# Patient Record
Sex: Female | Born: 1945 | Race: White | Hispanic: No | Marital: Married | Smoking: Never smoker
Health system: Southern US, Community
[De-identification: ages and names within clinical notes are randomized; demographics above are authoritative.]

## PROBLEM LIST (undated history)

## (undated) DIAGNOSIS — C4492 Squamous cell carcinoma of skin, unspecified: Secondary | ICD-10-CM

## (undated) DIAGNOSIS — M7750 Other enthesopathy of unspecified foot: Secondary | ICD-10-CM

## (undated) DIAGNOSIS — J45909 Unspecified asthma, uncomplicated: Secondary | ICD-10-CM

## (undated) DIAGNOSIS — M722 Plantar fascial fibromatosis: Secondary | ICD-10-CM

## (undated) DIAGNOSIS — T7840XA Allergy, unspecified, initial encounter: Secondary | ICD-10-CM

## (undated) DIAGNOSIS — I10 Essential (primary) hypertension: Secondary | ICD-10-CM

## (undated) DIAGNOSIS — E785 Hyperlipidemia, unspecified: Secondary | ICD-10-CM

## (undated) DIAGNOSIS — N3946 Mixed incontinence: Secondary | ICD-10-CM

## (undated) DIAGNOSIS — M1990 Unspecified osteoarthritis, unspecified site: Secondary | ICD-10-CM

## (undated) DIAGNOSIS — R739 Hyperglycemia, unspecified: Secondary | ICD-10-CM

## (undated) DIAGNOSIS — E669 Obesity, unspecified: Secondary | ICD-10-CM

## (undated) DIAGNOSIS — C439 Malignant melanoma of skin, unspecified: Secondary | ICD-10-CM

## (undated) HISTORY — DX: Hyperglycemia, unspecified: R73.9

## (undated) HISTORY — DX: Hyperlipidemia, unspecified: E78.5

## (undated) HISTORY — DX: Squamous cell carcinoma of skin, unspecified: C44.92

## (undated) HISTORY — PX: BREAST EXCISIONAL BIOPSY: SUR124

## (undated) HISTORY — DX: Allergy, unspecified, initial encounter: T78.40XA

## (undated) HISTORY — DX: Unspecified asthma, uncomplicated: J45.909

## (undated) HISTORY — DX: Other enthesopathy of unspecified foot and ankle: M77.50

## (undated) HISTORY — DX: Plantar fascial fibromatosis: M72.2

## (undated) HISTORY — DX: Unspecified osteoarthritis, unspecified site: M19.90

## (undated) HISTORY — DX: Obesity, unspecified: E66.9

## (undated) HISTORY — DX: Essential (primary) hypertension: I10

## (undated) HISTORY — DX: Malignant melanoma of skin, unspecified: C43.9

## (undated) HISTORY — DX: Mixed incontinence: N39.46

## (undated) HISTORY — PX: ABDOMINAL HYSTERECTOMY: SHX81

---

## 2000-01-17 ENCOUNTER — Other Ambulatory Visit: Admission: RE | Admit: 2000-01-17 | Discharge: 2000-01-17 | Payer: Self-pay | Admitting: Family Medicine

## 2003-01-16 ENCOUNTER — Encounter: Payer: Self-pay | Admitting: Family Medicine

## 2003-01-16 ENCOUNTER — Other Ambulatory Visit: Admission: RE | Admit: 2003-01-16 | Discharge: 2003-01-16 | Payer: Self-pay | Admitting: Family Medicine

## 2004-11-25 ENCOUNTER — Ambulatory Visit: Payer: Self-pay | Admitting: Family Medicine

## 2005-01-20 ENCOUNTER — Ambulatory Visit: Payer: Self-pay | Admitting: Family Medicine

## 2005-02-02 ENCOUNTER — Ambulatory Visit: Payer: Self-pay | Admitting: Family Medicine

## 2005-07-21 ENCOUNTER — Ambulatory Visit: Payer: Self-pay | Admitting: Family Medicine

## 2005-09-21 ENCOUNTER — Ambulatory Visit: Payer: Self-pay | Admitting: Family Medicine

## 2005-10-12 ENCOUNTER — Ambulatory Visit: Payer: Self-pay | Admitting: Family Medicine

## 2006-01-03 HISTORY — PX: TENDON REPAIR: SHX5111

## 2006-01-23 ENCOUNTER — Ambulatory Visit: Payer: Self-pay | Admitting: Family Medicine

## 2006-08-07 ENCOUNTER — Ambulatory Visit: Payer: Self-pay | Admitting: Family Medicine

## 2006-12-20 ENCOUNTER — Encounter: Payer: Self-pay | Admitting: Family Medicine

## 2006-12-20 DIAGNOSIS — I10 Essential (primary) hypertension: Secondary | ICD-10-CM

## 2006-12-20 DIAGNOSIS — N39498 Other specified urinary incontinence: Secondary | ICD-10-CM | POA: Insufficient documentation

## 2006-12-20 DIAGNOSIS — M722 Plantar fascial fibromatosis: Secondary | ICD-10-CM | POA: Insufficient documentation

## 2006-12-20 DIAGNOSIS — R42 Dizziness and giddiness: Secondary | ICD-10-CM

## 2007-01-23 ENCOUNTER — Other Ambulatory Visit: Admission: RE | Admit: 2007-01-23 | Discharge: 2007-01-23 | Payer: Self-pay | Admitting: Family Medicine

## 2007-01-23 ENCOUNTER — Encounter (INDEPENDENT_AMBULATORY_CARE_PROVIDER_SITE_OTHER): Payer: Self-pay | Admitting: *Deleted

## 2007-01-23 ENCOUNTER — Encounter: Payer: Self-pay | Admitting: Family Medicine

## 2007-01-23 ENCOUNTER — Ambulatory Visit: Payer: Self-pay | Admitting: Family Medicine

## 2007-01-23 DIAGNOSIS — J309 Allergic rhinitis, unspecified: Secondary | ICD-10-CM | POA: Insufficient documentation

## 2007-01-23 LAB — CONVERTED CEMR LAB: Pap Smear: NORMAL

## 2007-01-24 LAB — CONVERTED CEMR LAB
AST: 21 units/L (ref 0–37)
Albumin: 4.1 g/dL (ref 3.5–5.2)
Alkaline Phosphatase: 52 units/L (ref 39–117)
BUN: 20 mg/dL (ref 6–23)
CO2: 33 meq/L — ABNORMAL HIGH (ref 19–32)
Calcium: 10.1 mg/dL (ref 8.4–10.5)
Chloride: 103 meq/L (ref 96–112)
Cholesterol: 205 mg/dL (ref 0–200)
Creatinine, Ser: 1 mg/dL (ref 0.4–1.2)
Direct LDL: 141.9 mg/dL
HCT: 40.2 % (ref 36.0–46.0)
Hemoglobin: 13.9 g/dL (ref 12.0–15.0)
MCHC: 34.5 g/dL (ref 30.0–36.0)
Monocytes Relative: 7.6 % (ref 3.0–11.0)
Neutrophils Relative %: 61.4 % (ref 43.0–77.0)
RBC: 4.38 M/uL (ref 3.87–5.11)
RDW: 11.9 % (ref 11.5–14.6)
Sodium: 141 meq/L (ref 135–145)
Total Bilirubin: 1 mg/dL (ref 0.3–1.2)
Total CHOL/HDL Ratio: 5
Total Protein: 7.1 g/dL (ref 6.0–8.3)

## 2007-01-30 ENCOUNTER — Encounter (INDEPENDENT_AMBULATORY_CARE_PROVIDER_SITE_OTHER): Payer: Self-pay | Admitting: *Deleted

## 2007-02-06 ENCOUNTER — Ambulatory Visit: Payer: Self-pay | Admitting: Family Medicine

## 2007-02-06 ENCOUNTER — Encounter: Payer: Self-pay | Admitting: Family Medicine

## 2007-02-08 ENCOUNTER — Encounter (INDEPENDENT_AMBULATORY_CARE_PROVIDER_SITE_OTHER): Payer: Self-pay | Admitting: *Deleted

## 2007-05-17 ENCOUNTER — Ambulatory Visit: Payer: Self-pay | Admitting: Family Medicine

## 2007-05-17 DIAGNOSIS — R739 Hyperglycemia, unspecified: Secondary | ICD-10-CM | POA: Insufficient documentation

## 2007-05-17 DIAGNOSIS — E7800 Pure hypercholesterolemia, unspecified: Secondary | ICD-10-CM

## 2007-05-21 LAB — CONVERTED CEMR LAB
ALT: 24 units/L (ref 0–35)
AST: 22 units/L (ref 0–37)
Cholesterol: 182 mg/dL (ref 0–200)
HDL: 39 mg/dL (ref >39.0)
Hgb A1c MFr Bld: 6 % (ref 4.6–6.0)

## 2007-08-29 ENCOUNTER — Telehealth: Payer: Self-pay | Admitting: Family Medicine

## 2007-10-26 ENCOUNTER — Telehealth: Payer: Self-pay | Admitting: Family Medicine

## 2008-01-30 ENCOUNTER — Ambulatory Visit: Payer: Self-pay | Admitting: Family Medicine

## 2008-02-05 ENCOUNTER — Ambulatory Visit: Payer: Self-pay | Admitting: Family Medicine

## 2008-02-08 LAB — CONVERTED CEMR LAB
Albumin: 4.2 g/dL (ref 3.5–5.2)
Alkaline Phosphatase: 49 units/L (ref 39–117)
Calcium: 10.3 mg/dL (ref 8.4–10.5)
Cholesterol: 181 mg/dL (ref 0–200)
Eosinophils Absolute: 0.2 10*3/uL (ref 0.0–0.7)
Eosinophils Relative: 4.7 % (ref 0.0–5.0)
Glucose, Bld: 109 mg/dL — ABNORMAL HIGH (ref 70–99)
HDL: 38.2 mg/dL — ABNORMAL LOW (ref >39.0)
Hgb A1c MFr Bld: 6.1 % — ABNORMAL HIGH (ref 4.6–6.0)
Neutro Abs: 3.2 10*3/uL (ref 1.4–7.7)
Neutrophils Relative %: 59.2 % (ref 43.0–77.0)
Platelets: 271 10*3/uL (ref 150–400)
Potassium: 3.8 meq/L (ref 3.5–5.1)
RBC: 4.29 M/uL (ref 3.87–5.11)
Sodium: 141 meq/L (ref 135–145)
TSH: 2.57 microintl units/mL (ref 0.35–5.50)
Total Bilirubin: 1.3 mg/dL — ABNORMAL HIGH (ref 0.3–1.2)
Triglycerides: 94 mg/dL (ref 0–149)

## 2008-02-22 ENCOUNTER — Ambulatory Visit: Payer: Self-pay | Admitting: Family Medicine

## 2008-02-22 LAB — CONVERTED CEMR LAB
OCCULT 1: NEGATIVE
OCCULT 2: NEGATIVE

## 2008-02-26 ENCOUNTER — Encounter: Payer: Self-pay | Admitting: Family Medicine

## 2008-02-26 ENCOUNTER — Ambulatory Visit: Payer: Self-pay | Admitting: Family Medicine

## 2008-03-11 ENCOUNTER — Encounter (INDEPENDENT_AMBULATORY_CARE_PROVIDER_SITE_OTHER): Payer: Self-pay | Admitting: *Deleted

## 2008-05-13 ENCOUNTER — Telehealth: Payer: Self-pay | Admitting: Family Medicine

## 2008-07-08 ENCOUNTER — Ambulatory Visit: Payer: Self-pay | Admitting: Family Medicine

## 2008-07-09 LAB — CONVERTED CEMR LAB
Cholesterol: 188 mg/dL (ref 0–200)
HDL: 42.2 mg/dL (ref >39.0)
LDL Cholesterol: 129 mg/dL — ABNORMAL HIGH (ref 0–99)
Total CHOL/HDL Ratio: 4.5
Triglycerides: 82 mg/dL (ref 0–149)
VLDL: 16 mg/dL (ref 0–40)

## 2008-11-25 ENCOUNTER — Telehealth: Payer: Self-pay | Admitting: Family Medicine

## 2009-01-06 ENCOUNTER — Ambulatory Visit: Payer: Self-pay | Admitting: Family Medicine

## 2009-02-05 ENCOUNTER — Telehealth: Payer: Self-pay | Admitting: Family Medicine

## 2009-03-20 ENCOUNTER — Ambulatory Visit: Payer: Self-pay | Admitting: Family Medicine

## 2009-03-20 LAB — CONVERTED CEMR LAB
ALT: 20 units/L (ref 0–35)
AST: 24 units/L (ref 0–37)
Alkaline Phosphatase: 54 units/L (ref 39–117)
Basophils Relative: 0.5 % (ref 0.0–3.0)
Bilirubin, Direct: 0.1 mg/dL (ref 0.0–0.3)
Chloride: 101 meq/L (ref 96–112)
Creatinine, Ser: 1.1 mg/dL (ref 0.4–1.2)
Eosinophils Relative: 2.9 % (ref 0.0–5.0)
LDL Cholesterol: 125 mg/dL — ABNORMAL HIGH (ref 0–99)
Lymphocytes Relative: 22 % (ref 12.0–46.0)
MCV: 90.8 fL (ref 78.0–100.0)
Monocytes Absolute: 0.5 10*3/uL (ref 0.1–1.0)
Neutrophils Relative %: 67.1 % (ref 43.0–77.0)
RBC: 4.5 M/uL (ref 3.87–5.11)
Total CHOL/HDL Ratio: 4
Total Protein: 7.4 g/dL (ref 6.0–8.3)
Triglycerides: 72 mg/dL (ref 0.0–149.0)
WBC: 6.5 10*3/uL (ref 4.5–10.5)

## 2009-03-24 ENCOUNTER — Ambulatory Visit: Payer: Self-pay | Admitting: Family Medicine

## 2009-03-24 ENCOUNTER — Encounter: Payer: Self-pay | Admitting: Family Medicine

## 2009-03-26 ENCOUNTER — Encounter (INDEPENDENT_AMBULATORY_CARE_PROVIDER_SITE_OTHER): Payer: Self-pay | Admitting: *Deleted

## 2009-06-24 ENCOUNTER — Ambulatory Visit: Payer: Self-pay | Admitting: Family Medicine

## 2009-06-25 LAB — CONVERTED CEMR LAB
Cholesterol: 174 mg/dL (ref 0–200)
HDL: 37.9 mg/dL — ABNORMAL LOW (ref >39.00)
LDL Cholesterol: 120 mg/dL — ABNORMAL HIGH (ref 0–99)
Triglycerides: 83 mg/dL (ref 0.0–149.0)
VLDL: 16.6 mg/dL (ref 0.0–40.0)

## 2009-07-10 ENCOUNTER — Ambulatory Visit: Payer: Self-pay | Admitting: Family Medicine

## 2009-08-21 ENCOUNTER — Telehealth: Payer: Self-pay | Admitting: Family Medicine

## 2009-08-21 ENCOUNTER — Ambulatory Visit: Payer: Self-pay | Admitting: Family Medicine

## 2009-08-24 LAB — CONVERTED CEMR LAB
ALT: 21 units/L (ref 0–35)
AST: 23 units/L (ref 0–37)
LDL Cholesterol: 68 mg/dL (ref 0–99)
VLDL: 10.6 mg/dL (ref 0.0–40.0)

## 2009-12-02 ENCOUNTER — Telehealth: Payer: Self-pay | Admitting: Family Medicine

## 2010-03-15 ENCOUNTER — Ambulatory Visit: Payer: Self-pay | Admitting: Family Medicine

## 2010-03-15 ENCOUNTER — Telehealth (INDEPENDENT_AMBULATORY_CARE_PROVIDER_SITE_OTHER): Payer: Self-pay | Admitting: *Deleted

## 2010-03-15 LAB — CONVERTED CEMR LAB
AST: 18 units/L (ref 0–37)
Albumin: 4.1 g/dL (ref 3.5–5.2)
BUN: 20 mg/dL (ref 6–23)
Basophils Relative: 0.6 % (ref 0.0–3.0)
CO2: 35 meq/L — ABNORMAL HIGH (ref 19–32)
Cholesterol: 125 mg/dL (ref 0–200)
Creatinine, Ser: 1.1 mg/dL (ref 0.4–1.2)
Eosinophils Absolute: 0.3 10*3/uL (ref 0.0–0.7)
GFR calc non Af Amer: 53.45 mL/min (ref >60)
HCT: 37.7 % (ref 36.0–46.0)
LDL Cholesterol: 70 mg/dL (ref 0–99)
Lymphs Abs: 1.6 10*3/uL (ref 0.7–4.0)
MCHC: 34.4 g/dL (ref 30.0–36.0)
MCV: 93.4 fL (ref 78.0–100.0)
Monocytes Absolute: 0.4 10*3/uL (ref 0.1–1.0)
Neutro Abs: 3.2 10*3/uL (ref 1.4–7.7)
Neutrophils Relative %: 57.4 % (ref 43.0–77.0)
Phosphorus: 3.5 mg/dL (ref 2.3–4.6)
RBC: 4.04 M/uL (ref 3.87–5.11)
Total Protein: 6.7 g/dL (ref 6.0–8.3)
Triglycerides: 77 mg/dL (ref 0.0–149.0)

## 2010-03-22 ENCOUNTER — Ambulatory Visit: Payer: Self-pay | Admitting: Family Medicine

## 2010-03-25 ENCOUNTER — Encounter: Payer: Self-pay | Admitting: Family Medicine

## 2010-03-25 ENCOUNTER — Ambulatory Visit: Payer: Self-pay | Admitting: Internal Medicine

## 2010-03-29 ENCOUNTER — Ambulatory Visit: Payer: Self-pay | Admitting: Family Medicine

## 2010-03-29 LAB — FECAL OCCULT BLOOD, GUAIAC: Fecal Occult Blood: NEGATIVE

## 2010-03-30 ENCOUNTER — Ambulatory Visit: Payer: Self-pay | Admitting: Family Medicine

## 2010-03-30 ENCOUNTER — Encounter: Payer: Self-pay | Admitting: Family Medicine

## 2010-03-30 LAB — CONVERTED CEMR LAB: Fecal Occult Bld: NEGATIVE

## 2010-04-05 ENCOUNTER — Telehealth: Payer: Self-pay | Admitting: Family Medicine

## 2010-04-06 ENCOUNTER — Ambulatory Visit: Payer: Self-pay | Admitting: Family Medicine

## 2010-04-06 ENCOUNTER — Encounter (INDEPENDENT_AMBULATORY_CARE_PROVIDER_SITE_OTHER): Payer: Self-pay | Admitting: *Deleted

## 2010-04-08 LAB — CONVERTED CEMR LAB
Basophils Absolute: 0 10*3/uL (ref 0.0–0.1)
Eosinophils Absolute: 0.2 10*3/uL (ref 0.0–0.7)
HCT: 39 % (ref 36.0–46.0)
Hemoglobin: 13.4 g/dL (ref 12.0–15.0)
Lymphs Abs: 1.9 10*3/uL (ref 0.7–4.0)
MCHC: 34.3 g/dL (ref 30.0–36.0)
MCV: 92.7 fL (ref 78.0–100.0)
Monocytes Absolute: 0.7 10*3/uL (ref 0.1–1.0)
Neutro Abs: 7.4 10*3/uL (ref 1.4–7.7)
Platelets: 230 10*3/uL (ref 150.0–400.0)
RDW: 12.8 % (ref 11.5–14.6)
Uric Acid, Serum: 7.7 mg/dL — ABNORMAL HIGH (ref 2.4–7.0)

## 2010-05-03 ENCOUNTER — Ambulatory Visit: Payer: Self-pay | Admitting: Family Medicine

## 2010-05-03 ENCOUNTER — Encounter: Payer: Self-pay | Admitting: Family Medicine

## 2010-05-05 ENCOUNTER — Telehealth: Payer: Self-pay | Admitting: Family Medicine

## 2010-05-12 ENCOUNTER — Telehealth: Payer: Self-pay | Admitting: Family Medicine

## 2010-05-18 ENCOUNTER — Encounter: Payer: Self-pay | Admitting: Family Medicine

## 2010-05-24 ENCOUNTER — Telehealth: Payer: Self-pay | Admitting: Family Medicine

## 2010-09-14 ENCOUNTER — Telehealth: Payer: Self-pay | Admitting: Family Medicine

## 2010-09-17 ENCOUNTER — Encounter: Payer: Self-pay | Admitting: Family Medicine

## 2010-09-22 ENCOUNTER — Encounter: Payer: Self-pay | Admitting: Family Medicine

## 2010-09-29 ENCOUNTER — Encounter: Payer: Self-pay | Admitting: Family Medicine

## 2010-09-30 ENCOUNTER — Telehealth: Payer: Self-pay | Admitting: Family Medicine

## 2010-10-05 NOTE — Progress Notes (Signed)
----   Converted from flag ---- ---- 03/14/2010 8:51 PM, Colon Flattery Tower MD wrote: please check renal / hepatic/ lipid/ tsh/ cbc with diff / AIC for 401.1, 272 and hyperglycemia -thanks   ---- 03/12/2010 9:16 AM, Liane Comber CMA (AAMA) wrote: Pt is scheduled for cpx labs Monday, what labs to draw and dx codes? Thanks Tasha ------------------------------

## 2010-10-05 NOTE — Progress Notes (Signed)
Summary: Toe pain  Phone Note Call from Patient Call back at [phone]   Caller: Patient Call For: Judith Part MD Summary of Call: Patient called c/o problems with big toe on left foot. The toe is red, swollen and painful to walk on. Patient wants to know if you can work her in today. There are no appointments available today. Patient has been to [facility] in the past, but it has been over 5 years. Initial call taken by: Sydell Axon LPN,  April 05, 2010 8:54 AM  Follow-up for Phone Call        that sounds as though it may be gout  let me know if any fever or other symptoms  can she take nsaids? -- I see aleve on her allergy list - please find out rxn  Follow-up by: Judith Part MD,  April 05, 2010 11:28 AM  Additional Follow-up for Phone Call Additional follow up Details #1::        Patient notified as instructed by telephone. Pt sais she has not run a fever but her lt big toe feels hot to the touch.. No known injury. Pt said she cannot take Aleve or Naproxen (they make her feel like she is having a heart attack the indigestion pain is so terible.). Pt has been taking Ibuprofen 200mg  take 3-4 every 8 hrs as needed. Pt said Ibuprofen helps her rest at night but does not take away the pain in the toe when she tries to walk. Pt 's grandfather and pt's son has had hx of gout. [facility] if pharmacy needed. Pt will wait to hear what Dr Milinda Antis thinks.Lewanda Rife LPN  April 05, 2010 12:29 PM     Additional Follow-up for Phone Call Additional follow up Details #2::    I am going to tx her with colchicine( watch out for stomach upset)- then get her in for f/u with first avail physician tomorrow (please schedule)  if fever or any other symptoms in the meantime- may need to go to UC  px written on EMR for call in  Follow-up by: Judith Part MD,  April 05, 2010 12:43 PM  Additional Follow-up for Phone Call Additional follow up Details #3:: Details for  Additional Follow-up Action Taken: Patient notified as instructed by telephone. Pt has appt to see DR Milinda Antis 04/06/10 at 9:15am. [facility]ication [facility]oned to [facility] as instructed. Lewanda Rife LPN  April 05, 2010 12:56 PM   New/Updated Medications: COLCRYS 0.6 MG TABS (COLCHICINE) take 2 now with food and then 1 by mouth with food two times a day as needed gout pain Prescriptions: COLCRYS 0.6 MG TABS (COLCHICINE) take 2 now with food and then 1 by mouth with food two times a day as needed gout pain  #10 x 0   Entered and Authorized by:   Judith Part MD   Signed by:   Lewanda Rife LPN on 13/24/4010   Method used:   Telephoned to ...       [facility] [address] [phone]* (retail)       [address] [place], [place]  [id number]       Ph: [phone]       Fax: [phone]   RxID:   [id number]

## 2010-10-05 NOTE — Progress Notes (Signed)
Summary: nexium is working Hospital doctor from Patient Call back at [employer] [phone]   Caller: Patient Call For: Judith Part MD Summary of Call: Patient says that the nexium is working great. She says that she feels so much better. She is now asking if you want her to contine to take it. If so she will [facility] [address] Please call patient if rx called in.  Initial call taken by: Melody Comas,  May 05, 2010 4:18 PM  Follow-up for Phone Call        I'm glad continue to take it  px written on EMR for call in  Follow-up by: Judith Part MD,  May 05, 2010 4:24 PM  Additional Follow-up for Phone Call Additional follow up Details #1::        Patient notified as instructed by telephone. [facility] pharmacy as instructed. Lewanda Rife LPN  May 05, 2010 4:52 PM     New/Updated Medications: NEXIUM 40 MG CPDR (ESOMEPRAZOLE MAGNESIUM) 1 by mouth once daily Prescriptions: NEXIUM 40 MG CPDR (ESOMEPRAZOLE MAGNESIUM) 1 by mouth once daily  #30 x 11   Entered by:   Lewanda Rife LPN   Authorized by:   Judith Part MD   Signed by:   Lewanda Rife LPN on 09/81/1914   Method used:   Electronically to        [id number]       [address]       [place], [place]  [address]       Ph: [phone]       Fax: [phone]   RxID:   [id number]

## 2010-10-05 NOTE — Medication Information (Signed)
Summary: Prior Authorization & Approval for Nexium/[employer]  Prior Authorization & Approval for Nexium/[employer]BS   Imported By: Lanelle Bal 05/24/2010 13:30:44  _____________________________________________________________________  External Attachment:    Type:   Image     Comment:   External Document

## 2010-10-05 NOTE — Letter (Signed)
Summary: Results Follow up Letter  [place] at [facility]  [address] [place], [place] [address]   Phone: [phone]  Fax: [phone]    04/06/2010 MRN: [id number]    PIERRE CUMPTON [address] [place], [place]  [address]     Dear Ms. Kohls,  The following are the results of your recent test(s):  Test         Result    Pap Smear:        Normal _____  Not Normal _____ Comments: ______________________________________________________ Cholesterol: LDL(Bad cholesterol):         Your goal is less than:         HDL (Good cholesterol):       Your goal is more than: Comments:  ______________________________________________________ Mammogram:        Normal __x___  Not Normal _____ Comments:  Next mammogram due in one year __________________________________________________________________ Hemoccult:        Normal _____  Not normal _______ Comments:    _____________________________________________________________________ Other Tests:    We routinely do not discuss normal results over the telephone.  If you desire a copy of the results, or you have any questions about this information we can discuss them at your next office visit.   Sincerely,  Roxy Manns, MD

## 2010-10-05 NOTE — Miscellaneous (Signed)
Summary: Controlled Substance Agreement  Controlled Substance Agreement   Imported By: Lanelle Bal 05/12/2010 11:17:39  _____________________________________________________________________  External Attachment:    Type:   Image     Comment:   External Document

## 2010-10-05 NOTE — Progress Notes (Signed)
Summary: prior auth for nexium  Phone Note Call from Patient   Caller: [employer] Call For: Judith Part MD Summary of Call: Prior Auth is required for Nexium. Form is on your desk.  Initial call taken by: Melody Comas,  May 12, 2010 4:52 PM  Follow-up for Phone Call        form done and in nurse in box  Follow-up by: Judith Part MD,  May 12, 2010 5:25 PM  Additional Follow-up for Phone Call Additional follow up Details #1::        Completed form faxed to [phone] as instructed. Form given to [place].Lewanda Rife LPN  May 13, 2010 11:01 AM      Appended Document: prior auth for nexium Prior auth given for nexium, approval letter placed on doctor's desk for signature and scanning.

## 2010-10-05 NOTE — Assessment & Plan Note (Signed)
Summary: LT BIG TOE,RED,SWOLLEN WARM TO TOUCH AND PAINFUL/RI   Vital Signs:  Patient profile:   65 year old female Height:      62 inches Temp:     98.1 degrees F oral Pulse rate:   64 / minute Pulse rhythm:   regular BP sitting:   128 / 76  (left arm) Cuff size:   large  Vitals Entered By: Lewanda Rife LPN (April 06, 2010 9:22 AM) CC: Lt big toe red, swollen, painful and warm to the touch. this AM foot also hurts   History of Present Illness: pain started tuesday afternoon - noticed some L foot pain switched back to old shoes - got worse and worse  swelling and redness started 2-3 days ago  no ingrown nail or pus or fever  toe feels warm   now lateral foot is sore as well   took colchicine -- 2 at onset and 1 last night  ? if it helped - could not tell   no shellfish or aged foods  did eat hot dog at [employer] is on hct   does drink a fair amt of water   no injury to this   Allergies: 1)  ! * Aleve  Past History:  Past Medical History: Last updated: 01/30/2008 Hypertension obesity  hyperlipidemia hyperglycemia  all rhinitis  mixed incontinence plantar fasciiitis- with significant disability tendonitis L foot-- chronic foot pain  Past Surgical History: Last updated: 12/20/2006 Hysterectomy- fibroids, ovaries intact Dexa- normal (01/2001) Tendon rupture left foot (01/2006)  Family History: Last updated: 01/23/2007 mother MI, ?thyroid probs father with lung ca  Social History: Last updated: 01/30/2008 non smoker cannot exercise to chronic foot pain  no alcohol   Risk Factors: Smoking Status: never (12/20/2006)  Review of Systems General:  Denies chills, fatigue, fever, loss of appetite, and malaise. Eyes:  Denies blurring and eye irritation. CV:  Denies chest pain or discomfort, lightheadness, palpitations, and shortness of breath with exertion. Resp:  Denies cough, shortness of breath, and wheezing. GI:  Denies abdominal pain, change in bowel  habits, gas, and indigestion. MS:  Complains of joint pain, joint redness, and joint swelling; denies muscle aches and cramps. Derm:  Denies itching, lesion(s), poor wound healing, and rash. Neuro:  Denies numbness and tingling. Psych:  Denies anxiety and depression. Heme:  Denies abnormal bruising and bleeding.  Physical Exam  General:  overwt but well appearing Head:  normocephalic, atraumatic, and no abnormalities observed.   Eyes:  vision grossly intact, pupils equal, pupils round, and pupils reactive to light.  no conjunctival pallor, injection or icterus  Nose:  no nasal discharge.   Mouth:  pharynx pink and moist.   Neck:  supple with full rom and no masses or thyromegally, no JVD or carotid bruit  Chest Wall:  No deformities, masses, or tenderness noted. Lungs:  Normal respiratory effort, chest expands symmetrically. Lungs are clear to auscultation, no crackles or wheezes. Heart:  Normal rate and regular rhythm. S1 and S2 normal without gallop, murmur, click, rub or other extra sounds. Msk:  L foot - swelling over first MTP and also 5th MTP diffuse tenderness over those areas warm and red no drainage or ingrown nails   Pulses:  R and L carotid,radial,femoral,dorsalis pedis and posterior tibial pulses are full and equal bilaterally Extremities:  see above Neurologic:  sensation intact to light touch, gait normal, and DTRs symmetrical and normal.   Skin:  no rashes  Cervical Nodes:  No  lymphadenopathy noted Psych:  normal affect, talkative and pleasant    Impression & Recommendations:  Problem # 1:  FOOT PAIN (ICD-729.5) Assessment New suspect gout with toe and foot pain/ redness and swelling x ray today- also want to r/o stress fx  lab cbc and uric acid  cocrys as needed vicodin as needed severe pain elevate/ cool compress if tolerated  Orders: Venipuncture (32440) TLB-CBC Platelet - w/Differential (85025-CBCD) TLB-Uric Acid, Blood (84550-URIC) T-Foot Left Min 3  Views (73630TC)  Complete Medication List: 1)  Flonase 50 Mcg/act Susp (Fluticasone propionate) .... 2 sprays iin each nostril daily 2)  Diovan Hct 160-12.5 Mg Tabs (Valsartan-hydrochlorothiazide) .Marland Kitchen.. 1 by mouth once daily 3)  Vesicare 10 Mg Tabs (Solifenacin succinate) .Marland Kitchen.. 1 by mouth once daily 4)  Hydrochlorothiazide 25 Mg Tabs (Hydrochlorothiazide) .Marland Kitchen.. 1 by mouth once daily 5)  Lipitor 10 Mg Tabs (Atorvastatin calcium) .Marland Kitchen.. 1 by mouth once daily 6)  Mobic 7.5 Mg Tabs (Meloxicam) .Marland Kitchen.. 1 by mouth once daily with food 7)  Multivitamins Tabs (Multiple vitamin) .... Take 1 tablet by mouth once a day 8)  Calcium With Vitamin D ?mg  .... Take 1 tablet by mouth once a day 9)  Colcrys 0.6 Mg Tabs (Colchicine) .... Take 2 now with food and then 1 by mouth with food two times a day as needed gout pain 10)  Aspirin 81 Mg Tabs (Aspirin) .... Take 1 tablet by mouth once a day 11)  Vicodin 5-500 Mg Tabs (Hydrocodone-acetaminophen) .Marland Kitchen.. 1 by mouth up to every 4 hours as needed severe pain  Patient Instructions: 1)  x ray today  2)  labs today  3)  could be gout or stress fracture 4)  continue colchicine as needed - stop for GI upset  5)  vicodin for severe pain 6)  drink lots of water  7)  update me if worse  8)  I will update you with results  Prescriptions: VICODIN 5-500 MG TABS (HYDROCODONE-ACETAMINOPHEN) 1 by mouth up to every 4 hours as needed severe pain  #15 x 0   Entered and Authorized by:   Judith Part MD   Signed by:   Judith Part MD on 04/06/2010   Method used:   Print then Give to Patient   RxID:   [id number]   Current Allergies (reviewed today): ! * ALEVE

## 2010-10-05 NOTE — Progress Notes (Signed)
Summary: needs written script for mobic  Phone Note Refill Request Call back at [phone] Message from:  Patient  Refills Requested: Medication #1:  MOBIC 7.5 MG TABS 1 by mouth once daily with food Pt is asking for a written script for mail order, please call when ready and she will pick up.  Initial call taken by: Lowella Petties CMA,  December 02, 2009 1:59 PM  Follow-up for Phone Call        printed in put in nurse in box for pickup  Follow-up by: Judith Part MD,  December 02, 2009 5:06 PM  Additional Follow-up for Phone Call Additional follow up Details #1::        Patient notified as instructed by telephone. Prescription left at front desk. Lewanda Rife LPN  December 02, 2009 5:11 PM     New/Updated Medications: MOBIC 7.5 MG TABS (MELOXICAM) 1 by mouth once daily with food Prescriptions: MOBIC 7.5 MG TABS (MELOXICAM) 1 by mouth once daily with food  #90 x 3   Entered and Authorized by:   Judith Part MD   Signed by:   Judith Part MD on 12/02/2009   Method used:   Print then Give to Patient   RxID:   [id number]

## 2010-10-05 NOTE — Letter (Signed)
Summary: [facility]: Immunoassay Fecal Occult Blood (iFOB) Order Form  [place] [facility]  [address] [place], [place] [address]   Phone: [phone]  Fax: [phone]      [place] [facility]: Immunoassay Fecal Occult Blood (iFOB) Order Form   March 22, 2010 MRN: [id number]   Andrea Peters 01/28/1946   Physicican Name:__Tower_______________________  Diagnosis Code:_______V76.51___________________      Judith Part MD

## 2010-10-05 NOTE — Assessment & Plan Note (Signed)
Summary: PAIN AROUND RIBS   Vital Signs:  Patient profile:   65 year old female Height:      62 inches Weight:      193.25 pounds BMI:     35.47 Temp:     97.9 degrees F oral Pulse rate:   68 / minute Pulse rhythm:   regular BP sitting:   138 / 80  (left arm) Cuff size:   large  Vitals Entered By: Lewanda Rife LPN (May 03, 2010 12:01 PM) CC: pain at ribs on both sides for approx 1 week. No known injury even hurts to wear a bra, Headache   History of Present Illness: symptoms started less than a week ago  feels like extreme indigestion -- "all around rib cage"-- all under the breasts  radiates in back to the back of her shoulders  has not been taking meloxican 3 advil will ease it off (no help with pepcid and mylanta)  even hurts in breasts  worse in afternoon and eve had to stop wearing bra   throat feels fine  is very tender all around ribs -- to the touch -- in certain areas  is having a lot of burping and belching -- ? if it gives her some relief   no rash  no new activity   husband had MI  stressful is doing great  no heavy lifting    Allergies: 1)  ! * Aleve  Past History:  Past Medical History: Last updated: 01/30/2008 Hypertension obesity  hyperlipidemia hyperglycemia  all rhinitis  mixed incontinence plantar fasciiitis- with significant disability tendonitis L foot-- chronic foot pain  Past Surgical History: Last updated: 12/20/2006 Hysterectomy- fibroids, ovaries intact Dexa- normal (01/2001) Tendon rupture left foot (01/2006)  Family History: Last updated: 01/23/2007 mother MI, ?thyroid probs father with lung ca  Social History: Last updated: 01/30/2008 non smoker cannot exercise to chronic foot pain  no alcohol   Risk Factors: Smoking Status: never (12/20/2006)  Review of Systems General:  Complains of fatigue; denies chills, fever, loss of appetite, and malaise. Eyes:  Denies blurring. ENT:  Denies sore throat. CV:  Denies  chest pain or discomfort, lightheadness, and palpitations. Resp:  Denies cough, pleuritic, shortness of breath, and wheezing. GI:  Complains of gas and indigestion; denies bloody stools, change in bowel habits, nausea, and vomiting. GU:  Denies dysuria and urinary frequency. MS:  Complains of stiffness; denies joint redness, joint swelling, and muscle weakness. Derm:  Denies itching, lesion(s), poor wound healing, and rash. Neuro:  Denies numbness, tingling, and weakness. Psych:  stressed but doing ok . Endo:  Denies cold intolerance, excessive thirst, excessive urination, and heat intolerance. Heme:  Denies abnormal bruising and bleeding.  Physical Exam  General:  overwt but well appearing Head:  normocephalic, atraumatic, and no abnormalities observed.   Eyes:  vision grossly intact, pupils equal, pupils round, and pupils reactive to light.  no conjunctival pallor, injection or icterus  Ears:  R ear normal and L ear normal.   Nose:  no nasal discharge.   Mouth:  pharynx pink and moist.   Neck:  supple with full rom and no masses or thyromegally, no JVD or carotid bruit  Chest Wall:  tender bilat ant/lat ribs without crepitice or skin change Lungs:  Normal respiratory effort, chest expands symmetrically. Lungs are clear to auscultation, no crackles or wheezes. (no pain on deep inspiration) Heart:  Normal rate and regular rhythm. S1 and S2 normal without gallop, murmur, click, rub or  other extra sounds. Abdomen:  tender epigastrium without rebound or gaurding soft, normal bowel sounds, no distention, no masses, no hepatomegaly, and no splenomegaly.   Msk:  no CVA tenderness  no acute joint changes  Pulses:  R and L carotid,radial,femoral,dorsalis pedis and posterior tibial pulses are full and equal bilaterally Extremities:  No clubbing, cyanosis, edema, or deformity noted with normal full range of motion of all joints.   Neurologic:  strength normal in all extremities, sensation intact  to light touch, gait normal, and DTRs symmetrical and normal.   Skin:  Intact without suspicious lesions or rashes Cervical Nodes:  No lymphadenopathy noted Inguinal Nodes:  No significant adenopathy Psych:  normal affect, talkative and pleasant    Impression & Recommendations:  Problem # 1:  GASTRITIS (ICD-535.50) Assessment New epigastric pain with some burning -- after severe stress suspect gastritis  trial of nexium- samples given- and update later in week  adv to avoid nsaid  disc what to avoid in diet   Problem # 2:  CHEST WALL PAIN, ACUTE (BMW-413.24) Assessment: New bilat lower rib pain and tenderness ? costochondritis or other ? if rel to above cxr and update adv to use warm compress prn  Her updated medication list for this problem includes:    Mobic 7.5 Mg Tabs (Meloxicam) .Marland Kitchen... 1 by mouth once daily with food as needed.    Aspirin 81 Mg Tabs (Aspirin) .Marland Kitchen... Take 1 tablet by mouth once a day  Orders: T-2 View CXR (71020TC)  Complete Medication List: 1)  Flonase 50 Mcg/act Susp (Fluticasone propionate) .... 2 sprays iin each nostril daily 2)  Diovan Hct 160-12.5 Mg Tabs (Valsartan-hydrochlorothiazide) .Marland Kitchen.. 1 by mouth once daily 3)  Vesicare 10 Mg Tabs (Solifenacin succinate) .Marland Kitchen.. 1 by mouth once daily 4)  Hydrochlorothiazide 25 Mg Tabs (Hydrochlorothiazide) .Marland Kitchen.. 1 by mouth once daily 5)  Lipitor 10 Mg Tabs (Atorvastatin calcium) .Marland Kitchen.. 1 by mouth once daily 6)  Mobic 7.5 Mg Tabs (Meloxicam) .Marland Kitchen.. 1 by mouth once daily with food as needed. 7)  Multivitamins Tabs (Multiple vitamin) .... Take 1 tablet by mouth once a day 8)  Calcium With Vitamin D ?mg  .... Take 1 tablet by mouth once a day 9)  Colcrys 0.6 Mg Tabs (Colchicine) .... Take 2 now with food and then 1 by mouth with food two times a day as needed gout pain 10)  Aspirin 81 Mg Tabs (Aspirin) .... Take 1 tablet by mouth once a day 11)  Vicodin 5-500 Mg Tabs (Hydrocodone-acetaminophen) .Marland Kitchen.. 1 by mouth up to every  4 hours as needed severe pain 12)  Vitamin E ?mg  .... Take 1 tablet by mouth once a day 13)  Vitamin C ?mg  .... Take 1 tablet by mouth once a day  Patient Instructions: 1)  take nexium 40 mg once daily -- first dose when you get home and then once daily in am  2)  chest x ray on way out  3)  stay away from nsaid -- like advil and meloxicam  4)  avoid spicy food and caffiene 5)  for rib pain-- checking chest x ray today  6)  update me (call) towards end of week with how symptoms are (earlier if worse)   Current Allergies (reviewed today): ! * ALEVE

## 2010-10-05 NOTE — Progress Notes (Signed)
Summary: refill request for colcrys  Phone Note Refill Request Message from:  Patient  Refills Requested: Medication #1:  COLCRYS 0.6 MG TABS take 2 now with food and then 1 by mouth with food two times a day as needed gout pain Please send 3 month supply to [facility].  Initial call taken by: Lowella Petties CMA,  May 24, 2010 11:42 AM  Follow-up for Phone Call        px written on EMR for call in  Follow-up by: Judith Part MD,  May 24, 2010 1:29 PM  Additional Follow-up for Phone Call Additional follow up Details #1::        Patient notified as instructed by telephone. Medication faxed to [facility] pharmacy as instructed.Lewanda Rife LPN  May 24, 2010 2:22 PM     Prescriptions: COLCRYS 0.6 MG TABS (COLCHICINE) take 2 now with food and then 1 by mouth with food two times a day as needed gout pain  #20 x 0   Entered by:   Lewanda Rife LPN   Authorized by:   Judith Part MD   Signed by:   Lewanda Rife LPN on 56/38/7564   Method used:   Faxed to ...       [facility] (mail-order)             , [place]         Ph: [phone]       Fax: [phone]   RxID:   [id number]

## 2010-10-05 NOTE — Letter (Signed)
Summary: Results Follow up Letter  [place] at [facility]  [address] [place], [place] [address]   Phone: [phone]  Fax: [phone]    03/30/2010 MRN: [id number]    Andrea Peters [address] [place], [place]  [address]    Dear [name],  The following are the results of your recent test(s):  Test         Result    Pap Smear:        Normal _____  Not Normal _____ Comments: ______________________________________________________ Cholesterol: LDL(Bad cholesterol):         Your goal is less than:         HDL (Good cholesterol):       Your goal is more than: Comments:  ______________________________________________________ Mammogram:        Normal _____  Not Normal _____ Comments:  ___________________________________________________________________ Hemoccult:        Normal __X___  Not normal _______ Comments:    _____________________________________________________________________ Other Tests:    We routinely do not discuss normal results over the telephone.  If you desire a copy of the results, or you have any questions about this information we can discuss them at your next office visit.   Sincerely,    Idamae Schuller Harryette Shuart,MD  MT/ri

## 2010-10-05 NOTE — Miscellaneous (Signed)
Summary: Hemoccult screening  Clinical Lists Changes  Observations: Added new observation of HEMOCCULT: negative (03/29/2010 8:53)      Preventive Care Screening  Hemoccult:    Date:  03/29/2010    Results:  negative

## 2010-10-05 NOTE — Progress Notes (Signed)
Summary: needs refills on meds  Phone Note Refill Request Message from:  Patient  Refills Requested: Medication #1:  FLONASE 50 MCG/ACT SUSP 1 sp in each nostril daily  Medication #2:  DIOVAN HCT 160-12.5 MG TABS 1 by mouth qd  Medication #3:  VESICARE 10 MG TABS 1 by mouth qd  Medication #4:  HYDROCHLOROTHIAZIDE 25 MG TABS 1 by mouth once daily. Pt needs written scripts for mail order, please call when ready.  Pt states she needs additional quanity on the flonase- she said she runs out too soon.  She has an appt in july.  Initial call taken by: Lowella Petties,  February 05, 2009 9:09 AM  Follow-up for Phone Call        needs to schedule PE whenever she can  printed and in my out box for pick up  will change instructions on flonase - see if they increase quanitiy   Follow-up by: Judith Part MD,  February 05, 2009 12:15 PM  Additional Follow-up for Phone Call Additional follow up Details #1::        I called pt, did not get an answer. .............................................Marland KitchenLiane Comber February 05, 2009 2:23 PM Rx left at front desk for patient to pick up. .............................................................Marland KitchenLiane Comber February 05, 2009 2:23 PM     Additional Follow-up for Phone Call Additional follow up Details #2::    Advised patient.  ......................................................Marland KitchenNatasha Chavers February 06, 2009 3:52 PM   New/Updated Medications: FLONASE 50 MCG/ACT SUSP (FLUTICASONE PROPIONATE) 2 sprays iin each nostril daily DIOVAN HCT 160-12.5 MG TABS (VALSARTAN-HYDROCHLOROTHIAZIDE) 1 by mouth once daily VESICARE 10 MG TABS (SOLIFENACIN SUCCINATE) 1 by mouth once daily HYDROCHLOROTHIAZIDE 25 MG TABS (HYDROCHLOROTHIAZIDE) 1 by mouth once daily   Prescriptions: HYDROCHLOROTHIAZIDE 25 MG TABS (HYDROCHLOROTHIAZIDE) 1 by mouth once daily  #90 x 3   Entered and Authorized by:   Judith Part MD   Signed by:   Judith Part MD on 02/05/2009   Method  used:   Print then Give to Patient   RxID:   [id number] VESICARE 10 MG TABS (SOLIFENACIN SUCCINATE) 1 by mouth once daily  #90 x 3   Entered and Authorized by:   Judith Part MD   Signed by:   Judith Part MD on 02/05/2009   Method used:   Print then Give to Patient   RxID:   [id number] DIOVAN HCT 160-12.5 MG TABS (VALSARTAN-HYDROCHLOROTHIAZIDE) 1 by mouth once daily  #90 x 3   Entered and Authorized by:   Judith Part MD   Signed by:   Judith Part MD on 02/05/2009   Method used:   Print then Give to Patient   RxID:   [id number] FLONASE 50 MCG/ACT SUSP (FLUTICASONE PROPIONATE) 2 sprays iin each nostril daily  #3 months x 3   Entered and Authorized by:   Judith Part MD   Signed by:   Judith Part MD on 02/05/2009   Method used:   Print then Give to Patient   RxID:   [id number]

## 2010-10-05 NOTE — Miscellaneous (Signed)
Summary: BONE DENSITY  Clinical Lists Changes  Orders: Added new Test order of T-Bone Densitometry (77080) - Signed Added new Test order of T-Lumbar Vertebral Assessment (77082) - Signed 

## 2010-10-05 NOTE — Assessment & Plan Note (Signed)
Summary: CPX / LFW   Vital Signs:  Patient profile:   65 year old female Height:      62 inches Weight:      198.75 pounds BMI:     36.48 Temp:     98 degrees F oral Pulse rate:   64 / minute Pulse rhythm:   regular BP sitting:   120 / 70  (left arm) Cuff size:   large  Vitals Entered By: Lewanda Rife LPN (March 22, 2010 9:32 AM) CC: CPX LMP part hyst 1989   History of Present Illness: here for check up of her chronic medical problems and to rev health mt list   nothing new -- feeling great   wt is down 3 lb has not worked on it hard enough  she tried nutrasystem - dislikes the food   not a lot of exercise  chronic foot pain  chases 5 grandchildren  could do water exercise- is not motivated   bp is 120/70- good control of HTN   lipids are stable and well controlled with trig 77, HDL 39 and LDL 70   hyperglycemia stable with AIC of 6.2 is staying away from sweet drinks (some diet coke)  fat free and sugar free items  does not eat much    dexa nl in 02- is open to another one  ca and vit d - is taking that   hyst partial past - nl pap 08 no symptoms or problems   mam 7/10 self exam - no lumps or changes   colon screen-- declines colonosc  will do stool card- promises it   Td 09 zostavax 09  never had  pneumovax - is interested in it   Allergies: 1)  ! * Aleve  Past History:  Past Medical History: Last updated: 01/30/2008 Hypertension obesity  hyperlipidemia hyperglycemia  all rhinitis  mixed incontinence plantar fasciiitis- with significant disability tendonitis L foot-- chronic foot pain  Past Surgical History: Last updated: 12/20/2006 Hysterectomy- fibroids, ovaries intact Dexa- normal (01/2001) Tendon rupture left foot (01/2006)  Family History: Last updated: 01/23/2007 mother MI, ?thyroid probs father with lung ca  Social History: Last updated: 01/30/2008 non smoker cannot exercise to chronic foot pain  no alcohol   Risk  Factors: Smoking Status: never (12/20/2006)  Review of Systems General:  Denies fatigue, loss of appetite, and malaise. Eyes:  Denies blurring and eye irritation. CV:  Denies chest pain or discomfort, lightheadness, and palpitations. Resp:  Denies cough, shortness of breath, and wheezing. GI:  Denies abdominal pain, bloody stools, change in bowel habits, indigestion, and nausea. GU:  Denies dysuria and urinary frequency. MS:  Denies joint redness, joint swelling, and cramps. Derm:  Denies lesion(s) and rash. Neuro:  Denies numbness and tingling. Psych:  mood is ok . Endo:  Denies cold intolerance, excessive thirst, excessive urination, and heat intolerance. Heme:  Denies abnormal bruising and bleeding.  Physical Exam  General:  overwt but well appearing Head:  normocephalic, atraumatic, and no abnormalities observed.   Eyes:  vision grossly intact, pupils equal, pupils round, and pupils reactive to light.   Ears:  R ear normal and L ear normal.   Nose:  nares are boggy but clear  Mouth:  pharynx pink and moist, no erythema, and no exudates.   Neck:  supple with full rom and no masses or thyromegally, no JVD or carotid bruit  Chest Wall:  No deformities, masses, or tenderness noted. Breasts:  No mass, nodules, thickening, tenderness,  bulging, retraction, inflamation, nipple discharge or skin changes noted.   Lungs:  Normal respiratory effort, chest expands symmetrically. Lungs are clear to auscultation, no crackles or wheezes. Heart:  Normal rate and regular rhythm. S1 and S2 normal without gallop, murmur, click, rub or other extra sounds. Abdomen:  Bowel sounds positive,abdomen soft and non-tender without masses, organomegaly or hernias noted. no renal bruits  Msk:  No deformity or scoliosis noted of thoracic or lumbar spine.  no acute joint changes  L shoulder still has limited rom Pulses:  R and L carotid,radial,femoral,dorsalis pedis and posterior tibial pulses are full and equal  bilaterally Extremities:  No clubbing, cyanosis, edema, or deformity noted with normal full range of motion of all joints.   Neurologic:  sensation intact to light touch, gait normal, and DTRs symmetrical and normal.   Skin:  Intact without suspicious lesions or rashes very tanned  Cervical Nodes:  No lymphadenopathy noted Axillary Nodes:  No palpable lymphadenopathy Inguinal Nodes:  No significant adenopathy Psych:  normal affect, talkative and pleasant    Impression & Recommendations:  Problem # 1:  SPECIAL SCREENING FOR OSTEOPOROSIS (ICD-V82.81) Assessment Comment Only sched screening dexa rev rec for ca and vit  d Orders: Radiology Referral (Radiology)  Problem # 2:  HYPERCHOLESTEROLEMIA, PURE (ICD-272.0) Assessment: Unchanged  rev labs and very good control with lipitor and diet  Her updated medication list for this problem includes:    Lipitor 10 Mg Tabs (Atorvastatin calcium) .Marland Kitchen... 1 by mouth once daily  Labs Reviewed: SGOT: 18 (03/15/2010)   SGPT: 18 (03/15/2010)   HDL:39.70 (03/15/2010), 40.60 (08/21/2009)  LDL:70 (03/15/2010), 68 (08/21/2009)  Chol:125 (03/15/2010), 119 (08/21/2009)  Trig:77.0 (03/15/2010), 53.0 (08/21/2009)  Problem # 3:  HYPERGLYCEMIA (ICD-790.29) Assessment: Unchanged  rev AIc of 6.2 explained that she is borderline Dm and that exercise/ diet are imperative for wt loss  will be difficult for her to make this a priority  lab and f/u 6 mo   Labs Reviewed: Creat: 1.1 (03/15/2010)     Problem # 4:  Preventive Health Care (ICD-V70.0) Assessment: Comment Only pneumovax today stool card today  Problem # 5:  HYPERTENSION (ICD-401.9) good control wtih current meds  Her updated medication list for this problem includes:    Diovan Hct 160-12.5 Mg Tabs (Valsartan-hydrochlorothiazide) .Marland Kitchen... 1 by mouth once daily    Hydrochlorothiazide 25 Mg Tabs (Hydrochlorothiazide) .Marland Kitchen... 1 by mouth once daily  Complete Medication List: 1)  Flonase 50 Mcg/act  Susp (Fluticasone propionate) .... 2 sprays iin each nostril daily 2)  Diovan Hct 160-12.5 Mg Tabs (Valsartan-hydrochlorothiazide) .Marland Kitchen.. 1 by mouth once daily 3)  Vesicare 10 Mg Tabs (Solifenacin succinate) .Marland Kitchen.. 1 by mouth once daily 4)  Hydrochlorothiazide 25 Mg Tabs (Hydrochlorothiazide) .Marland Kitchen.. 1 by mouth once daily 5)  Lipitor 10 Mg Tabs (Atorvastatin calcium) .Marland Kitchen.. 1 by mouth once daily 6)  Mobic 7.5 Mg Tabs (Meloxicam) .Marland Kitchen.. 1 by mouth once daily with food 7)  Multivitamins Tabs (Multiple vitamin) .... Take 1 tablet by mouth once a day 8)  Calcium With Vitamin D ?mg  .... Take 1 tablet by mouth once a day  Other Orders: Pneumococcal Vaccine (16109) Admin 1st Vaccine (60454) Admin 1st Vaccine [facility]) [phone])  Patient Instructions: 1)  the current recommendation for calcium intake is 1200-1500 mg daily with 1000 IU of vitamin D  2)  please do stool card for colon cancer screening 3)  we will schedule bone density and mammogram at check out  4)  please work hard  on low sugar diet and weight loss 5)  perhaps water exercise - or something low impact would be tolerable  6)  pneumonia shot  7)  schedule fasting lab in 6 months lipid/ast/alt/AIC /renal for 272 and hyperglycemia and then follow up   Current Allergies (reviewed today): ! * ALEVE     Pneumovax Vaccine    Vaccine Type: Pneumovax    Site: left deltoid    Mfr: Merck    Dose: 0.5 ml    Route: IM    Given by: Lewanda Rife LPN    Exp. Date: 09/04/2011    Lot #: [id number]    VIS given: 04/02/96 version given March 22, 2010.

## 2010-10-07 NOTE — Progress Notes (Signed)
Summary: pt having abd pain- going to ER  Phone Note Call from Patient Call back at [phone]   Caller: Spouse Call For: Andrea Part MD Summary of Call: Pt is out of town in [place].  She was having pain around ribs, went to urgent care.  They have advised her to go to ER- she is on her way there now.  Husband is asking if she has to have any type of surgery should she have it done there, or wait till she comes back to town.  I advised him to see what the problem is and go by what the doctors there advise. Initial call taken by: Lowella Petties CMA, AAMA,  September 14, 2010 10:29 AM  Follow-up for Phone Call        I agree- depends how urgent it is and what doctors there advise  please keep me updated  Follow-up by: Andrea Part MD,  September 14, 2010 10:42 AM

## 2010-10-07 NOTE — Progress Notes (Signed)
  Phone Note From Other Clinic   Summary of Call: call from Dr Carlisle Beers [phone] cell)  he is surgeon that did her ccy calling to give her report she had ccy for gallstone pancreatitis - it required open surgery due to severity  there for post op visit and doing well -- with some typical post op fatigue she had some gout in hosp - he started her on medrol dosepak and it got better quickly he is now starting her on allopurinol 300 - and she will need f/u   Follow-up for Phone Call        I inst him to have her call when she gets back in town to make f/u with me for gout and thanked him for his care  Follow-up by: Judith Part MD,  September 30, 2010 12:44 PM

## 2010-10-07 NOTE — Progress Notes (Signed)
Summary: pt needs surgery  Phone Note Call from Patient Call back at [phone]   Caller: Spouse Call For: Judith Part MD Summary of Call: Pt has inflammation in gallbladder, pancreas and liver.  The doctor at the hospital in [facility] wants pt to have surgery to remove her gall bladder.  Her husband is asking if pt should have surgery there, advised him that he needs to go by what the surgeon there said.  Surgeon wants her to stay there for surgery.  Advised husband that is what she needs to do. Initial call taken by: Lowella Petties CMA, AAMA,  September 14, 2010 4:27 PM  Follow-up for Phone Call        thanks - I agree with that  Follow-up by: Judith Part MD,  September 14, 2010 4:51 PM

## 2010-10-11 ENCOUNTER — Other Ambulatory Visit: Payer: Self-pay

## 2010-10-18 ENCOUNTER — Ambulatory Visit: Payer: Self-pay | Admitting: Family Medicine

## 2010-10-18 DIAGNOSIS — Z0289 Encounter for other administrative examinations: Secondary | ICD-10-CM

## 2010-10-21 NOTE — Letter (Signed)
Summary: [facility]  [facility]   Imported By: Beau Fanny 10/15/2010 13:25:19  _____________________________________________________________________  External Attachment:    Type:   Image     Comment:   External Document

## 2010-10-21 NOTE — Letter (Signed)
Summary: Dr.[facility] Telford [facility],Note  Dr.Steven Matzinger,[facility],Note   Imported By: Beau Fanny 10/15/2010 13:26:54  _____________________________________________________________________  External Attachment:    Type:   Image     Comment:   External Document

## 2010-10-21 NOTE — Op Note (Signed)
Summary: Attempted laparoscopic cholecystectomy w/ conversion to open cho  Attempted laparoscopic cholecystectomy w/ conversion to open cholecystectomy,[facility]   Imported By: Beau Fanny 10/15/2010 13:28:06  _____________________________________________________________________  External Attachment:    Type:   Image     Comment:   External Document

## 2010-10-21 NOTE — Letter (Signed)
Summary: [facility]  [facility]   Imported By: Kassie Mends 10/12/2010 09:01:09  _____________________________________________________________________  External Attachment:    Type:   Image     Comment:   External Document

## 2010-11-17 ENCOUNTER — Ambulatory Visit (INDEPENDENT_AMBULATORY_CARE_PROVIDER_SITE_OTHER): Payer: Medicare Other | Admitting: Family Medicine

## 2010-11-17 ENCOUNTER — Other Ambulatory Visit: Payer: Self-pay | Admitting: Family Medicine

## 2010-11-17 ENCOUNTER — Encounter: Payer: Self-pay | Admitting: Family Medicine

## 2010-11-17 DIAGNOSIS — M109 Gout, unspecified: Secondary | ICD-10-CM

## 2010-11-17 DIAGNOSIS — Z8719 Personal history of other diseases of the digestive system: Secondary | ICD-10-CM

## 2010-11-17 DIAGNOSIS — I10 Essential (primary) hypertension: Secondary | ICD-10-CM

## 2010-11-17 HISTORY — DX: Personal history of other diseases of the digestive system: Z87.19

## 2010-11-17 LAB — RENAL FUNCTION PANEL
CO2: 34 mEq/L — ABNORMAL HIGH (ref 19–32)
Chloride: 97 mEq/L (ref 96–112)
Creatinine, Ser: 0.8 mg/dL (ref 0.4–1.2)
GFR: 77.33 mL/min (ref >60.00)
Phosphorus: 3.9 mg/dL (ref 2.3–4.6)
Sodium: 139 mEq/L (ref 135–145)

## 2010-11-17 LAB — HEPATIC FUNCTION PANEL
AST: 22 U/L (ref 0–37)
Alkaline Phosphatase: 71 U/L (ref 39–117)
Total Bilirubin: 0.8 mg/dL (ref 0.3–1.2)

## 2010-11-17 LAB — URIC ACID: Uric Acid, Serum: 5.4 mg/dL (ref 2.4–7.0)

## 2010-11-23 ENCOUNTER — Telehealth: Payer: Self-pay | Admitting: *Deleted

## 2010-11-23 NOTE — Telephone Encounter (Signed)
Form from [facility] is on your shelf.  They are asking to increase the amount of colcrys that was sent in a couple of days ago.

## 2010-11-23 NOTE — Assessment & Plan Note (Signed)
Summary: HOSPITAL FOLLOW UP- had cholecystectomy in [place]   Vital Signs:  Patient profile:   65 year old female Weight:      186.75 pounds BMI:     34.28 Temp:     98.2 degrees F oral Pulse rate:   74 / minute Pulse rhythm:   regular BP sitting:   110 / 82  (left arm) Cuff size:   large  Vitals Entered By: Selena Batten Dance CMA Duncan Dull) (November 17, 2010 12:39 PM) CC: Hospital follow up Comments Cholecystectomy in [place]   History of Present Illness: here for f/u of ccy in [place] this was emergent and required open proceedure got really sick - gallstone pancreatitis  had calls with updates from her Drs there she had great support from family -- stayed with her in the hospital  is well now  no diarrhea after proceedure    also had gout and started on allopurinol 300 mg  gave steroids and that did help  had severe toe pain and swelling in the hospital    one flare since then -  mild in her L foot - toe --much milder  now just a little red  feeling better now did not know if she could still use colcrys      Allergies: 1)  ! * Aleve  Past History:  Past Medical History: Last updated: 01/30/2008 Hypertension obesity  hyperlipidemia hyperglycemia  all rhinitis  mixed incontinence plantar fasciiitis- with significant disability tendonitis L foot-- chronic foot pain  Past Surgical History: Last updated: 12/20/2006 Hysterectomy- fibroids, ovaries intact Dexa- normal (01/2001) Tendon rupture left foot (01/2006)  Family History: Last updated: 01/23/2007 mother MI, ?thyroid probs father with lung ca  Social History: Last updated: 01/30/2008 non smoker cannot exercise to chronic foot pain  no alcohol   Risk Factors: Smoking Status: never (12/20/2006)  Review of Systems General:  Denies fatigue, loss of appetite, and malaise. Eyes:  Denies blurring and eye pain. CV:  Denies chest pain or discomfort, palpitations, and shortness of breath with exertion. Resp:   Denies cough, shortness of breath, sputum productive, and wheezing. GI:  Denies abdominal pain, change in bowel habits, indigestion, and nausea. GU:  Denies dysuria and urinary frequency. MS:  Complains of joint pain, joint redness, and joint swelling; denies muscle weakness and stiffness. Derm:  Denies itching, lesion(s), poor wound healing, and rash. Neuro:  Denies numbness and tingling. Psych:  Denies anxiety and depression. Endo:  Denies cold intolerance, excessive thirst, excessive urination, and heat intolerance. Heme:  Denies abnormal bruising and bleeding.  Physical Exam  General:  overwt but well appearing Head:  normocephalic, atraumatic, and no abnormalities observed.   Eyes:  vision grossly intact, pupils equal, pupils round, and pupils reactive to light.  no conjunctival pallor, injection or icterus  Mouth:  pharynx pink and moist.   Neck:  supple with full rom and no masses or thyromegally, no JVD or carotid bruit  Chest Wall:  No deformities, masses, or tenderness noted. Lungs:  Normal respiratory effort, chest expands symmetrically. Lungs are clear to auscultation, no crackles or wheezes. (no pain on deep inspiration) Heart:  Normal rate and regular rhythm. S1 and S2 normal without gallop, murmur, click, rub or other extra sounds. Abdomen:  Bowel sounds positive,abdomen soft and non-tender without masses, organomegaly or hernias noted. well healed ccy scar  Msk:  L 1st MCP joint slt red and tender no other joint changes  Pulses:  R and L carotid,radial,femoral,dorsalis pedis and posterior  tibial pulses are full and equal bilaterally Extremities:  No clubbing, cyanosis, edema, or deformity noted with normal full range of motion of all joints.   Skin:  Intact without suspicious lesions or rashes Cervical Nodes:  No lymphadenopathy noted Inguinal Nodes:  No significant adenopathy Psych:  normal affect, talkative and pleasant    Impression & Recommendations:  Problem #  1:  GOUT, UNSPECIFIED (ICD-274.9) Assessment New this is improved with allopurinol check lft and uric acid today  adv colcrys is ok for acute flares sparingly   Her updated medication list for this problem includes:    Mobic 7.5 Mg Tabs (Meloxicam) .Marland Kitchen... 1 by mouth once daily with food as needed.    Colcrys 0.6 Mg Tabs (Colchicine) .Marland Kitchen... Take 2 now with food and then 1 by mouth with food two times a day as needed gout pain    Allopurinol 300 Mg Tabs (Allopurinol) .Marland Kitchen... 1 by mouth once daily  Orders: Venipuncture (01027) TLB-Renal Function Panel (80069-RENAL) TLB-Hepatic/Liver Function Pnl (80076-HEPATIC) TLB-Uric Acid, Blood (84550-URIC)  Problem # 2:  CHOLELITHIASIS, HX OF (ICD-V12.79) Assessment: New with gallstone pancreatitis and hepatitis resolved after surgery re check labs today symptom free rev hosp info with pt   Complete Medication List: 1)  Flonase 50 Mcg/act Susp (Fluticasone propionate) .... 2 sprays iin each nostril daily 2)  Diovan Hct 160-12.5 Mg Tabs (Valsartan-hydrochlorothiazide) .Marland Kitchen.. 1 by mouth once daily 3)  Vesicare 10 Mg Tabs (Solifenacin succinate) .Marland Kitchen.. 1 by mouth once daily 4)  Hydrochlorothiazide 25 Mg Tabs (Hydrochlorothiazide) .Marland Kitchen.. 1 by mouth once daily 5)  Lipitor 10 Mg Tabs (Atorvastatin calcium) .Marland Kitchen.. 1 by mouth once daily 6)  Mobic 7.5 Mg Tabs (Meloxicam) .Marland Kitchen.. 1 by mouth once daily with food as needed. 7)  Multivitamins Tabs (Multiple vitamin) .... Take 1 tablet by mouth once a day 8)  Calcium With Vitamin D ?mg  .... Take 1 tablet by mouth once a day 9)  Colcrys 0.6 Mg Tabs (Colchicine) .... Take 2 now with food and then 1 by mouth with food two times a day as needed gout pain 10)  Aspirin 81 Mg Tabs (Aspirin) .... Take 1 tablet by mouth once a day 11)  Vitamin E ?mg  .... Take 1 tablet by mouth once a day 12)  Vitamin C ?mg  .... Take 1 tablet by mouth once a day 13)  Nexium 40 Mg Cpdr (Esomeprazole magnesium) .Marland Kitchen.. 1 by mouth once daily 14)   Allopurinol 300 Mg Tabs (Allopurinol) .Marland Kitchen.. 1 by mouth once daily  Patient Instructions: 1)  labs today for uric acid (gout ) and also liver and kidney function  2)  I will advise if you need to change medicines 3)  use colcrys for acute gout when you need it  4)  I'm glad you are doing better  5)  schedule 30 minute check up with labs prior in late july   Orders Added: 1)  Venipuncture [36415] 2)  TLB-Renal Function Panel [80069-RENAL] 3)  TLB-Hepatic/Liver Function Pnl [80076-HEPATIC] 4)  TLB-Uric Acid, Blood [84550-URIC] 5)  Est. Patient Level IV [25366]    Current Allergies (reviewed today): ! * ALEVE

## 2010-11-24 ENCOUNTER — Other Ambulatory Visit: Payer: Self-pay

## 2010-11-24 MED ORDER — COLCHICINE 0.6 MG PO TABS: ORAL_TABLET | ORAL | Status: DC

## 2010-11-24 NOTE — Telephone Encounter (Signed)
I'm ok with giving her 90 tabs This is a prn med if I'm not wrong - so since she does not use it daily is probably ok  Will put form in Rena's in box Please note change in px on med list if you can (I'm still learing how to do this)

## 2010-11-24 NOTE — Telephone Encounter (Signed)
Completed form faxed to [phone] as instructed. Will route back to me to get assistance with changing quantity on med list.

## 2011-03-16 ENCOUNTER — Other Ambulatory Visit: Payer: Self-pay | Admitting: *Deleted

## 2011-03-16 MED ORDER — ATORVASTATIN CALCIUM 10 MG PO TABS: 10.0000 mg | ORAL_TABLET | Freq: Every day | ORAL | Status: DC

## 2011-03-16 MED ORDER — FLUTICASONE PROPIONATE 50 MCG/ACT NA SUSP: 2.0000 | Freq: Every day | NASAL | Status: DC

## 2011-03-16 MED ORDER — VALSARTAN-HYDROCHLOROTHIAZIDE 160-12.5 MG PO TABS: 1.0000 | ORAL_TABLET | Freq: Every day | ORAL | Status: DC

## 2011-03-16 MED ORDER — MELOXICAM 7.5 MG PO TABS: 7.5000 mg | ORAL_TABLET | Freq: Every day | ORAL | Status: DC

## 2011-03-16 MED ORDER — SOLIFENACIN SUCCINATE 10 MG PO TABS: 5.0000 mg | ORAL_TABLET | Freq: Every day | ORAL | Status: DC

## 2011-03-16 MED ORDER — HYDROCHLOROTHIAZIDE 25 MG PO TABS: 25.0000 mg | ORAL_TABLET | Freq: Every day | ORAL | Status: DC

## 2011-03-16 MED ORDER — ALLOPURINOL 300 MG PO TABS: 300.0000 mg | ORAL_TABLET | Freq: Every day | ORAL | Status: DC

## 2011-03-16 NOTE — Telephone Encounter (Signed)
I sent electronicallhy- if you think this won't work - let me know

## 2011-03-16 NOTE — Telephone Encounter (Signed)
Patient notified as instructed by telephone. 

## 2011-03-16 NOTE — Telephone Encounter (Signed)
Patient has a cpx scheduled for 03-30-11, but will run out of meds before then. Patient is requesting year supply to be sent to [facility]. I advised her that we could do that, but she would still need to keep her cpx appt.

## 2011-03-17 ENCOUNTER — Other Ambulatory Visit: Payer: Self-pay | Admitting: *Deleted

## 2011-03-17 MED ORDER — SOLIFENACIN SUCCINATE 10 MG PO TABS: 10.0000 mg | ORAL_TABLET | Freq: Every day | ORAL | Status: DC

## 2011-03-17 NOTE — Telephone Encounter (Signed)
When medications were abstracted the vesicare directions were entered wrong. Changes in chart and also with pharmacy.

## 2011-03-19 ENCOUNTER — Encounter: Payer: Self-pay | Admitting: Family Medicine

## 2011-03-22 ENCOUNTER — Telehealth: Payer: Self-pay | Admitting: Family Medicine

## 2011-03-22 DIAGNOSIS — I10 Essential (primary) hypertension: Secondary | ICD-10-CM

## 2011-03-22 DIAGNOSIS — M109 Gout, unspecified: Secondary | ICD-10-CM

## 2011-03-22 DIAGNOSIS — R7309 Other abnormal glucose: Secondary | ICD-10-CM

## 2011-03-22 DIAGNOSIS — E7800 Pure hypercholesterolemia, unspecified: Secondary | ICD-10-CM

## 2011-03-22 NOTE — Telephone Encounter (Signed)
Message copied by Judy Pimple on Tue Mar 22, 2011 10:34 PM ------      Message from: Andrea Peters      Created: Mon Mar 21, 2011 12:06 PM      Regarding: F/U labs Wed       Please order  future f/u labs for pt's upcomming lab appt.      Thanks      Rodney Booze

## 2011-03-23 ENCOUNTER — Other Ambulatory Visit (INDEPENDENT_AMBULATORY_CARE_PROVIDER_SITE_OTHER): Payer: Medicare Other | Admitting: Family Medicine

## 2011-03-23 DIAGNOSIS — I10 Essential (primary) hypertension: Secondary | ICD-10-CM

## 2011-03-23 DIAGNOSIS — M109 Gout, unspecified: Secondary | ICD-10-CM

## 2011-03-23 DIAGNOSIS — R7309 Other abnormal glucose: Secondary | ICD-10-CM

## 2011-03-23 DIAGNOSIS — E7800 Pure hypercholesterolemia, unspecified: Secondary | ICD-10-CM

## 2011-03-23 LAB — COMPREHENSIVE METABOLIC PANEL
ALT: 16 U/L (ref 0–35)
AST: 19 U/L (ref 0–37)
Albumin: 4.3 g/dL (ref 3.5–5.2)
BUN: 20 mg/dL (ref 6–23)
CO2: 33 mEq/L — ABNORMAL HIGH (ref 19–32)
Calcium: 9.6 mg/dL (ref 8.4–10.5)
Chloride: 105 mEq/L (ref 96–112)
Creatinine, Ser: 0.9 mg/dL (ref 0.4–1.2)
GFR: 63.21 mL/min (ref >60.00)
Potassium: 4.2 mEq/L (ref 3.5–5.1)

## 2011-03-23 LAB — LIPID PANEL
Cholesterol: 116 mg/dL (ref 0–200)
HDL: 48.5 mg/dL (ref >39.00)
Total CHOL/HDL Ratio: 2
Triglycerides: 62 mg/dL (ref 0.0–149.0)

## 2011-03-23 LAB — CBC WITH DIFFERENTIAL/PLATELET
Basophils Absolute: 0 10*3/uL (ref 0.0–0.1)
Eosinophils Absolute: 0.3 10*3/uL (ref 0.0–0.7)
Lymphocytes Relative: 23.1 % (ref 12.0–46.0)
MCHC: 33.2 g/dL (ref 30.0–36.0)
Monocytes Relative: 7.5 % (ref 3.0–12.0)
Neutrophils Relative %: 64 % (ref 43.0–77.0)
Platelets: 246 10*3/uL (ref 150.0–400.0)
RDW: 14.4 % (ref 11.5–14.6)

## 2011-03-23 LAB — TSH: TSH: 3.1 u[IU]/mL (ref 0.35–5.50)

## 2011-03-30 ENCOUNTER — Encounter: Payer: Self-pay | Admitting: Family Medicine

## 2011-03-30 ENCOUNTER — Ambulatory Visit (INDEPENDENT_AMBULATORY_CARE_PROVIDER_SITE_OTHER): Payer: Medicare Other | Admitting: Family Medicine

## 2011-03-30 DIAGNOSIS — Z1231 Encounter for screening mammogram for malignant neoplasm of breast: Secondary | ICD-10-CM

## 2011-03-30 DIAGNOSIS — I10 Essential (primary) hypertension: Secondary | ICD-10-CM

## 2011-03-30 DIAGNOSIS — R7309 Other abnormal glucose: Secondary | ICD-10-CM

## 2011-03-30 DIAGNOSIS — M109 Gout, unspecified: Secondary | ICD-10-CM

## 2011-03-30 DIAGNOSIS — E7800 Pure hypercholesterolemia, unspecified: Secondary | ICD-10-CM

## 2011-03-30 MED ORDER — FLUTICASONE PROPIONATE 50 MCG/ACT NA SUSP: 2.0000 | Freq: Every day | NASAL | Status: DC

## 2011-03-30 NOTE — Patient Instructions (Signed)
We will do mammogram referral at check out  Work on weight loss  Schedule lab and follow up in 3  Months  No sweets/ sugar drinks or juices Cut your starches/ carbohydrates to 2-3 oz servings -- always choose whole wheat or grain if possible  Work hard on weight loss Aim to work up to 30 minutes of exercise 5 days per week  Here is new px for flonase

## 2011-03-30 NOTE — Assessment & Plan Note (Signed)
This is worse- borderline DM Disc risks of DM  Disc need for wt loss and low glycemic diet(counseled in detail) Will check a1c and f/u in 3 months

## 2011-03-30 NOTE — Assessment & Plan Note (Signed)
Breast exam done  Mam ordered Reminded to do own breast exam

## 2011-03-30 NOTE — Assessment & Plan Note (Signed)
Much better on current allopurinol dose Rev labs

## 2011-03-30 NOTE — Progress Notes (Signed)
Subjective:    Patient ID: Andrea Peters, female    DOB: 1946-04-19, 65 y.o.   MRN: [id number]  HPI Here for check up of chronic health problems and also to rev health mt list  Is feeling pretty good   Getting her stamina back from foot surgery and ordeal  Gets tired after 2 hours  Foot is feeling much better -- allopurinol is working well   Wt is up 10 lb She lost 15 lb during jan -- and then gained it back  Cannot do much exercise  Not motivated to loose   Mam7/11 neg  Self exam  Had hyst partial for fibroids No gyn symptoms or problems  Td 5/09 ptx 7/11 zost vax 09  HTN in good control 128/72    Lipids are in good control with diet and lipitor Follows low sat fat diet Lab Results  Component Value Date   CHOL 116 03/23/2011   CHOL 125 03/15/2010   CHOL 119 08/21/2009   Lab Results  Component Value Date   HDL 48.50 03/23/2011   HDL 29.52 03/15/2010   HDL 84.13 08/21/2009   Lab Results  Component Value Date   LDLCALC 55 03/23/2011   LDLCALC 70 03/15/2010   LDLCALC 68 08/21/2009   Lab Results  Component Value Date   TRIG 62.0 03/23/2011   TRIG 77.0 03/15/2010   TRIG 53.0 08/21/2009   Lab Results  Component Value Date   CHOLHDL 2 03/23/2011   CHOLHDL 3 03/15/2010   CHOLHDL 3 08/21/2009   Lab Results  Component Value Date   LDLDIRECT 141.9 01/23/2007     Hyperglycemia a1c is 6.4 Gluc 110 Is borderline diabetic   Diet is not a big sweet eater Really likes bread - esp eating out   Patient Active Problem List  Diagnoses  . HYPERCHOLESTEROLEMIA, PURE  . HYPERTENSION  . ALLERGIC  RHINITIS  . FASCIITIS, PLANTAR  . VERTIGO  . URINARY INCONTINENCE, STRESS  . HYPERGLYCEMIA  . GOUT, UNSPECIFIED  . CHOLELITHIASIS, HX OF  . Other screening mammogram   Past Medical History  Diagnosis Date  . Hypertension   . Hyperlipidemia   . Obesity   . Hyperglycemia   . Allergic rhinitis   . Mixed incontinence   . Plantar fasciitis     with significant  disability  . Tendonitis of foot     L, chronic foot pain   Past Surgical History  Procedure Date  . Abdominal hysterectomy     fibroids, ovaries intact  . Tendon repair 5/07    Tendon rupture L foot   History  Substance Use Topics  . Smoking status: Never Smoker   . Smokeless tobacco: Not on file  . Alcohol Use: No   Family History  Problem Relation Age of Onset  . Heart attack Mother   . Other Mother     ?thyroid problems  . Lung cancer Father    Allergies  Allergen Reactions  . Naproxen Sodium    Current Outpatient Prescriptions on File Prior to Visit  Medication Sig Dispense Refill  . allopurinol (ZYLOPRIM) 300 MG tablet Take 1 tablet (300 mg total) by mouth daily.  90 tablet  3  . aspirin 81 MG tablet Take 81 mg by mouth daily.        Marland Kitchen atorvastatin (LIPITOR) 10 MG tablet Take 1 tablet (10 mg total) by mouth daily.  90 tablet  3  . CALCIUM-VITAMIN D PO Take 1 tablet by mouth daily.        Marland Kitchen  hydrochlorothiazide 25 MG tablet Take 1 tablet (25 mg total) by mouth daily.  90 tablet  3  . meloxicam (MOBIC) 7.5 MG tablet Take 1 tablet (7.5 mg total) by mouth daily.  90 tablet  3  . Multiple Vitamin (MULTIVITAMIN) tablet Take 1 tablet by mouth daily.        . solifenacin (VESICARE) 10 MG tablet Take 1 tablet (10 mg total) by mouth daily.  90 tablet  3  . valsartan-hydrochlorothiazide (DIOVAN HCT) 160-12.5 MG per tablet Take 1 tablet by mouth daily.  90 tablet  3  . VITAMIN C, CALCIUM ASCORBATE, PO Take 1 tablet by mouth daily.        Marland Kitchen VITAMIN E PO Take 1 tablet by mouth daily.        . colchicine 0.6 MG tablet Take 2  tablets now with food and then 1 tablet twice a day with food  90 tablet  2  . esomeprazole (NEXIUM) 40 MG capsule Take 40 mg by mouth daily before breakfast.          Review of Systems Review of Systems  Constitutional: Negative for fever, appetite change,  and unexpected weight change. pos for fatigue and lack of motivation Eyes: Negative for pain and  visual disturbance.  Respiratory: Negative for cough and shortness of breath.   Cardiovascular: Negative.  for cp and sob  Gastrointestinal: Negative for nausea, diarrhea and constipation.  Genitourinary: Negative for urgency and frequency.  Skin: Negative for pallor. or rash , does still use tanner despite understanding risks and being counseled against it  Neurological: Negative for weakness, light-headedness, numbness and headaches.  Hematological: Negative for adenopathy. Does not bruise/bleed easily.  Psychiatric/Behavioral: Negative for dysphoric mood. The patient is not nervous/anxious.         Objective:   Physical Exam  Constitutional: She appears well-developed and well-nourished. No distress.       overwt and well appearing   HENT:  Head: Normocephalic and atraumatic.  Right Ear: External ear normal.  Left Ear: External ear normal.  Nose: Nose normal.  Mouth/Throat: Oropharynx is clear and moist.  Eyes: Conjunctivae and EOM are normal. Pupils are equal, round, and reactive to light.  Neck: Normal range of motion. Neck supple. No JVD present. No thyromegaly present.  Cardiovascular: Normal rate, regular rhythm, normal heart sounds and intact distal pulses.   Pulmonary/Chest: Effort normal and breath sounds normal. No respiratory distress. She has no wheezes.  Abdominal: Soft. Bowel sounds are normal. She exhibits no distension and no mass. There is no tenderness.  Genitourinary: No breast swelling, tenderness, discharge or bleeding.  Musculoskeletal: Normal range of motion. She exhibits no edema and no tenderness.  Lymphadenopathy:    She has no cervical adenopathy.  Neurological: She is alert. She has normal reflexes. No cranial nerve deficit. Coordination normal.  Skin: No rash noted. No erythema. No pallor.       Extremely tanned - with much solar aging   Psychiatric: She has a normal mood and affect.          Assessment & Plan:

## 2011-03-30 NOTE — Assessment & Plan Note (Signed)
bp in good control  No changes in med - reviewed  Disc working up to 5 d of exercise per week slowly

## 2011-03-30 NOTE — Assessment & Plan Note (Signed)
This is very well controlled with statin and diet  Urged to continue this

## 2011-05-11 ENCOUNTER — Ambulatory Visit: Payer: Self-pay | Admitting: Family Medicine

## 2011-06-13 ENCOUNTER — Encounter: Payer: Self-pay | Admitting: *Deleted

## 2011-07-07 ENCOUNTER — Other Ambulatory Visit (INDEPENDENT_AMBULATORY_CARE_PROVIDER_SITE_OTHER): Payer: Medicare Other

## 2011-07-07 DIAGNOSIS — R7309 Other abnormal glucose: Secondary | ICD-10-CM

## 2011-07-11 ENCOUNTER — Ambulatory Visit (INDEPENDENT_AMBULATORY_CARE_PROVIDER_SITE_OTHER): Payer: Medicare Other | Admitting: Family Medicine

## 2011-07-11 ENCOUNTER — Encounter: Payer: Self-pay | Admitting: Family Medicine

## 2011-07-11 VITALS — BP 138/74 | HR 80 | Temp 99.0°F | Ht 62.0 in | Wt 196.0 lb

## 2011-07-11 DIAGNOSIS — I10 Essential (primary) hypertension: Secondary | ICD-10-CM

## 2011-07-11 DIAGNOSIS — J029 Acute pharyngitis, unspecified: Secondary | ICD-10-CM

## 2011-07-11 DIAGNOSIS — J020 Streptococcal pharyngitis: Secondary | ICD-10-CM | POA: Insufficient documentation

## 2011-07-11 DIAGNOSIS — R7309 Other abnormal glucose: Secondary | ICD-10-CM

## 2011-07-11 LAB — POCT RAPID STREP A (OFFICE): Rapid Strep A Screen: POSITIVE — AB

## 2011-07-11 MED ORDER — AMOXICILLIN 500 MG PO CAPS: 500.0000 mg | ORAL_CAPSULE | Freq: Three times a day (TID) | ORAL | Status: AC

## 2011-07-11 NOTE — Progress Notes (Signed)
Subjective:    Patient ID: Andrea Peters, female    DOB: Jul 10, 1946, 65 y.o.   MRN: [id number]  HPI Here for f/u of HTN and hyperglycemia and also for new ST  Was around kids with strep-- 3 of her grandaughter had it last week Throat is sore -- started last night -- is sore to swallow- not too swollen Just starting  99 temp- feels warm No chills or sweats- but did not sleep well last night  Pos rapid strep today Not all to pcn  Wt is stable with bmi of 35 HTN is stable with 138/74 today(and she does not feel well with ST) No cp or edema or ha-- feels ok   Hyperglycemia is improved a1c down from 6.4 to 6.1 Diet-she cut out juice and other sugar drinks -that seemed to make the biggest difference  Exercise- no exercise for the most part- even though she has disc it - but doing a bit around the house Will be getting some equipt at home  No thirst or change in urination  Patient Active Problem List  Diagnoses  . HYPERCHOLESTEROLEMIA, PURE  . HYPERTENSION  . ALLERGIC  RHINITIS  . FASCIITIS, PLANTAR  . VERTIGO  . URINARY INCONTINENCE, STRESS  . HYPERGLYCEMIA  . GOUT, UNSPECIFIED  . CHOLELITHIASIS, HX OF  . Other screening mammogram  . Strep pharyngitis   Past Medical History  Diagnosis Date  . Hypertension   . Hyperlipidemia   . Obesity   . Hyperglycemia   . Allergic rhinitis   . Mixed incontinence   . Plantar fasciitis     with significant disability  . Tendonitis of foot     L, chronic foot pain   Past Surgical History  Procedure Date  . Abdominal hysterectomy     fibroids, ovaries intact  . Tendon repair 5/07    Tendon rupture L foot   History  Substance Use Topics  . Smoking status: Never Smoker   . Smokeless tobacco: Not on file  . Alcohol Use: No   Family History  Problem Relation Age of Onset  . Heart attack Mother   . Other Mother     ?thyroid problems  . Lung cancer Father    Allergies  Allergen Reactions  . Naproxen Sodium     Current Outpatient Prescriptions on File Prior to Visit  Medication Sig Dispense Refill  . allopurinol (ZYLOPRIM) 300 MG tablet Take 1 tablet (300 mg total) by mouth daily.  90 tablet  3  . aspirin 81 MG tablet Take 81 mg by mouth daily.        Marland Kitchen atorvastatin (LIPITOR) 10 MG tablet Take 1 tablet (10 mg total) by mouth daily.  90 tablet  3  . fluticasone (FLONASE) 50 MCG/ACT nasal spray Place 2 sprays into the nose daily.  48 g  3  . hydrochlorothiazide 25 MG tablet Take 1 tablet (25 mg total) by mouth daily.  90 tablet  3  . meloxicam (MOBIC) 7.5 MG tablet Take 1 tablet (7.5 mg total) by mouth daily.  90 tablet  3  . Multiple Vitamin (MULTIVITAMIN) tablet Take 2 tablets by mouth daily.       . solifenacin (VESICARE) 10 MG tablet Take 1 tablet (10 mg total) by mouth daily.  90 tablet  3  . valsartan-hydrochlorothiazide (DIOVAN HCT) 160-12.5 MG per tablet Take 1 tablet by mouth daily.  90 tablet  3  . CALCIUM-VITAMIN D PO Take 1 tablet by mouth daily.        Marland Kitchen  colchicine 0.6 MG tablet Take 2  tablets now with food and then 1 tablet twice a day with food  90 tablet  2  . esomeprazole (NEXIUM) 40 MG capsule Take 40 mg by mouth daily before breakfast.        . VITAMIN C, CALCIUM ASCORBATE, PO Take 1 tablet by mouth daily.        Marland Kitchen VITAMIN E PO Take 1 tablet by mouth daily.            Review of Systems Review of Systems  Constitutional: Negative for fever, appetite change, fatigue and unexpected weight change.  Eyes: Negative for pain and visual disturbance.  ENT pos for sore throat, neg for nasal congestion or sinus pain  Respiratory: Negative for cough and shortness of breath.   Cardiovascular: Negative for cp or palpitations    Gastrointestinal: Negative for nausea, diarrhea and constipation.  Genitourinary: Negative for urgency and frequency.  Skin: Negative for pallor or rash   Neurological: Negative for weakness, light-headedness, numbness and headaches.  Hematological: Negative for  adenopathy. Does not bruise/bleed easily.  Psychiatric/Behavioral: Negative for dysphoric mood. The patient is not nervous/anxious.          Objective:   Physical Exam  Constitutional: She appears well-developed and well-nourished. No distress.       overwt and well appearing   HENT:  Head: Normocephalic and atraumatic.  Right Ear: External ear normal.  Left Ear: External ear normal.  Nose: Nose normal.       Mild diffuse throat erythema without swelling or exudate   Eyes: Conjunctivae and EOM are normal. Pupils are equal, round, and reactive to light.  Neck: Normal range of motion. Neck supple. No JVD present. Carotid bruit is not present. No thyromegaly present.  Cardiovascular: Normal rate, regular rhythm, normal heart sounds and intact distal pulses.   Pulmonary/Chest: Effort normal and breath sounds normal. No respiratory distress. She has no wheezes.  Abdominal: Soft. Bowel sounds are normal.  Musculoskeletal: She exhibits no edema.  Lymphadenopathy:    She has no cervical adenopathy.  Neurological: She is alert. She has normal reflexes.  Skin: Skin is warm and dry. No rash noted. No erythema. No pallor.  Psychiatric: She has a normal mood and affect.          Assessment & Plan:

## 2011-07-11 NOTE — Assessment & Plan Note (Addendum)
Improved a1c of 6.1 from 6.4 Commended on good job Has eliminated sugar beverages Next step is cutting all starches and loosing weight Stressed importance of exercise  Wt is stable  Pt is very unmotivated for further change - and is clear about that - not willing to put in the effort currently

## 2011-07-11 NOTE — Assessment & Plan Note (Signed)
bp in fair control at this time  No changes needed  Disc lifstyle change with low sodium diet and exercise   

## 2011-07-11 NOTE — Assessment & Plan Note (Signed)
Caught early  Will treat with amoxicillin  Update if worse or any other symptoms

## 2011-07-11 NOTE — Patient Instructions (Signed)
Take amoxicillin for strep throat I sent px to pharmacy If worse or not improved- let me know  Drink lots of water Watch diet for sugar= start cutting portions of bread/ pasta/ rice / -- and also high fiber/ whole grain is always the better option  Goal is 5 days per week of exercise  Follow up in 6 months for 30 min annual exam with labs prior

## 2011-08-16 ENCOUNTER — Ambulatory Visit (INDEPENDENT_AMBULATORY_CARE_PROVIDER_SITE_OTHER): Payer: Medicare Other | Admitting: Family Medicine

## 2011-08-16 ENCOUNTER — Encounter: Payer: Self-pay | Admitting: Family Medicine

## 2011-08-16 DIAGNOSIS — J040 Acute laryngitis: Secondary | ICD-10-CM

## 2011-08-16 DIAGNOSIS — IMO0002 Reserved for concepts with insufficient information to code with codable children: Secondary | ICD-10-CM | POA: Insufficient documentation

## 2011-08-16 MED ORDER — CEPHALEXIN 500 MG PO CAPS: 500.0000 mg | ORAL_CAPSULE | Freq: Four times a day (QID) | ORAL | Status: AC

## 2011-08-16 NOTE — Progress Notes (Signed)
Prev note with strep reviewed.  Voice change since Friday.  Cough since Saturday.  Voice is some better but not back to normal.  No fevers. No sputum.  Cough worse at night.  A lot of post nasal gtt.  Taking mucinex chronically.  No ear pain.  No facial pain.    L thumb red and some discharge from the proximal nail bed.  Tender.  Going on for about 1 week.  Not soaking it much.    Meds, vitals, and allergies reviewed.   ROS: See HPI.  Otherwise, noncontributory.  GEN: nad, alert and oriented, nontoxic.  HEENT: mucous membranes moist, tm w/o erythema, nasal exam w/o erythema, clear discharge noted,  OP with cobblestoning, voice is hoarse.  NECK: supple w/o LA CV: rrr.   PULM: ctab, no inc wob EXT: no edema SKIN: no acute rash but L thumb with erythema and induration proximal to the nail bed.  No findings/erythema/tenderness on the flexor side.  Distally NV intact.  Some prev drainage from the tissue near nail bed noted. IP joint not ttp, red.

## 2011-08-16 NOTE — Patient Instructions (Signed)
Drink plenty of fluids, take tylenol as needed, and gargle with warm salt water for your throat.  Rest your voice.  This should gradually improve.   Soak your finger in warm salt water and start the antibiotics today.  Take care.  Let us know if you have other concerns or if your finger isn't improving.  I would get a flu shot each fall.

## 2011-08-17 ENCOUNTER — Encounter: Payer: Self-pay | Admitting: Family Medicine

## 2011-08-17 NOTE — Assessment & Plan Note (Signed)
Already with some drainage, use warm soaks, start abx and f/u prn.  She agrees.

## 2011-08-17 NOTE — Assessment & Plan Note (Signed)
Likely viral, supportive care.  Nontoxic.  W/o the thumb findings, she wouldn't have indication for abx.  F/u prn.  She agrees.

## 2011-09-07 ENCOUNTER — Telehealth: Payer: Self-pay | Admitting: Internal Medicine

## 2011-09-07 MED ORDER — OXYBUTYNIN CHLORIDE ER 10 MG PO TB24: 10.0000 mg | ORAL_TABLET | Freq: Every day | ORAL | Status: DC

## 2011-09-07 NOTE — Telephone Encounter (Signed)
Patient states that she has to pay $70 for Vesicare and she would like to know if she can switch to either Oxybutynin or the extended release or Trospium These will only cost her $10.

## 2011-09-07 NOTE — Telephone Encounter (Signed)
Can change to oxybutinin - let me know if side eff Px written for call in  -- ? What pharmacy

## 2011-09-07 NOTE — Telephone Encounter (Signed)
Patient notified and Rx sent to [facility] per patient request.

## 2011-11-15 ENCOUNTER — Other Ambulatory Visit: Payer: Self-pay | Admitting: *Deleted

## 2011-11-15 MED ORDER — OXYBUTYNIN CHLORIDE ER 10 MG PO TB24: 10.0000 mg | ORAL_TABLET | Freq: Every day | ORAL | Status: DC

## 2011-11-15 NOTE — Telephone Encounter (Signed)
Patient's husband called stating that the script for Oxybutynin was sent to [facility] for a 30 day supply and it should have been for 90 days. Patient's husband requested that a new script be sent in. Correct [facility] as requested.

## 2012-01-23 ENCOUNTER — Telehealth: Payer: Self-pay | Admitting: Family Medicine

## 2012-01-23 MED ORDER — VALSARTAN-HYDROCHLOROTHIAZIDE 160-12.5 MG PO TABS: 1.0000 | ORAL_TABLET | Freq: Every day | ORAL | Status: DC

## 2012-01-23 MED ORDER — FLUTICASONE PROPIONATE 50 MCG/ACT NA SUSP: 2.0000 | Freq: Every day | NASAL | Status: DC

## 2012-01-23 MED ORDER — HYDROCHLOROTHIAZIDE 25 MG PO TABS: 25.0000 mg | ORAL_TABLET | Freq: Every day | ORAL | Status: DC

## 2012-01-23 MED ORDER — ALLOPURINOL 300 MG PO TABS: 300.0000 mg | ORAL_TABLET | Freq: Every day | ORAL | Status: DC

## 2012-01-23 MED ORDER — ATORVASTATIN CALCIUM 10 MG PO TABS: 10.0000 mg | ORAL_TABLET | Freq: Every day | ORAL | Status: DC

## 2012-01-23 MED ORDER — ESOMEPRAZOLE MAGNESIUM 40 MG PO CPDR: 40.0000 mg | DELAYED_RELEASE_CAPSULE | Freq: Every day | ORAL | Status: DC

## 2012-01-23 NOTE — Telephone Encounter (Signed)
Will refill electronically  

## 2012-01-23 NOTE — Telephone Encounter (Signed)
Requesting refills for [facility] for all meds except Oxybutynin.  The refills are for 90 pills for all prescriptions except for the one that is 3 packs.  Patient's message was interrupted/not clear.  Please call patient back to confirm all med information and refill requests at [phone].

## 2012-01-24 NOTE — Telephone Encounter (Signed)
Patient advised as instructed via telephone. 

## 2012-02-02 ENCOUNTER — Other Ambulatory Visit: Payer: Self-pay

## 2012-02-02 MED ORDER — MELOXICAM 15 MG PO TABS: 15.0000 mg | ORAL_TABLET | Freq: Every day | ORAL | Status: DC

## 2012-02-02 NOTE — Telephone Encounter (Signed)
Pt request refill Meloxicam to [facility]. Pt's hands are weaker and all joints hurting more. Pt wants to know if can increase Meloxicam. Pt has CPX scheduled July 2013.Please advise.

## 2012-02-02 NOTE — Telephone Encounter (Signed)
We can go up from 7.5 to 15 mg - but that may raise bp so we need to watch it  Will refill electronically F/u with me in 4-6 wk please

## 2012-02-03 NOTE — Telephone Encounter (Signed)
[facility], patient advised as instructed via telephone. 

## 2012-03-19 ENCOUNTER — Telehealth: Payer: Self-pay | Admitting: Family Medicine

## 2012-03-19 DIAGNOSIS — E7800 Pure hypercholesterolemia, unspecified: Secondary | ICD-10-CM

## 2012-03-19 DIAGNOSIS — R7309 Other abnormal glucose: Secondary | ICD-10-CM

## 2012-03-19 DIAGNOSIS — I10 Essential (primary) hypertension: Secondary | ICD-10-CM

## 2012-03-19 NOTE — Telephone Encounter (Signed)
Message copied by Judy Pimple on Mon Mar 19, 2012  8:05 AM ------      Message from: Alvina Chou      Created: Wed Mar 14, 2012  3:36 PM      Regarding: Lab orders for Tues,7-16.13       Patient is scheduled for CPX labs, please order future labs, Thanks , Camelia Eng

## 2012-03-20 ENCOUNTER — Other Ambulatory Visit (INDEPENDENT_AMBULATORY_CARE_PROVIDER_SITE_OTHER): Payer: Medicare Other

## 2012-03-20 DIAGNOSIS — E7800 Pure hypercholesterolemia, unspecified: Secondary | ICD-10-CM | POA: Diagnosis not present

## 2012-03-20 DIAGNOSIS — R7309 Other abnormal glucose: Secondary | ICD-10-CM

## 2012-03-20 DIAGNOSIS — I10 Essential (primary) hypertension: Secondary | ICD-10-CM | POA: Diagnosis not present

## 2012-03-20 LAB — COMPREHENSIVE METABOLIC PANEL
ALT: 17 U/L (ref 0–35)
AST: 21 U/L (ref 0–37)
Albumin: 4.2 g/dL (ref 3.5–5.2)
Calcium: 9.6 mg/dL (ref 8.4–10.5)
Chloride: 103 mEq/L (ref 96–112)
Potassium: 3.6 mEq/L (ref 3.5–5.1)
Sodium: 142 mEq/L (ref 135–145)
Total Protein: 7 g/dL (ref 6.0–8.3)

## 2012-03-20 LAB — LIPID PANEL
LDL Cholesterol: 56 mg/dL (ref 0–99)
Total CHOL/HDL Ratio: 2
Triglycerides: 92 mg/dL (ref 0.0–149.0)

## 2012-03-20 LAB — CBC WITH DIFFERENTIAL/PLATELET
Basophils Absolute: 0 10*3/uL (ref 0.0–0.1)
Eosinophils Absolute: 0.4 10*3/uL (ref 0.0–0.7)
Lymphocytes Relative: 22.7 % (ref 12.0–46.0)
MCHC: 33 g/dL (ref 30.0–36.0)
Monocytes Relative: 7.2 % (ref 3.0–12.0)
Neutrophils Relative %: 64.3 % (ref 43.0–77.0)
RBC: 4.07 Mil/uL (ref 3.87–5.11)
RDW: 14.2 % (ref 11.5–14.6)

## 2012-03-20 LAB — TSH: TSH: 3.46 u[IU]/mL (ref 0.35–5.50)

## 2012-03-26 ENCOUNTER — Ambulatory Visit (INDEPENDENT_AMBULATORY_CARE_PROVIDER_SITE_OTHER): Payer: Medicare Other | Admitting: Family Medicine

## 2012-03-26 ENCOUNTER — Encounter: Payer: Self-pay | Admitting: Family Medicine

## 2012-03-26 VITALS — BP 130/72 | HR 64 | Temp 97.8°F | Ht 62.0 in | Wt 203.5 lb

## 2012-03-26 DIAGNOSIS — I10 Essential (primary) hypertension: Secondary | ICD-10-CM | POA: Diagnosis not present

## 2012-03-26 DIAGNOSIS — R7309 Other abnormal glucose: Secondary | ICD-10-CM | POA: Diagnosis not present

## 2012-03-26 DIAGNOSIS — E7800 Pure hypercholesterolemia, unspecified: Secondary | ICD-10-CM | POA: Diagnosis not present

## 2012-03-26 DIAGNOSIS — Z1231 Encounter for screening mammogram for malignant neoplasm of breast: Secondary | ICD-10-CM | POA: Diagnosis not present

## 2012-03-26 MED ORDER — HYDROCHLOROTHIAZIDE 25 MG PO TABS: 25.0000 mg | ORAL_TABLET | Freq: Every day | ORAL | Status: DC

## 2012-03-26 MED ORDER — ALLOPURINOL 300 MG PO TABS: 300.0000 mg | ORAL_TABLET | Freq: Every day | ORAL | Status: DC

## 2012-03-26 MED ORDER — MELOXICAM 15 MG PO TABS: 15.0000 mg | ORAL_TABLET | Freq: Every day | ORAL | Status: DC

## 2012-03-26 NOTE — Assessment & Plan Note (Signed)
Scheduled annual screening mammogram Nl breast exam today  Encouraged monthly self exams   

## 2012-03-26 NOTE — Progress Notes (Signed)
Subjective:    Patient ID: Andrea Peters, female    DOB: 1945/12/04, 66 y.o.   MRN: [id number]  HPI Here for check up of chronic medical conditions and to review health mt list  Is doing well overall  Feels good  Nothing new going on   bp is  goodToday No cp or palpitations or headaches or edema  No side effects to medicines   BP Readings from Last 3 Encounters:  03/26/12 130/72  08/16/11 132/72  07/11/11 138/74    Hyperlipidemia  lipitor and diet  Lab Results  Component Value Date   CHOL 127 03/20/2012   CHOL 116 03/23/2011   CHOL 125 03/15/2010   Lab Results  Component Value Date   HDL 52.30 03/20/2012   HDL 14.78 03/23/2011   HDL 29.56 03/15/2010   Lab Results  Component Value Date   LDLCALC 56 03/20/2012   LDLCALC 55 03/23/2011   LDLCALC 70 03/15/2010   Lab Results  Component Value Date   TRIG 92.0 03/20/2012   TRIG 62.0 03/23/2011   TRIG 77.0 03/15/2010   Lab Results  Component Value Date   CHOLHDL 2 03/20/2012   CHOLHDL 2 03/23/2011   CHOLHDL 3 03/15/2010   Lab Results  Component Value Date   LDLDIRECT 141.9 01/23/2007   is very well controlled Never has eaten sat fats   Wt is up 10 lb with bmi of 37 Not getting enough exercise -- stressed caring for her husband , and cannot walk due to chronic plantar fasciitis  ? What she can do  She can leave him alone for short periods of time   Hyperglycemia  a1c up from 6 to 6.3 Knows she needs to loose wt  Does not eat big portions Watches sugar  Exercise- is the biggest problem   Colon cancer screen --heme card 7/11 Never had colonosc- declines them  Flu shot  mammo 9/12- wants to set that that up   Gyn-does not see a gyn  Had hyst -no cancer  No gyn problems at all   Gout is in good control   Patient Active Problem List  Diagnosis  . HYPERCHOLESTEROLEMIA, PURE  . HYPERTENSION  . ALLERGIC  RHINITIS  . FASCIITIS, PLANTAR  . VERTIGO  . URINARY INCONTINENCE, STRESS  . HYPERGLYCEMIA  . GOUT,  UNSPECIFIED  . CHOLELITHIASIS, HX OF  . Other screening mammogram  . Laryngitis  . Paronychia   Past Medical History  Diagnosis Date  . Hypertension   . Hyperlipidemia   . Obesity   . Hyperglycemia   . Allergic rhinitis   . Mixed incontinence   . Plantar fasciitis     with significant disability  . Tendonitis of foot     L, chronic foot pain   Past Surgical History  Procedure Date  . Abdominal hysterectomy     fibroids, ovaries intact  . Tendon repair 5/07    Tendon rupture L foot   History  Substance Use Topics  . Smoking status: Never Smoker   . Smokeless tobacco: Not on file  . Alcohol Use: No   Family History  Problem Relation Age of Onset  . Heart attack Mother   . Other Mother     ?thyroid problems  . Lung cancer Father    Allergies  Allergen Reactions  . Naproxen Sodium    Current Outpatient Prescriptions on File Prior to Visit  Medication Sig Dispense Refill  . allopurinol (ZYLOPRIM) 300 MG tablet Take 1 tablet (300  mg total) by mouth daily.  90 tablet  3  . aspirin 81 MG tablet Take 81 mg by mouth daily.        Marland Kitchen atorvastatin (LIPITOR) 10 MG tablet Take 1 tablet (10 mg total) by mouth daily.  90 tablet  3  . fluticasone (FLONASE) 50 MCG/ACT nasal spray Place 2 sprays into the nose daily.  48 g  3  . hydrochlorothiazide (HYDRODIURIL) 25 MG tablet Take 1 tablet (25 mg total) by mouth daily.  90 tablet  3  . meloxicam (MOBIC) 15 MG tablet Take 1 tablet (15 mg total) by mouth daily.  90 tablet  0  . Multiple Vitamin (MULTIVITAMIN) tablet Take 2 tablets by mouth daily.       Marland Kitchen oxybutynin (DITROPAN XL) 10 MG 24 hr tablet Take 1 tablet (10 mg total) by mouth daily.  90 tablet  3  . valsartan-hydrochlorothiazide (DIOVAN HCT) 160-12.5 MG per tablet Take 1 tablet by mouth daily.  90 tablet  3  . CALCIUM-VITAMIN D PO Take 1 tablet by mouth daily.        . colchicine 0.6 MG tablet Take 2  tablets now with food and then 1 tablet twice a day with food  90 tablet  2    . esomeprazole (NEXIUM) 40 MG capsule Take 1 capsule (40 mg total) by mouth daily before breakfast.  90 capsule  3  . VITAMIN C, CALCIUM ASCORBATE, PO Take 1 tablet by mouth daily.        Marland Kitchen VITAMIN E PO Take 1 tablet by mouth daily.           Review of Systems Review of Systems  Constitutional: Negative for fever, appetite change, fatigue and unexpected weight change.  Eyes: Negative for pain and visual disturbance.  Respiratory: Negative for cough and shortness of breath.   Cardiovascular: Negative for cp or palpitations    Gastrointestinal: Negative for nausea, diarrhea and constipation.  Genitourinary: Negative for urgency and frequency.  Skin: Negative for pallor or rash   Neurological: Negative for weakness, light-headedness, numbness and headaches.  Hematological: Negative for adenopathy. Does not bruise/bleed easily.  Psychiatric/Behavioral: Negative for dysphoric mood. The patient is not nervous/anxious.         Objective:   Physical Exam  Constitutional: She appears well-developed and well-nourished. No distress.       Obese and well appearing   HENT:  Head: Normocephalic and atraumatic.  Right Ear: External ear normal.  Left Ear: External ear normal.  Nose: Nose normal.  Mouth/Throat: Oropharynx is clear and moist.  Eyes: Conjunctivae and EOM are normal. Pupils are equal, round, and reactive to light. No scleral icterus.  Neck: Normal range of motion. Neck supple. No JVD present. Carotid bruit is not present. No thyromegaly present.  Cardiovascular: Normal rate, regular rhythm, normal heart sounds and intact distal pulses.   Pulmonary/Chest: Effort normal and breath sounds normal. No respiratory distress. She has no wheezes.  Abdominal: Soft. Bowel sounds are normal. She exhibits no distension, no abdominal bruit and no mass. There is no tenderness.  Genitourinary: No breast swelling, tenderness, discharge or bleeding.       Breast exam: No mass, nodules, thickening,  tenderness, bulging, retraction, inflamation, nipple discharge or skin changes noted.  No axillary or clavicular LA.  Chaperoned exam.    Musculoskeletal: She exhibits no edema and no tenderness.  Lymphadenopathy:    She has no cervical adenopathy.  Neurological: She is alert. She has normal reflexes. No  cranial nerve deficit. She exhibits normal muscle tone. Coordination normal.  Skin: Skin is warm and dry. No rash noted. No erythema. No pallor.  Psychiatric: She has a normal mood and affect.          Assessment & Plan:

## 2012-03-26 NOTE — Assessment & Plan Note (Signed)
Getting worse with obesity Disc need for low impact exercise Rev low glycemic diet Rev labs Will f/u lab and f/u in 6 mo Will get flu shot in fall

## 2012-03-26 NOTE — Assessment & Plan Note (Signed)
Very good control with lipitor and diet  Disc goals for lipids and reasons to control them Rev labs with pt Rev low sat fat diet in detail

## 2012-03-26 NOTE — Patient Instructions (Addendum)
Try to find a way to fit in some low impact exercise  We will schedule mammogram at check out  Follow up in 6 months with labs prior

## 2012-03-26 NOTE — Assessment & Plan Note (Signed)
bp in fair control at this time  No changes needed  Disc lifstyle change with low sodium diet and exercise  Labs reviewed in detail  

## 2012-05-15 DIAGNOSIS — Z471 Aftercare following joint replacement surgery: Secondary | ICD-10-CM | POA: Diagnosis not present

## 2012-05-15 DIAGNOSIS — I2510 Atherosclerotic heart disease of native coronary artery without angina pectoris: Secondary | ICD-10-CM | POA: Diagnosis not present

## 2012-05-15 DIAGNOSIS — Z96649 Presence of unspecified artificial hip joint: Secondary | ICD-10-CM | POA: Diagnosis not present

## 2012-05-15 DIAGNOSIS — I4891 Unspecified atrial fibrillation: Secondary | ICD-10-CM | POA: Diagnosis not present

## 2012-05-15 DIAGNOSIS — I10 Essential (primary) hypertension: Secondary | ICD-10-CM | POA: Diagnosis not present

## 2012-05-15 DIAGNOSIS — R269 Unspecified abnormalities of gait and mobility: Secondary | ICD-10-CM | POA: Diagnosis not present

## 2012-05-15 DIAGNOSIS — M6281 Muscle weakness (generalized): Secondary | ICD-10-CM | POA: Diagnosis not present

## 2012-05-15 DIAGNOSIS — I252 Old myocardial infarction: Secondary | ICD-10-CM | POA: Diagnosis not present

## 2012-05-15 DIAGNOSIS — Z5189 Encounter for other specified aftercare: Secondary | ICD-10-CM | POA: Diagnosis not present

## 2012-08-17 DIAGNOSIS — C4360 Malignant melanoma of unspecified upper limb, including shoulder: Secondary | ICD-10-CM | POA: Diagnosis not present

## 2012-08-17 DIAGNOSIS — L819 Disorder of pigmentation, unspecified: Secondary | ICD-10-CM | POA: Diagnosis not present

## 2012-08-17 DIAGNOSIS — D485 Neoplasm of uncertain behavior of skin: Secondary | ICD-10-CM | POA: Diagnosis not present

## 2012-08-17 DIAGNOSIS — C439 Malignant melanoma of skin, unspecified: Secondary | ICD-10-CM

## 2012-08-17 HISTORY — DX: Malignant melanoma of skin, unspecified: C43.9

## 2012-08-23 ENCOUNTER — Telehealth: Payer: Self-pay

## 2012-08-23 DIAGNOSIS — Z01810 Encounter for preprocedural cardiovascular examination: Secondary | ICD-10-CM | POA: Diagnosis not present

## 2012-08-23 DIAGNOSIS — C4360 Malignant melanoma of unspecified upper limb, including shoulder: Secondary | ICD-10-CM | POA: Diagnosis not present

## 2012-08-23 DIAGNOSIS — Z01818 Encounter for other preprocedural examination: Secondary | ICD-10-CM | POA: Diagnosis not present

## 2012-08-23 DIAGNOSIS — I499 Cardiac arrhythmia, unspecified: Secondary | ICD-10-CM | POA: Diagnosis not present

## 2012-08-23 DIAGNOSIS — C439 Malignant melanoma of skin, unspecified: Secondary | ICD-10-CM | POA: Diagnosis not present

## 2012-08-23 NOTE — Telephone Encounter (Signed)
Thanks for letting me know-I will be on the look out for notes  

## 2012-08-23 NOTE — Telephone Encounter (Signed)
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

## 2012-10-02 DIAGNOSIS — C439 Malignant melanoma of skin, unspecified: Secondary | ICD-10-CM | POA: Diagnosis not present

## 2012-10-02 DIAGNOSIS — C773 Secondary and unspecified malignant neoplasm of axilla and upper limb lymph nodes: Secondary | ICD-10-CM | POA: Diagnosis not present

## 2012-10-02 DIAGNOSIS — C4360 Malignant melanoma of unspecified upper limb, including shoulder: Secondary | ICD-10-CM | POA: Diagnosis not present

## 2012-10-03 ENCOUNTER — Other Ambulatory Visit: Payer: Self-pay | Admitting: Family Medicine

## 2012-10-03 MED ORDER — OXYBUTYNIN CHLORIDE ER 10 MG PO TB24: 10.0000 mg | ORAL_TABLET | Freq: Every day | ORAL | Status: DC

## 2012-10-04 ENCOUNTER — Other Ambulatory Visit: Payer: Self-pay | Admitting: *Deleted

## 2012-10-04 MED ORDER — OXYBUTYNIN CHLORIDE ER 10 MG PO TB24: 10.0000 mg | ORAL_TABLET | Freq: Every day | ORAL | Status: DC

## 2012-10-07 ENCOUNTER — Telehealth: Payer: Self-pay | Admitting: Family Medicine

## 2012-10-07 DIAGNOSIS — I10 Essential (primary) hypertension: Secondary | ICD-10-CM

## 2012-10-07 DIAGNOSIS — R7309 Other abnormal glucose: Secondary | ICD-10-CM

## 2012-10-07 DIAGNOSIS — E7800 Pure hypercholesterolemia, unspecified: Secondary | ICD-10-CM

## 2012-10-07 NOTE — Telephone Encounter (Signed)
Message copied by Judy Pimple on Sun Oct 07, 2012 12:27 PM ------      Message from: Andrea Peters      Created: Fri Sep 28, 2012  1:22 PM      Regarding: 6 mo f/u labs Mon 10/08/12       Please order  future f/u labs for pt's upcomming lab appt.      Thanks      Rodney Booze

## 2012-10-08 ENCOUNTER — Other Ambulatory Visit: Payer: Medicare Other

## 2012-10-08 DIAGNOSIS — Z483 Aftercare following surgery for neoplasm: Secondary | ICD-10-CM | POA: Diagnosis not present

## 2012-10-08 DIAGNOSIS — C4360 Malignant melanoma of unspecified upper limb, including shoulder: Secondary | ICD-10-CM | POA: Diagnosis not present

## 2012-10-12 ENCOUNTER — Ambulatory Visit: Payer: Medicare Other | Admitting: Family Medicine

## 2012-10-21 DIAGNOSIS — Z9089 Acquired absence of other organs: Secondary | ICD-10-CM | POA: Diagnosis not present

## 2012-10-21 DIAGNOSIS — IMO0002 Reserved for concepts with insufficient information to code with codable children: Secondary | ICD-10-CM | POA: Diagnosis not present

## 2012-10-21 DIAGNOSIS — Z888 Allergy status to other drugs, medicaments and biological substances status: Secondary | ICD-10-CM | POA: Diagnosis not present

## 2012-10-21 DIAGNOSIS — M129 Arthropathy, unspecified: Secondary | ICD-10-CM | POA: Diagnosis present

## 2012-10-21 DIAGNOSIS — C4360 Malignant melanoma of unspecified upper limb, including shoulder: Secondary | ICD-10-CM | POA: Diagnosis present

## 2012-10-21 DIAGNOSIS — T8140XA Infection following a procedure, unspecified, initial encounter: Secondary | ICD-10-CM | POA: Diagnosis present

## 2012-10-21 DIAGNOSIS — Z886 Allergy status to analgesic agent status: Secondary | ICD-10-CM | POA: Diagnosis not present

## 2012-10-21 DIAGNOSIS — Z79899 Other long term (current) drug therapy: Secondary | ICD-10-CM | POA: Diagnosis not present

## 2012-10-21 DIAGNOSIS — I10 Essential (primary) hypertension: Secondary | ICD-10-CM | POA: Diagnosis present

## 2012-10-21 DIAGNOSIS — Z90710 Acquired absence of both cervix and uterus: Secondary | ICD-10-CM | POA: Diagnosis not present

## 2012-10-21 DIAGNOSIS — N61 Mastitis without abscess: Secondary | ICD-10-CM | POA: Diagnosis not present

## 2012-10-21 DIAGNOSIS — E785 Hyperlipidemia, unspecified: Secondary | ICD-10-CM | POA: Diagnosis present

## 2012-10-22 DIAGNOSIS — T8140XA Infection following a procedure, unspecified, initial encounter: Secondary | ICD-10-CM | POA: Diagnosis not present

## 2012-10-22 DIAGNOSIS — I10 Essential (primary) hypertension: Secondary | ICD-10-CM | POA: Diagnosis not present

## 2012-10-22 DIAGNOSIS — M129 Arthropathy, unspecified: Secondary | ICD-10-CM | POA: Diagnosis not present

## 2012-10-22 DIAGNOSIS — IMO0002 Reserved for concepts with insufficient information to code with codable children: Secondary | ICD-10-CM | POA: Diagnosis not present

## 2012-10-22 DIAGNOSIS — C4360 Malignant melanoma of unspecified upper limb, including shoulder: Secondary | ICD-10-CM | POA: Diagnosis not present

## 2012-10-22 DIAGNOSIS — E785 Hyperlipidemia, unspecified: Secondary | ICD-10-CM | POA: Diagnosis not present

## 2012-10-23 DIAGNOSIS — R599 Enlarged lymph nodes, unspecified: Secondary | ICD-10-CM | POA: Diagnosis not present

## 2012-10-23 DIAGNOSIS — C439 Malignant melanoma of skin, unspecified: Secondary | ICD-10-CM | POA: Diagnosis not present

## 2012-10-23 DIAGNOSIS — C773 Secondary and unspecified malignant neoplasm of axilla and upper limb lymph nodes: Secondary | ICD-10-CM | POA: Diagnosis not present

## 2012-10-24 DIAGNOSIS — R599 Enlarged lymph nodes, unspecified: Secondary | ICD-10-CM | POA: Diagnosis not present

## 2012-10-24 DIAGNOSIS — C439 Malignant melanoma of skin, unspecified: Secondary | ICD-10-CM | POA: Diagnosis not present

## 2012-11-02 DIAGNOSIS — C773 Secondary and unspecified malignant neoplasm of axilla and upper limb lymph nodes: Secondary | ICD-10-CM | POA: Diagnosis not present

## 2012-11-02 DIAGNOSIS — C4360 Malignant melanoma of unspecified upper limb, including shoulder: Secondary | ICD-10-CM | POA: Diagnosis not present

## 2012-11-02 DIAGNOSIS — Z4889 Encounter for other specified surgical aftercare: Secondary | ICD-10-CM | POA: Diagnosis not present

## 2012-11-12 DIAGNOSIS — C439 Malignant melanoma of skin, unspecified: Secondary | ICD-10-CM | POA: Diagnosis not present

## 2012-11-12 DIAGNOSIS — C4360 Malignant melanoma of unspecified upper limb, including shoulder: Secondary | ICD-10-CM | POA: Diagnosis not present

## 2012-12-06 DIAGNOSIS — C482 Malignant neoplasm of peritoneum, unspecified: Secondary | ICD-10-CM | POA: Diagnosis not present

## 2012-12-06 DIAGNOSIS — C439 Malignant melanoma of skin, unspecified: Secondary | ICD-10-CM | POA: Diagnosis not present

## 2012-12-07 ENCOUNTER — Ambulatory Visit: Payer: Medicare Other | Attending: General Surgery | Admitting: Physical Therapy

## 2012-12-07 DIAGNOSIS — M25519 Pain in unspecified shoulder: Secondary | ICD-10-CM | POA: Diagnosis not present

## 2012-12-07 DIAGNOSIS — M24519 Contracture, unspecified shoulder: Secondary | ICD-10-CM | POA: Diagnosis not present

## 2012-12-07 DIAGNOSIS — I890 Lymphedema, not elsewhere classified: Secondary | ICD-10-CM | POA: Diagnosis not present

## 2012-12-07 DIAGNOSIS — IMO0001 Reserved for inherently not codable concepts without codable children: Secondary | ICD-10-CM | POA: Diagnosis not present

## 2012-12-07 DIAGNOSIS — Z85828 Personal history of other malignant neoplasm of skin: Secondary | ICD-10-CM | POA: Insufficient documentation

## 2012-12-10 ENCOUNTER — Other Ambulatory Visit: Payer: Self-pay | Admitting: Family Medicine

## 2012-12-11 ENCOUNTER — Telehealth: Payer: Self-pay | Admitting: Family Medicine

## 2012-12-11 NOTE — Telephone Encounter (Signed)
Confidential Office Message [address] [place], [place] [address] p. [phone] f. [phone] To: [facility] (After Hours Triage) Fax: [phone] From: [facility] Date/ Time: 12/10/2012 5:49 PM Taken By: Jethro BolusHassell Done Facility: not collected Patient: Andrea Peters DOB: 09-23-1945 Phone: [phone] Reason for Call: Pt request New Rx 90 day supply x 1year for medications Oxybutynin ER 10mg ; Fluticasone spray 34mcg120 3 pk; Valsartn/HCT 160/12.5mg ; Atrorvastatin 10mg  . [facility] mail order. PLEASE F/U WITH PT WITH QUESTIONS. THANK YOU. Regarding Appointment: Appt Date: Appt Time: Unknown Provider: Reason: Details: Confidential Outcome:

## 2012-12-12 ENCOUNTER — Ambulatory Visit: Payer: Medicare Other

## 2012-12-12 DIAGNOSIS — I890 Lymphedema, not elsewhere classified: Secondary | ICD-10-CM | POA: Diagnosis not present

## 2012-12-12 DIAGNOSIS — M24519 Contracture, unspecified shoulder: Secondary | ICD-10-CM | POA: Diagnosis not present

## 2012-12-12 DIAGNOSIS — IMO0001 Reserved for inherently not codable concepts without codable children: Secondary | ICD-10-CM | POA: Diagnosis not present

## 2012-12-12 DIAGNOSIS — Z85828 Personal history of other malignant neoplasm of skin: Secondary | ICD-10-CM | POA: Diagnosis not present

## 2012-12-12 DIAGNOSIS — M25519 Pain in unspecified shoulder: Secondary | ICD-10-CM | POA: Diagnosis not present

## 2012-12-14 ENCOUNTER — Ambulatory Visit: Payer: Medicare Other

## 2012-12-14 DIAGNOSIS — Z85828 Personal history of other malignant neoplasm of skin: Secondary | ICD-10-CM | POA: Diagnosis not present

## 2012-12-14 DIAGNOSIS — M25519 Pain in unspecified shoulder: Secondary | ICD-10-CM | POA: Diagnosis not present

## 2012-12-14 DIAGNOSIS — IMO0001 Reserved for inherently not codable concepts without codable children: Secondary | ICD-10-CM | POA: Diagnosis not present

## 2012-12-14 DIAGNOSIS — I890 Lymphedema, not elsewhere classified: Secondary | ICD-10-CM | POA: Diagnosis not present

## 2012-12-14 DIAGNOSIS — M24519 Contracture, unspecified shoulder: Secondary | ICD-10-CM | POA: Diagnosis not present

## 2012-12-24 DIAGNOSIS — E785 Hyperlipidemia, unspecified: Secondary | ICD-10-CM | POA: Diagnosis not present

## 2012-12-24 DIAGNOSIS — D279 Benign neoplasm of unspecified ovary: Secondary | ICD-10-CM | POA: Diagnosis not present

## 2012-12-24 DIAGNOSIS — C439 Malignant melanoma of skin, unspecified: Secondary | ICD-10-CM | POA: Diagnosis not present

## 2012-12-24 DIAGNOSIS — M129 Arthropathy, unspecified: Secondary | ICD-10-CM | POA: Diagnosis not present

## 2012-12-24 DIAGNOSIS — D3910 Neoplasm of uncertain behavior of unspecified ovary: Secondary | ICD-10-CM | POA: Diagnosis not present

## 2012-12-24 DIAGNOSIS — N736 Female pelvic peritoneal adhesions (postinfective): Secondary | ICD-10-CM | POA: Diagnosis not present

## 2012-12-24 DIAGNOSIS — C4360 Malignant melanoma of unspecified upper limb, including shoulder: Secondary | ICD-10-CM | POA: Diagnosis not present

## 2012-12-24 DIAGNOSIS — Z6836 Body mass index (BMI) 36.0-36.9, adult: Secondary | ICD-10-CM | POA: Diagnosis not present

## 2012-12-24 DIAGNOSIS — I10 Essential (primary) hypertension: Secondary | ICD-10-CM | POA: Diagnosis not present

## 2012-12-24 DIAGNOSIS — C801 Malignant (primary) neoplasm, unspecified: Secondary | ICD-10-CM | POA: Diagnosis not present

## 2012-12-24 DIAGNOSIS — N838 Other noninflammatory disorders of ovary, fallopian tube and broad ligament: Secondary | ICD-10-CM | POA: Diagnosis not present

## 2012-12-24 DIAGNOSIS — E669 Obesity, unspecified: Secondary | ICD-10-CM | POA: Diagnosis not present

## 2013-01-03 DIAGNOSIS — C4360 Malignant melanoma of unspecified upper limb, including shoulder: Secondary | ICD-10-CM | POA: Diagnosis not present

## 2013-01-09 ENCOUNTER — Ambulatory Visit: Payer: Medicare Other | Attending: General Surgery | Admitting: Physical Therapy

## 2013-01-09 DIAGNOSIS — M25519 Pain in unspecified shoulder: Secondary | ICD-10-CM | POA: Diagnosis not present

## 2013-01-09 DIAGNOSIS — IMO0001 Reserved for inherently not codable concepts without codable children: Secondary | ICD-10-CM | POA: Insufficient documentation

## 2013-01-09 DIAGNOSIS — M24519 Contracture, unspecified shoulder: Secondary | ICD-10-CM | POA: Diagnosis not present

## 2013-01-09 DIAGNOSIS — I890 Lymphedema, not elsewhere classified: Secondary | ICD-10-CM | POA: Insufficient documentation

## 2013-01-09 DIAGNOSIS — Z85828 Personal history of other malignant neoplasm of skin: Secondary | ICD-10-CM | POA: Diagnosis not present

## 2013-01-11 ENCOUNTER — Ambulatory Visit: Payer: Medicare Other

## 2013-01-11 DIAGNOSIS — M24519 Contracture, unspecified shoulder: Secondary | ICD-10-CM | POA: Diagnosis not present

## 2013-01-11 DIAGNOSIS — M25519 Pain in unspecified shoulder: Secondary | ICD-10-CM | POA: Diagnosis not present

## 2013-01-11 DIAGNOSIS — I890 Lymphedema, not elsewhere classified: Secondary | ICD-10-CM | POA: Diagnosis not present

## 2013-01-11 DIAGNOSIS — Z85828 Personal history of other malignant neoplasm of skin: Secondary | ICD-10-CM | POA: Diagnosis not present

## 2013-01-11 DIAGNOSIS — IMO0001 Reserved for inherently not codable concepts without codable children: Secondary | ICD-10-CM | POA: Diagnosis not present

## 2013-01-15 ENCOUNTER — Ambulatory Visit: Payer: Medicare Other | Admitting: Physical Therapy

## 2013-01-15 DIAGNOSIS — M24519 Contracture, unspecified shoulder: Secondary | ICD-10-CM | POA: Diagnosis not present

## 2013-01-15 DIAGNOSIS — IMO0001 Reserved for inherently not codable concepts without codable children: Secondary | ICD-10-CM | POA: Diagnosis not present

## 2013-01-15 DIAGNOSIS — I890 Lymphedema, not elsewhere classified: Secondary | ICD-10-CM | POA: Diagnosis not present

## 2013-01-15 DIAGNOSIS — Z85828 Personal history of other malignant neoplasm of skin: Secondary | ICD-10-CM | POA: Diagnosis not present

## 2013-01-15 DIAGNOSIS — M25519 Pain in unspecified shoulder: Secondary | ICD-10-CM | POA: Diagnosis not present

## 2013-01-18 ENCOUNTER — Ambulatory Visit: Payer: Medicare Other

## 2013-01-18 DIAGNOSIS — M25519 Pain in unspecified shoulder: Secondary | ICD-10-CM | POA: Diagnosis not present

## 2013-01-18 DIAGNOSIS — I890 Lymphedema, not elsewhere classified: Secondary | ICD-10-CM | POA: Diagnosis not present

## 2013-01-18 DIAGNOSIS — M24519 Contracture, unspecified shoulder: Secondary | ICD-10-CM | POA: Diagnosis not present

## 2013-01-18 DIAGNOSIS — Z85828 Personal history of other malignant neoplasm of skin: Secondary | ICD-10-CM | POA: Diagnosis not present

## 2013-01-18 DIAGNOSIS — IMO0001 Reserved for inherently not codable concepts without codable children: Secondary | ICD-10-CM | POA: Diagnosis not present

## 2013-01-21 ENCOUNTER — Ambulatory Visit: Payer: Medicare Other | Admitting: Physical Therapy

## 2013-01-21 DIAGNOSIS — I890 Lymphedema, not elsewhere classified: Secondary | ICD-10-CM | POA: Diagnosis not present

## 2013-01-21 DIAGNOSIS — Z85828 Personal history of other malignant neoplasm of skin: Secondary | ICD-10-CM | POA: Diagnosis not present

## 2013-01-21 DIAGNOSIS — M25519 Pain in unspecified shoulder: Secondary | ICD-10-CM | POA: Diagnosis not present

## 2013-01-21 DIAGNOSIS — C4360 Malignant melanoma of unspecified upper limb, including shoulder: Secondary | ICD-10-CM | POA: Diagnosis not present

## 2013-01-21 DIAGNOSIS — IMO0001 Reserved for inherently not codable concepts without codable children: Secondary | ICD-10-CM | POA: Diagnosis not present

## 2013-01-21 DIAGNOSIS — M24519 Contracture, unspecified shoulder: Secondary | ICD-10-CM | POA: Diagnosis not present

## 2013-01-24 ENCOUNTER — Ambulatory Visit: Payer: Medicare Other | Admitting: Physical Therapy

## 2013-01-24 DIAGNOSIS — M25519 Pain in unspecified shoulder: Secondary | ICD-10-CM | POA: Diagnosis not present

## 2013-01-24 DIAGNOSIS — I890 Lymphedema, not elsewhere classified: Secondary | ICD-10-CM | POA: Diagnosis not present

## 2013-01-24 DIAGNOSIS — IMO0001 Reserved for inherently not codable concepts without codable children: Secondary | ICD-10-CM | POA: Diagnosis not present

## 2013-01-24 DIAGNOSIS — Z85828 Personal history of other malignant neoplasm of skin: Secondary | ICD-10-CM | POA: Diagnosis not present

## 2013-01-24 DIAGNOSIS — M24519 Contracture, unspecified shoulder: Secondary | ICD-10-CM | POA: Diagnosis not present

## 2013-01-29 ENCOUNTER — Ambulatory Visit: Payer: Medicare Other | Admitting: Physical Therapy

## 2013-01-29 DIAGNOSIS — IMO0001 Reserved for inherently not codable concepts without codable children: Secondary | ICD-10-CM | POA: Diagnosis not present

## 2013-01-29 DIAGNOSIS — Z85828 Personal history of other malignant neoplasm of skin: Secondary | ICD-10-CM | POA: Diagnosis not present

## 2013-01-29 DIAGNOSIS — M25519 Pain in unspecified shoulder: Secondary | ICD-10-CM | POA: Diagnosis not present

## 2013-01-29 DIAGNOSIS — M24519 Contracture, unspecified shoulder: Secondary | ICD-10-CM | POA: Diagnosis not present

## 2013-01-29 DIAGNOSIS — I890 Lymphedema, not elsewhere classified: Secondary | ICD-10-CM | POA: Diagnosis not present

## 2013-01-31 ENCOUNTER — Ambulatory Visit: Payer: Medicare Other | Admitting: Physical Therapy

## 2013-01-31 DIAGNOSIS — IMO0001 Reserved for inherently not codable concepts without codable children: Secondary | ICD-10-CM | POA: Diagnosis not present

## 2013-01-31 DIAGNOSIS — I890 Lymphedema, not elsewhere classified: Secondary | ICD-10-CM | POA: Diagnosis not present

## 2013-01-31 DIAGNOSIS — M24519 Contracture, unspecified shoulder: Secondary | ICD-10-CM | POA: Diagnosis not present

## 2013-01-31 DIAGNOSIS — Z85828 Personal history of other malignant neoplasm of skin: Secondary | ICD-10-CM | POA: Diagnosis not present

## 2013-01-31 DIAGNOSIS — M25519 Pain in unspecified shoulder: Secondary | ICD-10-CM | POA: Diagnosis not present

## 2013-02-04 ENCOUNTER — Ambulatory Visit: Payer: Medicare Other | Attending: General Surgery | Admitting: Physical Therapy

## 2013-02-04 DIAGNOSIS — M24519 Contracture, unspecified shoulder: Secondary | ICD-10-CM | POA: Insufficient documentation

## 2013-02-04 DIAGNOSIS — IMO0001 Reserved for inherently not codable concepts without codable children: Secondary | ICD-10-CM | POA: Diagnosis not present

## 2013-02-04 DIAGNOSIS — I890 Lymphedema, not elsewhere classified: Secondary | ICD-10-CM | POA: Diagnosis not present

## 2013-02-04 DIAGNOSIS — Z85828 Personal history of other malignant neoplasm of skin: Secondary | ICD-10-CM | POA: Insufficient documentation

## 2013-02-04 DIAGNOSIS — M25519 Pain in unspecified shoulder: Secondary | ICD-10-CM | POA: Insufficient documentation

## 2013-02-06 DIAGNOSIS — D485 Neoplasm of uncertain behavior of skin: Secondary | ICD-10-CM | POA: Diagnosis not present

## 2013-02-06 DIAGNOSIS — D239 Other benign neoplasm of skin, unspecified: Secondary | ICD-10-CM | POA: Diagnosis not present

## 2013-02-06 DIAGNOSIS — Z85820 Personal history of malignant melanoma of skin: Secondary | ICD-10-CM | POA: Diagnosis not present

## 2013-02-06 HISTORY — DX: Other benign neoplasm of skin, unspecified: D23.9

## 2013-02-07 ENCOUNTER — Ambulatory Visit: Payer: Medicare Other | Admitting: Physical Therapy

## 2013-02-18 ENCOUNTER — Ambulatory Visit: Payer: Medicare Other | Admitting: Physical Therapy

## 2013-02-21 ENCOUNTER — Ambulatory Visit: Payer: Medicare Other

## 2013-02-25 ENCOUNTER — Ambulatory Visit: Payer: Medicare Other | Admitting: Physical Therapy

## 2013-02-28 ENCOUNTER — Ambulatory Visit: Payer: Medicare Other | Admitting: Physical Therapy

## 2013-03-04 ENCOUNTER — Ambulatory Visit: Payer: Medicare Other

## 2013-03-06 ENCOUNTER — Ambulatory Visit: Payer: Medicare Other | Admitting: Physical Therapy

## 2013-03-07 ENCOUNTER — Encounter: Payer: Medicare Other | Admitting: Physical Therapy

## 2013-03-08 ENCOUNTER — Other Ambulatory Visit: Payer: Self-pay | Admitting: Family Medicine

## 2013-03-11 NOTE — Telephone Encounter (Signed)
Refill for a month in case she is out-thanks

## 2013-03-11 NOTE — Telephone Encounter (Signed)
done

## 2013-03-11 NOTE — Telephone Encounter (Signed)
Electronic refill request, no recent appt but pt has a medication management appt on 03/13/13, should I refill now or wait until appt, please advise

## 2013-03-13 ENCOUNTER — Ambulatory Visit (INDEPENDENT_AMBULATORY_CARE_PROVIDER_SITE_OTHER): Payer: Medicare Other | Admitting: Family Medicine

## 2013-03-13 ENCOUNTER — Encounter: Payer: Self-pay | Admitting: Family Medicine

## 2013-03-13 VITALS — BP 124/76 | HR 70 | Temp 98.2°F | Ht 62.0 in | Wt 197.5 lb

## 2013-03-13 DIAGNOSIS — R29898 Other symptoms and signs involving the musculoskeletal system: Secondary | ICD-10-CM

## 2013-03-13 DIAGNOSIS — M109 Gout, unspecified: Secondary | ICD-10-CM | POA: Diagnosis not present

## 2013-03-13 DIAGNOSIS — E7800 Pure hypercholesterolemia, unspecified: Secondary | ICD-10-CM

## 2013-03-13 DIAGNOSIS — I10 Essential (primary) hypertension: Secondary | ICD-10-CM | POA: Diagnosis not present

## 2013-03-13 DIAGNOSIS — R7309 Other abnormal glucose: Secondary | ICD-10-CM

## 2013-03-13 LAB — CBC WITH DIFFERENTIAL/PLATELET
Basophils Relative: 0.5 % (ref 0.0–3.0)
Eosinophils Relative: 4.5 % (ref 0.0–5.0)
Hemoglobin: 12.9 g/dL (ref 12.0–15.0)
Lymphocytes Relative: 24.5 % (ref 12.0–46.0)
MCV: 94.8 fl (ref 78.0–100.0)
Monocytes Absolute: 0.5 10*3/uL (ref 0.1–1.0)
Neutro Abs: 3.8 10*3/uL (ref 1.4–7.7)
Neutrophils Relative %: 62.4 % (ref 43.0–77.0)
RBC: 4.09 Mil/uL (ref 3.87–5.11)
WBC: 6 10*3/uL (ref 4.5–10.5)

## 2013-03-13 LAB — COMPREHENSIVE METABOLIC PANEL
Albumin: 4.3 g/dL (ref 3.5–5.2)
BUN: 28 mg/dL — ABNORMAL HIGH (ref 6–23)
Calcium: 9.8 mg/dL (ref 8.4–10.5)
Chloride: 103 mEq/L (ref 96–112)
Creatinine, Ser: 1.1 mg/dL (ref 0.4–1.2)
GFR: 52.41 mL/min — ABNORMAL LOW (ref >60.00)
Glucose, Bld: 108 mg/dL — ABNORMAL HIGH (ref 70–99)
Potassium: 3.8 mEq/L (ref 3.5–5.1)

## 2013-03-13 LAB — CK: Total CK: 131 U/L (ref 7–177)

## 2013-03-13 LAB — LIPID PANEL
Cholesterol: 121 mg/dL (ref 0–200)
Triglycerides: 83 mg/dL (ref 0.0–149.0)

## 2013-03-13 LAB — HEMOGLOBIN A1C: Hgb A1c MFr Bld: 6.3 % (ref 4.6–6.5)

## 2013-03-13 LAB — TSH: TSH: 1.97 u[IU]/mL (ref 0.35–5.50)

## 2013-03-13 MED ORDER — VALSARTAN-HYDROCHLOROTHIAZIDE 160-12.5 MG PO TABS: 1.0000 | ORAL_TABLET | Freq: Every day | ORAL | Status: DC

## 2013-03-13 MED ORDER — FLUTICASONE PROPIONATE 50 MCG/ACT NA SUSP: 2.0000 | Freq: Every day | NASAL | Status: DC

## 2013-03-13 MED ORDER — HYDROCHLOROTHIAZIDE 25 MG PO TABS: 25.0000 mg | ORAL_TABLET | Freq: Every day | ORAL | Status: DC

## 2013-03-13 MED ORDER — MELOXICAM 15 MG PO TABS: 15.0000 mg | ORAL_TABLET | Freq: Every day | ORAL | Status: DC

## 2013-03-13 MED ORDER — ALLOPURINOL 300 MG PO TABS: 300.0000 mg | ORAL_TABLET | Freq: Every day | ORAL | Status: DC

## 2013-03-13 MED ORDER — OXYBUTYNIN CHLORIDE ER 10 MG PO TB24: 10.0000 mg | ORAL_TABLET | Freq: Every day | ORAL | Status: AC

## 2013-03-13 NOTE — Progress Notes (Signed)
Subjective:    Patient ID: Andrea Peters, female    DOB: 19-Sep-1945, 67 y.o.   MRN: [id number]  HPI Here for f/u of chronic medical problems  Wt is down 6 lb with bmi of 36  Has been feeling pretty good overall   Has been more weak than usual - legs feel heavy when she walks  More than just being out of shape  No pain or muscle ache  She has done very well overall for the most part in the past couple of weeks  Is generally exhausted  Has also done a lot   She has a little knot on her elbow (watches that at therapy)  No trauma No gout lately   Time for labs today   bp is stable today  No cp or palpitations or headaches or edema  No side effects to medicines  BP Readings from Last 3 Encounters:  03/13/13 124/76  03/26/12 130/72  08/16/11 132/72      Hyperlipidemia  Due for labs  lipitor and diet  Is eating a healthy diet - stays away from fats and sugar as well  Making an effort    Hyperglycemia Lab Results  Component Value Date   HGBA1C 6.3 03/20/2012   no excess thirst but her mouth is dry from medicine   Patient Active Problem List   Diagnosis Date Noted  . Laryngitis 08/16/2011  . Other screening mammogram 03/30/2011  . GOUT, UNSPECIFIED 11/17/2010  . CHOLELITHIASIS, HX OF 11/17/2010  . HYPERCHOLESTEROLEMIA, PURE 05/17/2007  . HYPERGLYCEMIA 05/17/2007  . ALLERGIC  RHINITIS 01/23/2007  . HYPERTENSION 12/20/2006  . FASCIITIS, PLANTAR 12/20/2006  . VERTIGO 12/20/2006  . URINARY INCONTINENCE, STRESS 12/20/2006   Past Medical History  Diagnosis Date  . Hypertension   . Hyperlipidemia   . Obesity   . Hyperglycemia   . Allergic rhinitis   . Mixed incontinence   . Plantar fasciitis     with significant disability  . Tendonitis of foot     L, chronic foot pain   Past Surgical History  Procedure Laterality Date  . Abdominal hysterectomy      fibroids, ovaries intact  . Tendon repair  5/07    Tendon rupture L foot   History  Substance Use  Topics  . Smoking status: Never Smoker   . Smokeless tobacco: Not on file  . Alcohol Use: No   Family History  Problem Relation Age of Onset  . Heart attack Mother   . Other Mother     ?thyroid problems  . Lung cancer Father    Allergies  Allergen Reactions  . Naproxen Sodium    Current Outpatient Prescriptions on File Prior to Visit  Medication Sig Dispense Refill  . allopurinol (ZYLOPRIM) 300 MG tablet TAKE 1 TABLET (300 MG TOTAL) BY MOUTH DAILY.  30 tablet  0  . aspirin 81 MG tablet Take 81 mg by mouth daily.        Marland Kitchen atorvastatin (LIPITOR) 10 MG tablet Take 1 tablet (10 mg total) by mouth daily.  90 tablet  3  . CALCIUM-VITAMIN D PO Take 1 tablet by mouth daily.        . fluticasone (FLONASE) 50 MCG/ACT nasal spray USE 2 SPRAYS NASALLY DAILY AS DIRECTED  48 g  1  . hydrochlorothiazide (HYDRODIURIL) 25 MG tablet TAKE 1 TABLET (25 MG TOTAL) BY MOUTH DAILY.  30 tablet  0  . meloxicam (MOBIC) 15 MG tablet TAKE 1 TABLET (15 MG TOTAL)  BY MOUTH DAILY.  30 tablet  0  . Multiple Vitamin (MULTIVITAMIN) tablet Take 2 tablets by mouth daily.       Marland Kitchen oxybutynin (DITROPAN XL) 10 MG 24 hr tablet Take 1 tablet (10 mg total) by mouth daily.  90 tablet  1  . valsartan-hydrochlorothiazide (DIOVAN HCT) 160-12.5 MG per tablet Take 1 tablet by mouth daily.  90 tablet  3  . [DISCONTINUED] oxybutynin (DITROPAN XL) 10 MG 24 hr tablet Take 1 tablet (10 mg total) by mouth daily.  90 tablet  3   No current facility-administered medications on file prior to visit.      Review of Systems Review of Systems  Constitutional: Negative for fever, appetite change, fatigue and unexpected weight change.  Eyes: Negative for pain and visual disturbance.  Respiratory: Negative for cough and shortness of breath.   Cardiovascular: Negative for cp or palpitations    Gastrointestinal: Negative for nausea, diarrhea and constipation.  Genitourinary: Negative for urgency and frequency.  Skin: Negative for pallor or  rash   MSK pos for some generalized aches and pains Neurological: Negative for  light-headedness, numbness and headaches. pos for generalized weakness in legs -bilat  Hematological: Negative for adenopathy. Does not bruise/bleed easily.  Psychiatric/Behavioral: Negative for dysphoric mood. The patient is not nervous/anxious.         Objective:   Physical Exam  Constitutional: She appears well-developed and well-nourished. No distress.  obese and well appearing   HENT:  Head: Normocephalic and atraumatic.  Mouth/Throat: Oropharynx is clear and moist.  Eyes: Conjunctivae and EOM are normal. Pupils are equal, round, and reactive to light. Right eye exhibits no discharge. Left eye exhibits no discharge. No scleral icterus.  Neck: Normal range of motion. Neck supple. No JVD present. Carotid bruit is not present. No thyromegaly present.  Cardiovascular: Normal rate, regular rhythm, normal heart sounds and intact distal pulses.  Exam reveals no gallop.   Pulmonary/Chest: Effort normal and breath sounds normal. No respiratory distress. She has no wheezes. She has no rales.  No crackles   Abdominal: Soft. Bowel sounds are normal. She exhibits no distension and no abdominal bruit. There is no tenderness. There is no rebound.  Musculoskeletal: She exhibits no edema and no tenderness.  Lymphadenopathy:    She has no cervical adenopathy.  Neurological: She is alert. She has normal reflexes. No cranial nerve deficit. She exhibits normal muscle tone. Coordination normal.  Skin: Skin is warm and dry. No rash noted. No erythema. No pallor.  Psychiatric: She has a normal mood and affect.          Assessment & Plan:

## 2013-03-13 NOTE — Assessment & Plan Note (Signed)
a1c today Disc imp of wt loss and low glycemic diet

## 2013-03-13 NOTE — Assessment & Plan Note (Signed)
On allopurinol for prev Check cmet and uric acid No c/o

## 2013-03-13 NOTE — Patient Instructions (Addendum)
Hold your lipitor for 2 weeks and then update me with how you are feeling  Try to gradually increase exercise and keep working on weight loss Labs today Schedule annual exam in feb with labs prior

## 2013-03-13 NOTE — Assessment & Plan Note (Signed)
Pt will hold lipitor due to subjective leg weakness -and check cpk Update 2 wk Lipid today Rev low sat fat diet

## 2013-03-13 NOTE — Assessment & Plan Note (Signed)
bp in fair control at this time  No changes needed  Disc lifstyle change with low sodium diet and exercise  Lab today 

## 2013-03-13 NOTE — Assessment & Plan Note (Signed)
Hold lipitor Subjective leg weakness cpk today Update 2 wk Could also be deconditioning from recent melanoma proceedures

## 2013-03-14 ENCOUNTER — Encounter: Payer: Self-pay | Admitting: *Deleted

## 2013-03-27 ENCOUNTER — Telehealth: Payer: Self-pay

## 2013-03-27 NOTE — Telephone Encounter (Signed)
Have her check on coverage of simvastatin and crestor please  (I would pref crestor but it will likely be more expensive)

## 2013-03-27 NOTE — Telephone Encounter (Signed)
Pt left v/m; pt recently stopped atorvastatin to see if legs are better and her legs are better. Pt request cb with name of medicine Dr Milinda Antis will put pt on; pt will ck with local and mail order pharmacy and then let Dr Milinda Antis know which pharmacy she will use.

## 2013-03-28 NOTE — Telephone Encounter (Signed)
Pt left v/m requesting cb with dosage of crestor and simvastatin.

## 2013-03-28 NOTE — Telephone Encounter (Signed)
Left voicemail requesting pt to check insurance and see if they cover simvastatin and crestor and let us know which one they cover

## 2013-03-29 MED ORDER — SIMVASTATIN 20 MG PO TABS: 20.0000 mg | ORAL_TABLET | Freq: Every day | ORAL | Status: DC

## 2013-03-29 NOTE — Telephone Encounter (Signed)
Pt notified crestor 5 mg and simvastatin 20 mg, pt will check ins and call us back on monday

## 2013-03-29 NOTE — Telephone Encounter (Signed)
Pt left v/m requesting Simvastatin 20 mg 90 day supply to [facility].

## 2013-03-29 NOTE — Telephone Encounter (Signed)
crestor 5 mg or simvastatin 20

## 2013-03-29 NOTE — Telephone Encounter (Signed)
Sending it now  Please check fasting lipid/ast/alt in 6-8 weeks  Let me know if any problems or side effects

## 2013-04-01 MED ORDER — SIMVASTATIN 20 MG PO TABS: 20.0000 mg | ORAL_TABLET | Freq: Every day | ORAL | Status: DC

## 2013-04-01 NOTE — Telephone Encounter (Signed)
Lab appt scheduled and Rx changed to 90 day supply per pt request

## 2013-05-08 DIAGNOSIS — C4360 Malignant melanoma of unspecified upper limb, including shoulder: Secondary | ICD-10-CM | POA: Insufficient documentation

## 2013-05-20 ENCOUNTER — Other Ambulatory Visit (INDEPENDENT_AMBULATORY_CARE_PROVIDER_SITE_OTHER): Payer: Medicare Other

## 2013-05-20 DIAGNOSIS — E785 Hyperlipidemia, unspecified: Secondary | ICD-10-CM

## 2013-05-20 DIAGNOSIS — I10 Essential (primary) hypertension: Secondary | ICD-10-CM | POA: Diagnosis not present

## 2013-05-20 DIAGNOSIS — E7800 Pure hypercholesterolemia, unspecified: Secondary | ICD-10-CM | POA: Diagnosis not present

## 2013-05-20 LAB — LIPID PANEL
Cholesterol: 126 mg/dL (ref 0–200)
HDL: 42 mg/dL (ref >39.00)
LDL Cholesterol: 66 mg/dL (ref 0–99)
VLDL: 17.8 mg/dL (ref 0.0–40.0)

## 2013-05-21 ENCOUNTER — Encounter: Payer: Self-pay | Admitting: *Deleted

## 2013-08-14 DIAGNOSIS — D239 Other benign neoplasm of skin, unspecified: Secondary | ICD-10-CM | POA: Diagnosis not present

## 2013-08-14 DIAGNOSIS — Z85820 Personal history of malignant melanoma of skin: Secondary | ICD-10-CM | POA: Diagnosis not present

## 2013-08-14 DIAGNOSIS — L819 Disorder of pigmentation, unspecified: Secondary | ICD-10-CM | POA: Diagnosis not present

## 2013-10-08 ENCOUNTER — Telehealth: Payer: Self-pay | Admitting: Family Medicine

## 2013-10-08 ENCOUNTER — Other Ambulatory Visit (INDEPENDENT_AMBULATORY_CARE_PROVIDER_SITE_OTHER): Payer: Medicare Other

## 2013-10-08 ENCOUNTER — Encounter: Payer: Medicare Other | Admitting: Family Medicine

## 2013-10-08 DIAGNOSIS — I10 Essential (primary) hypertension: Secondary | ICD-10-CM

## 2013-10-08 DIAGNOSIS — R7309 Other abnormal glucose: Secondary | ICD-10-CM

## 2013-10-08 DIAGNOSIS — M109 Gout, unspecified: Secondary | ICD-10-CM

## 2013-10-08 DIAGNOSIS — E7800 Pure hypercholesterolemia, unspecified: Secondary | ICD-10-CM | POA: Diagnosis not present

## 2013-10-08 LAB — COMPREHENSIVE METABOLIC PANEL
ALBUMIN: 4.2 g/dL (ref 3.5–5.2)
ALT: 17 U/L (ref 0–35)
AST: 20 U/L (ref 0–37)
Alkaline Phosphatase: 57 U/L (ref 39–117)
BUN: 23 mg/dL (ref 6–23)
CALCIUM: 10.2 mg/dL (ref 8.4–10.5)
CHLORIDE: 100 meq/L (ref 96–112)
CO2: 31 meq/L (ref 19–32)
CREATININE: 0.9 mg/dL (ref 0.4–1.2)
GFR: 64.3 mL/min (ref >60.00)
Glucose, Bld: 85 mg/dL (ref 70–99)
Potassium: 3.8 mEq/L (ref 3.5–5.1)
Sodium: 140 mEq/L (ref 135–145)
Total Bilirubin: 1 mg/dL (ref 0.3–1.2)
Total Protein: 6.9 g/dL (ref 6.0–8.3)

## 2013-10-08 LAB — CBC WITH DIFFERENTIAL/PLATELET
BASOS PCT: 0.5 % (ref 0.0–3.0)
Basophils Absolute: 0 10*3/uL (ref 0.0–0.1)
EOS PCT: 4 % (ref 0.0–5.0)
Eosinophils Absolute: 0.3 10*3/uL (ref 0.0–0.7)
HCT: 40.1 % (ref 36.0–46.0)
HEMOGLOBIN: 12.9 g/dL (ref 12.0–15.0)
LYMPHS PCT: 25.1 % (ref 12.0–46.0)
Lymphs Abs: 1.6 10*3/uL (ref 0.7–4.0)
MCHC: 32.2 g/dL (ref 30.0–36.0)
MCV: 97.1 fl (ref 78.0–100.0)
MONOS PCT: 7.9 % (ref 3.0–12.0)
Monocytes Absolute: 0.5 10*3/uL (ref 0.1–1.0)
NEUTROS ABS: 4 10*3/uL (ref 1.4–7.7)
Neutrophils Relative %: 62.5 % (ref 43.0–77.0)
Platelets: 239 10*3/uL (ref 150.0–400.0)
RBC: 4.13 Mil/uL (ref 3.87–5.11)
RDW: 14.7 % — ABNORMAL HIGH (ref 11.5–14.6)
WBC: 6.5 10*3/uL (ref 4.5–10.5)

## 2013-10-08 LAB — LIPID PANEL
CHOL/HDL RATIO: 3
Cholesterol: 128 mg/dL (ref 0–200)
HDL: 48.7 mg/dL (ref >39.00)
LDL Cholesterol: 66 mg/dL (ref 0–99)
Triglycerides: 66 mg/dL (ref 0.0–149.0)
VLDL: 13.2 mg/dL (ref 0.0–40.0)

## 2013-10-08 LAB — URIC ACID: Uric Acid, Serum: 5.4 mg/dL (ref 2.4–7.0)

## 2013-10-08 LAB — TSH: TSH: 3.28 u[IU]/mL (ref 0.35–5.50)

## 2013-10-08 LAB — HEMOGLOBIN A1C: Hgb A1c MFr Bld: 6.1 % (ref 4.6–6.5)

## 2013-10-08 NOTE — Telephone Encounter (Signed)
Message copied by Abner Greenspan on Tue Oct 08, 2013 12:38 PM ------      Message from: Ellamae Sia      Created: Tue Oct 08, 2013  9:05 AM      Regarding: lab orders for now       Patient is scheduled for CPX labs, please order future labs, Thanks , Terri       ------

## 2013-10-15 ENCOUNTER — Encounter: Payer: Self-pay | Admitting: Family Medicine

## 2013-10-15 ENCOUNTER — Ambulatory Visit (INDEPENDENT_AMBULATORY_CARE_PROVIDER_SITE_OTHER)
Admission: RE | Admit: 2013-10-15 | Discharge: 2013-10-15 | Disposition: A | Discharge status: Home / Self Care | Payer: Medicare Other | Source: Ambulatory Visit | Attending: Family Medicine | Admitting: Family Medicine

## 2013-10-15 ENCOUNTER — Ambulatory Visit (INDEPENDENT_AMBULATORY_CARE_PROVIDER_SITE_OTHER): Payer: Medicare Other | Admitting: Family Medicine

## 2013-10-15 VITALS — BP 124/82 | HR 61 | Temp 98.3°F | Ht 61.5 in | Wt 192.0 lb

## 2013-10-15 DIAGNOSIS — R079 Chest pain, unspecified: Secondary | ICD-10-CM | POA: Diagnosis not present

## 2013-10-15 DIAGNOSIS — R7309 Other abnormal glucose: Secondary | ICD-10-CM | POA: Diagnosis not present

## 2013-10-15 DIAGNOSIS — I10 Essential (primary) hypertension: Secondary | ICD-10-CM | POA: Diagnosis not present

## 2013-10-15 DIAGNOSIS — R0789 Other chest pain: Secondary | ICD-10-CM

## 2013-10-15 DIAGNOSIS — Z1211 Encounter for screening for malignant neoplasm of colon: Secondary | ICD-10-CM | POA: Diagnosis not present

## 2013-10-15 DIAGNOSIS — Z0000 Encounter for general adult medical examination without abnormal findings: Secondary | ICD-10-CM | POA: Insufficient documentation

## 2013-10-15 DIAGNOSIS — E7800 Pure hypercholesterolemia, unspecified: Secondary | ICD-10-CM

## 2013-10-15 DIAGNOSIS — R071 Chest pain on breathing: Secondary | ICD-10-CM

## 2013-10-15 NOTE — Progress Notes (Signed)
Pre-visit discussion using our clinic review tool. No additional management support is needed unless otherwise documented below in the visit note.  

## 2013-10-15 NOTE — Progress Notes (Signed)
Subjective:    Patient ID: Andrea Peters, female    DOB: [birth year], 68 y.o.   MRN: [id number]  HPI I have personally reviewed the Medicare Annual Wellness questionnaire and have noted 1. The patient's medical and social history 2. Their use of alcohol, tobacco or illicit drugs 3. Their current medications and supplements 4. The patient's functional ability including ADL's, fall risks, home safety risks and hearing or visual             impairment. 5. Diet and physical activities 6. Evidence for depression or mood disorders  The patients weight, height, BMI have been recorded in the chart and visual acuity is per eye clinic.  I have made referrals, counseling and provided education to the patient based review of the above and I have provided the pt with a written personalized care plan for preventive services.  Wt is down 5 lb with bmi of 35 ? 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  Routine anticipatory guidance given to patient.  See health maintenance. Flu- thinks she had it in the fall  PNA- 7/11  Zoster 6/09 vaccine  Tetanus shot 5/09 Colonoscopy- has not had and not interested in one - will do IFOB  Breast cancer screening 9/12 - thinks she has had one since then (ordered7/13)- unsure if she had it - she is apprehensive about getting one - due to tissue damage there - ? If compression could hurt the area  No breast lumps noted  Advance directive does have a living well  Cognitive function addressed- see scanned forms- and if abnormal then additional documentation follows. - no worries about her memory    PMH and SH reviewed  Meds, vitals, and allergies reviewed.   ROS: See HPI.  Otherwise negative.    bp is stable today  No cp or palpitations or headaches or edema  No side effects to medicines  BP Readings from Last 3 Encounters:  10/15/13 124/82  03/13/13 124/76  03/26/12 130/72     Melanoma f/u Has a PET scan and doctor visit next month at [place]  Her R arm feels a little tight - but no edema  Has most of her energy back    Hyperlipidemia Lab Results  Component Value Date   CHOL 128 10/08/2013   CHOL 126 05/20/2013   CHOL 121 03/13/2013   Lab Results  Component Value Date   HDL 48.70 10/08/2013   HDL 42.00 05/20/2013   HDL 45.60 03/13/2013   Lab Results  Component Value Date   LDLCALC 66 10/08/2013   LDLCALC 66 05/20/2013   LDLCALC 59 03/13/2013   Lab Results  Component Value Date   TRIG 66.0 10/08/2013   TRIG 89.0 05/20/2013   TRIG 83.0 03/13/2013   Lab Results  Component Value Date   CHOLHDL 3 10/08/2013   CHOLHDL 3 05/20/2013   CHOLHDL 3 03/13/2013   Lab Results  Component Value Date   LDLDIRECT 141.9 01/23/2007   zocor and diet -  good control   Hyperglycemia  Lab Results  Component Value Date   HGBA1C 6.1 10/08/2013   down from 6.3  Still pre diabetic  Watches sugar in things - except tea      Chemistry      Component Value Date/Time   NA 140 10/08/2013 1303   K 3.8 10/08/2013 1303   CL 100 10/08/2013 1303   CO2 31 10/08/2013 1303   BUN 23 10/08/2013 1303   CREATININE 0.9 10/08/2013 1303      Component Value Date/Time   CALCIUM 10.2 10/08/2013 1303   ALKPHOS 57 10/08/2013 1303   AST 20 10/08/2013 1303   ALT 17 10/08/2013 1303   BILITOT 1.0 10/08/2013 1303      Lab Results  Component Value Date   WBC 6.5 10/08/2013   HGB 12.9 10/08/2013   HCT 40.1 10/08/2013   MCV 97.1 10/08/2013   PLT 239.0 10/08/2013    Lab Results  Component Value Date   TSH 3.28 10/08/2013     Patient Active Problem List   Diagnosis Date Noted  . Encounter for Medicare annual wellness exam  10/15/2013  . Leg weakness, bilateral 03/13/2013  . Laryngitis 08/16/2011  . Other screening mammogram 03/30/2011  . GOUT, UNSPECIFIED 11/17/2010  . CHOLELITHIASIS, HX OF 11/17/2010  . HYPERCHOLESTEROLEMIA, PURE 05/17/2007  . HYPERGLYCEMIA 05/17/2007  . ALLERGIC  RHINITIS 01/23/2007  . HYPERTENSION 12/20/2006  . [place] 12/20/2006  . VERTIGO 12/20/2006  . URINARY INCONTINENCE, STRESS 12/20/2006   Past Medical History  Diagnosis Date  . Hypertension   . Hyperlipidemia   . Obesity   . Hyperglycemia   . Allergic rhinitis   . Mixed incontinence   . Plantar fasciitis     with significant disability  . Tendonitis of foot     L, chronic foot pain  . Melanoma    Past Surgical History  Procedure Laterality Date  . Abdominal hysterectomy      fibroids, ovaries intact  . Tendon repair  5/07    Tendon rupture L foot   History  Substance Use Topics  . Smoking status: Never Smoker   . Smokeless tobacco: Not on file  . Alcohol Use: No   Family History  Problem Relation Age of Onset  . Heart attack Mother   . Other Mother     ?thyroid problems  . Lung cancer Father    Allergies  Allergen Reactions  . Lipitor [Atorvastatin]     Muscle pain  . Naproxen Sodium    Current Outpatient Prescriptions on File Prior to Visit  Medication Sig Dispense Refill  . allopurinol (ZYLOPRIM) 300 MG tablet Take 1 tablet (300 mg total) by mouth daily.  90 tablet  3  . aspirin 81 MG tablet Take 81 mg by mouth daily.        Marland Kitchen CALCIUM-VITAMIN D PO Take 1 tablet by mouth daily.        . colchicine 0.6 MG tablet as needed. Take 2  tablets now with food and then 1 tablet twice a day with food      . fluticasone (FLONASE) 50 MCG/ACT nasal spray Place 2 sprays into the nose daily.  48 g  3  . guaiFENesin (MUCINEX) 600 MG 12 hr tablet Take 1,200 mg by mouth daily.      . hydrochlorothiazide (HYDRODIURIL) 25 MG tablet Take 1 tablet (25 mg total) by mouth daily.  90 tablet  3  . meloxicam  (MOBIC) 15 MG tablet Take 1  tablet (15 mg total) by mouth daily.  90 tablet  3  . Multiple Vitamin (MULTIVITAMIN) tablet Take 2 tablets by mouth daily.       Marland Kitchen oxybutynin (DITROPAN XL) 10 MG 24 hr tablet Take 1 tablet (10 mg total) by mouth daily.  90 tablet  3  . simvastatin (ZOCOR) 20 MG tablet Take 1 tablet (20 mg total) by mouth at bedtime.  90 tablet  3  . valsartan-hydrochlorothiazide (DIOVAN HCT) 160-12.5 MG per tablet Take 1 tablet by mouth daily.  90 tablet  3  . [DISCONTINUED] oxybutynin (DITROPAN XL) 10 MG 24 hr tablet Take 1 tablet (10 mg total) by mouth daily.  90 tablet  3   No current facility-administered medications on file prior to visit.     Review of Systems Review of Systems  Constitutional: Negative for fever, appetite change, fatigue and unexpected weight change.  Eyes: Negative for pain and visual disturbance.  Respiratory: Negative for cough and shortness of breath. Pos for pain in R chest wall under arm and around to back   Cardiovascular: Negative for  palpitations    Gastrointestinal: Negative for nausea, diarrhea and constipation.  Genitourinary: Negative for urgency and frequency.  Skin: Negative for pallor or rash   Neurological: Negative for weakness, light-headedness, numbness and headaches.  Hematological: Negative for adenopathy. Does not bruise/bleed easily.  Psychiatric/Behavioral: Negative for dysphoric mood. The patient is not nervous/anxious.         Objective:   Physical Exam  Constitutional: She appears well-developed and well-nourished. No distress.  obese and well appearing   HENT:  Head: Normocephalic and atraumatic.  Right Ear: External ear normal.  Left Ear: External ear normal.  Mouth/Throat: Oropharynx is clear and moist.  Eyes: Conjunctivae and EOM are normal. Pupils are equal, round, and reactive to light. No scleral icterus.  Neck: Normal range of motion. Neck supple. No JVD present. Carotid bruit is not present. No thyromegaly  present.  Cardiovascular: Normal rate, regular rhythm, normal heart sounds and intact distal pulses.  Exam reveals no gallop.   Pulmonary/Chest: Effort normal and breath sounds normal. No respiratory distress. She has no wheezes. She has no rales. She exhibits tenderness.  Tender over R lateral lower ribs No crepitus or skin change  Abdominal: Soft. Bowel sounds are normal. She exhibits no distension, no abdominal bruit and no mass. There is no tenderness.  Genitourinary: No breast swelling, tenderness, discharge or bleeding.  Breast exam: No mass, nodules, thickening, tenderness, bulging, retraction, inflamation, nipple discharge or skin changes noted.  No axillary or clavicular LA.    Musculoskeletal: Normal range of motion. She exhibits no edema and no tenderness.  Lymphadenopathy:    She has no cervical adenopathy.  Neurological: She is alert. She has normal reflexes. No cranial nerve deficit. She exhibits normal muscle tone. Coordination normal.  Skin: Skin is warm and dry. No rash noted. No erythema. No pallor.  Surgical side R axilla is well healed  Psychiatric: She has a normal mood and affect.          Assessment & Plan:

## 2013-10-15 NOTE — Patient Instructions (Signed)
Please send for last mammogram report [facility] - ? 2013  Ask your oncologist if it is ok to have a mammogram - and then you can schedule it yourself  Take care of yourself  Try to start some regular exercise Xray of right ribs and chest today for the pain you are having - on the way out

## 2013-10-16 NOTE — Assessment & Plan Note (Signed)
Likely muscular strain / or rel to prior surg In light of melanoma surgery- cxr and rib films today

## 2013-10-16 NOTE — Assessment & Plan Note (Signed)
Lab Results  Component Value Date   HGBA1C 6.1 10/08/2013    This is in fair control  Rev low glycemic diet

## 2013-10-16 NOTE — Assessment & Plan Note (Signed)
Disc goals for lipids and reasons to control them Rev labs with pt Rev low sat fat diet in detail  On simvastatin

## 2013-10-16 NOTE — Assessment & Plan Note (Signed)
BP: 124/82 mmHg  bp in fair control at this time  No changes needed Disc lifstyle change with low sodium diet and exercise   Labs reviewed

## 2013-10-16 NOTE — Assessment & Plan Note (Signed)
D/w patient Andrea Peters for colon cancer screening, including IFOB vs. colonoscopy.  Risks and benefits of both were discussed and patient voiced understanding.  Pt elects JFT:NBZX

## 2013-10-16 NOTE — Assessment & Plan Note (Signed)
Reviewed health habits including diet and exercise and skin cancer prevention Reviewed appropriate screening tests for age  Also reviewed health mt list, fam hx and immunization status , as well as social and family history   See HPI Labs reviewed  

## 2013-11-04 DIAGNOSIS — C439 Malignant melanoma of skin, unspecified: Secondary | ICD-10-CM | POA: Diagnosis not present

## 2013-11-06 DIAGNOSIS — C4360 Malignant melanoma of unspecified upper limb, including shoulder: Secondary | ICD-10-CM | POA: Diagnosis not present

## 2013-11-13 DIAGNOSIS — D129 Benign neoplasm of anus and anal canal: Secondary | ICD-10-CM | POA: Diagnosis not present

## 2013-11-13 DIAGNOSIS — M109 Gout, unspecified: Secondary | ICD-10-CM | POA: Diagnosis not present

## 2013-11-13 DIAGNOSIS — R933 Abnormal findings on diagnostic imaging of other parts of digestive tract: Secondary | ICD-10-CM | POA: Diagnosis not present

## 2013-11-13 DIAGNOSIS — Q438 Other specified congenital malformations of intestine: Secondary | ICD-10-CM | POA: Diagnosis not present

## 2013-11-13 DIAGNOSIS — D128 Benign neoplasm of rectum: Secondary | ICD-10-CM | POA: Diagnosis not present

## 2013-11-13 DIAGNOSIS — M129 Arthropathy, unspecified: Secondary | ICD-10-CM | POA: Diagnosis not present

## 2013-11-13 DIAGNOSIS — Z5309 Procedure and treatment not carried out because of other contraindication: Secondary | ICD-10-CM | POA: Diagnosis not present

## 2013-11-13 DIAGNOSIS — I10 Essential (primary) hypertension: Secondary | ICD-10-CM | POA: Diagnosis not present

## 2013-11-13 DIAGNOSIS — Z85820 Personal history of malignant melanoma of skin: Secondary | ICD-10-CM | POA: Diagnosis not present

## 2013-11-13 DIAGNOSIS — Z859 Personal history of malignant neoplasm, unspecified: Secondary | ICD-10-CM | POA: Diagnosis not present

## 2013-11-13 DIAGNOSIS — I890 Lymphedema, not elsewhere classified: Secondary | ICD-10-CM | POA: Diagnosis not present

## 2013-11-13 LAB — HM COLONOSCOPY

## 2013-11-21 ENCOUNTER — Encounter: Payer: Self-pay | Admitting: Family Medicine

## 2013-11-21 ENCOUNTER — Ambulatory Visit: Payer: Self-pay | Admitting: Family Medicine

## 2013-11-21 DIAGNOSIS — Z1231 Encounter for screening mammogram for malignant neoplasm of breast: Secondary | ICD-10-CM | POA: Diagnosis not present

## 2013-11-22 ENCOUNTER — Encounter: Payer: Self-pay | Admitting: *Deleted

## 2013-11-25 ENCOUNTER — Encounter: Payer: Self-pay | Admitting: *Deleted

## 2014-03-06 DIAGNOSIS — D239 Other benign neoplasm of skin, unspecified: Secondary | ICD-10-CM | POA: Diagnosis not present

## 2014-03-06 DIAGNOSIS — L570 Actinic keratosis: Secondary | ICD-10-CM | POA: Diagnosis not present

## 2014-03-06 DIAGNOSIS — L819 Disorder of pigmentation, unspecified: Secondary | ICD-10-CM | POA: Diagnosis not present

## 2014-03-06 DIAGNOSIS — Z85820 Personal history of malignant melanoma of skin: Secondary | ICD-10-CM | POA: Diagnosis not present

## 2014-04-05 ENCOUNTER — Other Ambulatory Visit: Payer: Self-pay | Admitting: Family Medicine

## 2014-04-07 NOTE — Telephone Encounter (Signed)
Received refill request electronically from pharmacy. Last refill 03/13/13 #90/3 refills, last office visit 10/15/13. Is it okay to refill medication?

## 2014-04-07 NOTE — Telephone Encounter (Signed)
Please refill for 6 mo 

## 2014-04-07 NOTE — Telephone Encounter (Signed)
Electronic refill request, pt has CPE appt scheduled on 10/15/14

## 2014-04-07 NOTE — Telephone Encounter (Signed)
Refill sent to pharmacy as instructed. 

## 2014-04-11 DIAGNOSIS — Z85820 Personal history of malignant melanoma of skin: Secondary | ICD-10-CM | POA: Diagnosis not present

## 2014-04-11 DIAGNOSIS — I10 Essential (primary) hypertension: Secondary | ICD-10-CM | POA: Diagnosis not present

## 2014-04-11 DIAGNOSIS — N9489 Other specified conditions associated with female genital organs and menstrual cycle: Secondary | ICD-10-CM | POA: Diagnosis not present

## 2014-04-11 DIAGNOSIS — R109 Unspecified abdominal pain: Secondary | ICD-10-CM | POA: Diagnosis not present

## 2014-04-14 ENCOUNTER — Other Ambulatory Visit: Payer: Self-pay | Admitting: Family Medicine

## 2014-04-30 ENCOUNTER — Other Ambulatory Visit: Payer: Self-pay | Admitting: Family Medicine

## 2014-05-13 ENCOUNTER — Other Ambulatory Visit: Payer: Self-pay | Admitting: Family Medicine

## 2014-05-13 ENCOUNTER — Telehealth: Payer: Self-pay | Admitting: Family Medicine

## 2014-05-13 MED ORDER — FLUTICASONE PROPIONATE 50 MCG/ACT NA SUSP: 2.0000 | Freq: Every day | NASAL | Status: DC

## 2014-05-13 MED ORDER — OXYBUTYNIN CHLORIDE ER 10 MG PO TB24: 10.0000 mg | ORAL_TABLET | Freq: Every day | ORAL | Status: DC

## 2014-05-13 NOTE — Telephone Encounter (Signed)
I will send it - she has been on it before

## 2014-05-13 NOTE — Telephone Encounter (Signed)
Pt is requesting RX for oxybutynin be sent to [facility].  Med is not on current med list, last written 09/2012.

## 2014-05-20 DIAGNOSIS — C4360 Malignant melanoma of unspecified upper limb, including shoulder: Secondary | ICD-10-CM | POA: Diagnosis not present

## 2014-06-03 DIAGNOSIS — C439 Malignant melanoma of skin, unspecified: Secondary | ICD-10-CM | POA: Diagnosis not present

## 2014-07-10 ENCOUNTER — Other Ambulatory Visit: Payer: Self-pay | Admitting: Family Medicine

## 2014-10-07 ENCOUNTER — Telehealth: Payer: Self-pay | Admitting: Family Medicine

## 2014-10-07 DIAGNOSIS — E7800 Pure hypercholesterolemia, unspecified: Secondary | ICD-10-CM

## 2014-10-07 DIAGNOSIS — I10 Essential (primary) hypertension: Secondary | ICD-10-CM

## 2014-10-07 DIAGNOSIS — R739 Hyperglycemia, unspecified: Secondary | ICD-10-CM

## 2014-10-07 DIAGNOSIS — M109 Gout, unspecified: Secondary | ICD-10-CM

## 2014-10-07 NOTE — Telephone Encounter (Signed)
-----   Message from Terri J Walsh sent at 10/02/2014  4:24 PM EST ----- Regarding: Lab orders for Wednesday, 2.3.16 Patient is scheduled for CPX labs, please order future labs, Thanks , Terri  

## 2014-10-08 ENCOUNTER — Other Ambulatory Visit (INDEPENDENT_AMBULATORY_CARE_PROVIDER_SITE_OTHER): Payer: Medicare Other

## 2014-10-08 DIAGNOSIS — I10 Essential (primary) hypertension: Secondary | ICD-10-CM | POA: Diagnosis not present

## 2014-10-08 DIAGNOSIS — M109 Gout, unspecified: Secondary | ICD-10-CM | POA: Diagnosis not present

## 2014-10-08 DIAGNOSIS — E7800 Pure hypercholesterolemia, unspecified: Secondary | ICD-10-CM

## 2014-10-08 DIAGNOSIS — R739 Hyperglycemia, unspecified: Secondary | ICD-10-CM

## 2014-10-08 LAB — COMPREHENSIVE METABOLIC PANEL
ALK PHOS: 56 U/L (ref 39–117)
ALT: 18 U/L (ref 0–35)
AST: 18 U/L (ref 0–37)
Albumin: 4.3 g/dL (ref 3.5–5.2)
BUN: 26 mg/dL — AB (ref 6–23)
CALCIUM: 10.4 mg/dL (ref 8.4–10.5)
CO2: 36 mEq/L — ABNORMAL HIGH (ref 19–32)
Chloride: 101 mEq/L (ref 96–112)
Creatinine, Ser: 1.09 mg/dL (ref 0.40–1.20)
GFR: 52.72 mL/min — AB (ref >60.00)
GLUCOSE: 109 mg/dL — AB (ref 70–99)
Potassium: 3.9 mEq/L (ref 3.5–5.1)
Sodium: 140 mEq/L (ref 135–145)
Total Bilirubin: 0.9 mg/dL (ref 0.2–1.2)
Total Protein: 6.7 g/dL (ref 6.0–8.3)

## 2014-10-08 LAB — LIPID PANEL
CHOLESTEROL: 136 mg/dL (ref 0–200)
HDL: 47.4 mg/dL (ref >39.00)
LDL Cholesterol: 70 mg/dL (ref 0–99)
NONHDL: 88.6
TRIGLYCERIDES: 91 mg/dL (ref 0.0–149.0)
Total CHOL/HDL Ratio: 3
VLDL: 18.2 mg/dL (ref 0.0–40.0)

## 2014-10-08 LAB — CBC WITH DIFFERENTIAL/PLATELET
Basophils Absolute: 0 10*3/uL (ref 0.0–0.1)
Basophils Relative: 0.7 % (ref 0.0–3.0)
EOS PCT: 5.6 % — AB (ref 0.0–5.0)
Eosinophils Absolute: 0.3 10*3/uL (ref 0.0–0.7)
HCT: 38.8 % (ref 36.0–46.0)
Hemoglobin: 13 g/dL (ref 12.0–15.0)
Lymphocytes Relative: 26.2 % (ref 12.0–46.0)
Lymphs Abs: 1.6 10*3/uL (ref 0.7–4.0)
MCHC: 33.5 g/dL (ref 30.0–36.0)
MCV: 93.2 fl (ref 78.0–100.0)
MONO ABS: 0.5 10*3/uL (ref 0.1–1.0)
Monocytes Relative: 8.4 % (ref 3.0–12.0)
Neutro Abs: 3.6 10*3/uL (ref 1.4–7.7)
Neutrophils Relative %: 59.1 % (ref 43.0–77.0)
PLATELETS: 241 10*3/uL (ref 150.0–400.0)
RBC: 4.17 Mil/uL (ref 3.87–5.11)
RDW: 14.2 % (ref 11.5–15.5)
WBC: 6.2 10*3/uL (ref 4.0–10.5)

## 2014-10-08 LAB — TSH: TSH: 3.99 u[IU]/mL (ref 0.35–4.50)

## 2014-10-08 LAB — URIC ACID: URIC ACID, SERUM: 5.7 mg/dL (ref 2.4–7.0)

## 2014-10-08 LAB — HEMOGLOBIN A1C: HEMOGLOBIN A1C: 6.3 % (ref 4.6–6.5)

## 2014-10-13 ENCOUNTER — Other Ambulatory Visit: Payer: Self-pay | Admitting: Family Medicine

## 2014-10-15 ENCOUNTER — Other Ambulatory Visit: Payer: Self-pay | Admitting: *Deleted

## 2014-10-15 ENCOUNTER — Ambulatory Visit (INDEPENDENT_AMBULATORY_CARE_PROVIDER_SITE_OTHER): Payer: Medicare Other | Admitting: Family Medicine

## 2014-10-15 ENCOUNTER — Encounter: Payer: Self-pay | Admitting: Family Medicine

## 2014-10-15 VITALS — BP 136/70 | HR 63 | Temp 98.2°F | Ht 62.0 in | Wt 201.8 lb

## 2014-10-15 DIAGNOSIS — I10 Essential (primary) hypertension: Secondary | ICD-10-CM

## 2014-10-15 DIAGNOSIS — Z0000 Encounter for general adult medical examination without abnormal findings: Secondary | ICD-10-CM

## 2014-10-15 DIAGNOSIS — E7800 Pure hypercholesterolemia, unspecified: Secondary | ICD-10-CM

## 2014-10-15 DIAGNOSIS — Z23 Encounter for immunization: Secondary | ICD-10-CM | POA: Diagnosis not present

## 2014-10-15 DIAGNOSIS — Z1211 Encounter for screening for malignant neoplasm of colon: Secondary | ICD-10-CM | POA: Diagnosis not present

## 2014-10-15 DIAGNOSIS — R739 Hyperglycemia, unspecified: Secondary | ICD-10-CM

## 2014-10-15 DIAGNOSIS — E2839 Other primary ovarian failure: Secondary | ICD-10-CM | POA: Diagnosis not present

## 2014-10-15 MED ORDER — ALLOPURINOL 300 MG PO TABS: 300.0000 mg | ORAL_TABLET | Freq: Every day | ORAL | Status: DC

## 2014-10-15 MED ORDER — SIMVASTATIN 20 MG PO TABS: ORAL_TABLET | ORAL | Status: DC

## 2014-10-15 MED ORDER — HYDROCHLOROTHIAZIDE 25 MG PO TABS: 25.0000 mg | ORAL_TABLET | Freq: Every day | ORAL | Status: DC

## 2014-10-15 NOTE — Assessment & Plan Note (Signed)
Ref for dexa /screening  No falls or fx  Is taking her ca and D

## 2014-10-15 NOTE — Progress Notes (Signed)
Subjective:    Patient ID: Andrea Peters, female    DOB: 1946/08/05, 69 y.o.   MRN: [id number]  HPI Here for annual medicare wellness visit and chronic/acute medical problems  I have personally reviewed the Medicare Annual Wellness questionnaire and have noted 1. The patient's medical and social history 2. Their use of alcohol, tobacco or illicit drugs 3. Their current medications and supplements 4. The patient's functional ability including ADL's, fall risks, home safety risks and hearing or visual             impairment. 5. Diet and physical activities 6. Evidence for depression or mood disorders  The patients weight, height, BMI have been recorded in the chart and visual acuity is per eye clinic.  I have made referrals, counseling and provided education to the patient based review of the above and I have provided the pt with a written personalized care plan for preventive services.  Is doing pretty well  Turned 68 this year - doing well   See scanned forms.  Routine anticipatory guidance given to patient.  See health maintenance. Colon cancer screening -last colonoscopy was in 2015 - "it was a disaster" - did at [place] (her cancer doctor wanted it done)- could not finish it  (told if she needs another one she will need to be in the hospital)- had a tortuous colon - unsure if recall ,no polyps  Breast cancer screening mammogram 3/15 nl - will schedule her own  Self breast exam no lumps Gyn - has had hyst-no problems  Flu vaccine- will get today  Tetanus vaccine Td 5/09 Pneumovax 7/11 , prevnar- wants today  Zoster vaccine 6/09  Advance directive - has one written up  Cognitive function addressed- see scanned forms- and if abnormal then additional documentation follows.  No concerns   PMH and SH reviewed  Meds, vitals, and allergies reviewed.   ROS: See HPI.  Otherwise negative.     Wt is up 9 lb with bmi of 36 Eating and exercise are not great  Knows she  needs to loose weight  She did change from diet soda to sweet tea  Not motivated to do it - active lifestyle    Hyperglycemia Lab Results  Component Value Date   HGBA1C 6.3 10/08/2014   Up from 6.1   Bone density screening  Last one was years ago  Due for one -will ref Takes ca and D  Cholesterol Lab Results  Component Value Date   CHOL 136 10/08/2014   CHOL 128 10/08/2013   CHOL 126 05/20/2013   Lab Results  Component Value Date   HDL 47.40 10/08/2014   HDL 48.70 10/08/2013   HDL 42.00 05/20/2013   Lab Results  Component Value Date   LDLCALC 70 10/08/2014   LDLCALC 66 10/08/2013   LDLCALC 66 05/20/2013   Lab Results  Component Value Date   TRIG 91.0 10/08/2014   TRIG 66.0 10/08/2013   TRIG 89.0 05/20/2013   Lab Results  Component Value Date   CHOLHDL 3 10/08/2014   CHOLHDL 3 10/08/2013   CHOLHDL 3 05/20/2013   Lab Results  Component Value Date   LDLDIRECT 141.9 01/23/2007   good profile   BUN is 26  She is drinking tea- will change to water   Is on allopurinol for gout  Uric acid is well controled   Patient Active Problem List   Diagnosis Date Noted  . Encounter for Medicare annual wellness exam 10/15/2013  .  Colon cancer screening 10/15/2013  . Right-sided chest wall pain 10/15/2013  . Leg weakness, bilateral 03/13/2013  . Laryngitis 08/16/2011  . Other screening mammogram 03/30/2011  . Gout 11/17/2010  . CHOLELITHIASIS, HX OF 11/17/2010  . HYPERCHOLESTEROLEMIA, PURE 05/17/2007  . Hyperglycemia 05/17/2007  . ALLERGIC  RHINITIS 01/23/2007  . Essential hypertension 12/20/2006  . [place], [place] 12/20/2006  . URINARY INCONTINENCE, STRESS 12/20/2006   Past Medical History  Diagnosis Date  . Hypertension   . Hyperlipidemia   . Obesity   . Hyperglycemia   . Allergic rhinitis   . Mixed incontinence   . Plantar fasciitis     with significant disability  . Tendonitis of foot     L, chronic foot pain  . Melanoma    Past Surgical  History  Procedure Laterality Date  . Abdominal hysterectomy      fibroids, ovaries intact  . Tendon repair  5/07    Tendon rupture L foot   History  Substance Use Topics  . Smoking status: Never Smoker   . Smokeless tobacco: Not on file  . Alcohol Use: No   Family History  Problem Relation Age of Onset  . Heart attack Mother   . Other Mother     ?thyroid problems  . Lung cancer Father    Allergies  Allergen Reactions  . Lipitor [Atorvastatin]     Muscle pain  . Naproxen Sodium    Current Outpatient Prescriptions on File Prior to Visit  Medication Sig Dispense Refill  . allopurinol (ZYLOPRIM) 300 MG tablet TAKE 1 TABLET BY MOUTH DAILY. 90 tablet 1  . aspirin 81 MG tablet Take 81 mg by mouth daily.      Marland Kitchen CALCIUM-VITAMIN D PO Take 1 tablet by mouth daily.      . fluticasone (FLONASE) 50 MCG/ACT nasal spray Place 2 sprays into both nostrils daily. 48 g 1  . guaiFENesin (MUCINEX) 600 MG 12 hr tablet Take 1,200 mg by mouth daily.    . hydrochlorothiazide (HYDRODIURIL) 25 MG tablet TAKE 1 TABLET BY MOUTH DAILY. 90 tablet 1  . meloxicam (MOBIC) 15 MG tablet TAKE 1 TABLET BY MOUTH DAILY. 90 tablet 1  . Multiple Vitamin (MULTIVITAMIN) tablet Take 2 tablets by mouth daily.     Marland Kitchen oxybutynin (DITROPAN-XL) 10 MG 24 hr tablet Take 1 tablet (10 mg total) by mouth at bedtime. 90 tablet 3  . simvastatin (ZOCOR) 20 MG tablet TAKE 1 TABLET (20 MG TOTAL) BY MOUTH AT BEDTIME. 90 tablet 1  . valsartan-hydrochlorothiazide (DIOVAN-HCT) 160-12.5 MG per tablet TAKE 1 TABLET DAILY 90 tablet 1  . colchicine 0.6 MG tablet as needed. Take 2  tablets now with food and then 1 tablet twice a day with food     No current facility-administered medications on file prior to visit.       Review of Systems    Review of Systems  Constitutional: Negative for fever, appetite change, fatigue and unexpected weight change.  Eyes: Negative for pain and visual disturbance.  Respiratory: Negative for cough and  shortness of breath.   Cardiovascular: Negative for cp or palpitations    Gastrointestinal: Negative for nausea, diarrhea and constipation.  Genitourinary: Negative for urgency and frequency.  Skin: Negative for pallor or rash   Neurological: Negative for weakness, light-headedness, numbness and headaches.  Hematological: Negative for adenopathy. Does not bruise/bleed easily.  Psychiatric/Behavioral: Negative for dysphoric mood. The patient is not nervous/anxious.      Objective:   Physical Exam  Constitutional: She appears well-developed and well-nourished. No distress.  obese and well appearing   HENT:  Head: Normocephalic and atraumatic.  Right Ear: External ear normal.  Left Ear: External ear normal.  Mouth/Throat: Oropharynx is clear and moist.  Eyes: Conjunctivae and EOM are normal. Pupils are equal, round, and reactive to light. No scleral icterus.  Neck: Normal range of motion. Neck supple. No JVD present. Carotid bruit is not present. No thyromegaly present.  Cardiovascular: Normal rate, regular rhythm, normal heart sounds and intact distal pulses.  Exam reveals no gallop.   Pulmonary/Chest: Effort normal and breath sounds normal. No respiratory distress. She has no wheezes. She exhibits no tenderness.  Abdominal: Soft. Bowel sounds are normal. She exhibits no distension, no abdominal bruit and no mass. There is no tenderness.  Genitourinary: No breast swelling, tenderness, discharge or bleeding.  Breast exam: No mass, nodules, thickening, tenderness, bulging, retraction, inflamation, nipple discharge or skin changes noted.  No axillary or clavicular LA.      Musculoskeletal: Normal range of motion. She exhibits no edema or tenderness.  No kyphosis   Lymphadenopathy:    She has no cervical adenopathy.  Neurological: She is alert. She has normal reflexes. No cranial nerve deficit. She exhibits normal muscle tone. Coordination normal.  Skin: Skin is warm and dry. No rash noted.  No erythema. No pallor.  Psychiatric: She has a normal mood and affect.          Assessment & Plan:   Problem List Items Addressed This Visit      Cardiovascular and Mediastinum   Essential hypertension    bp in fair control at this time  BP Readings from Last 1 Encounters:  10/15/14 136/70   No changes needed Disc lifstyle change with low sodium diet and exercise  Labs reviewed  Enc wt loss         Other   Colon cancer screening    Per pt -had colonosc in 2015 ad adv of her oncol She states colon was tortuous and could not finish Was at [facility] if recall      Encounter for Medicare annual wellness exam - Primary    Reviewed health habits including diet and exercise and skin cancer prevention Reviewed appropriate screening tests for age  Also reviewed health mt list, fam hx and immunization status , as well as social and family history   See HPI She will schedule her own mammogram  Flu and prevnar shots today   Ref for dexa  Enc wt loss         Estrogen deficiency    Ref for dexa /screening  No falls or fx  Is taking her ca and D      Relevant Orders   DG Bone Density   HYPERCHOLESTEROLEMIA, PURE    Well controlled with statin  Diet is fair  Disc goals for lipids and reasons to control them Rev labs with pt Rev low sat fat diet in detail       Hyperglycemia    A1C is up with wt gain and poor eating and sweet tea  Rev low glycemic diet Plan for wt loss  F/u 6 mo        Other Visit Diagnoses    Flu vaccine need        Relevant Orders    Flu Vaccine QUAD 36+ mos IM (Completed)    Need for vaccination with 13-polyvalent pneumococcal conjugate vaccine  Relevant Orders    Pneumococcal conjugate vaccine 13-valent IM (Completed)

## 2014-10-15 NOTE — Assessment & Plan Note (Signed)
bp in fair control at this time  BP Readings from Last 1 Encounters:  10/15/14 136/70   No changes needed Disc lifstyle change with low sodium diet and exercise  Labs reviewed  Enc wt loss

## 2014-10-15 NOTE — Progress Notes (Signed)
Pre visit review using our clinic review tool, if applicable. No additional management support is needed unless otherwise documented below in the visit note. 

## 2014-10-15 NOTE — Patient Instructions (Signed)
Don't forget to schedule your mammogram  Flu shot and prevnar shot today  Stop at check out for referral for bone density   Work on healthy diet and exercise for weight loss Follow up in 6 months with labs prior for blood sugar

## 2014-10-15 NOTE — Assessment & Plan Note (Signed)
Well controlled with statin  Diet is fair  Disc goals for lipids and reasons to control them Rev labs with pt Rev low sat fat diet in detail

## 2014-10-15 NOTE — Assessment & Plan Note (Signed)
A1C is up with wt gain and poor eating and sweet tea  Rev low glycemic diet Plan for wt loss  F/u 6 mo

## 2014-10-15 NOTE — Assessment & Plan Note (Signed)
Per pt -had colonosc in 2015 ad adv of her oncol She states colon was tortuous and could not finish Was at [facility] if recall

## 2014-10-15 NOTE — Telephone Encounter (Signed)
Received faxed request for medication refill from [facility]. Rx refill and sent to [facility].

## 2014-10-15 NOTE — Telephone Encounter (Signed)
Received fax request for medication [facility]ill from [facility]. rx [facility]

## 2014-10-15 NOTE — Telephone Encounter (Signed)
received fax request for medication [facility]ill from [facility]. [facility] to [facility].

## 2014-10-15 NOTE — Assessment & Plan Note (Signed)
Reviewed health habits including diet and exercise and skin cancer prevention Reviewed appropriate screening tests for age  Also reviewed health mt list, fam hx and immunization status , as well as social and family history   See HPI She will schedule her own mammogram  Flu and prevnar shots today   Ref for dexa  Enc wt loss

## 2014-10-16 ENCOUNTER — Other Ambulatory Visit: Payer: Self-pay | Admitting: Family Medicine

## 2014-10-17 ENCOUNTER — Encounter: Payer: Medicare Other | Admitting: Family Medicine

## 2014-10-17 ENCOUNTER — Telehealth: Payer: Self-pay | Admitting: Family Medicine

## 2014-10-17 NOTE — Telephone Encounter (Signed)
emmi emailed °

## 2014-10-18 ENCOUNTER — Other Ambulatory Visit: Payer: Self-pay | Admitting: Family Medicine

## 2014-12-03 DIAGNOSIS — C4361 Malignant melanoma of right upper limb, including shoulder: Secondary | ICD-10-CM | POA: Diagnosis not present

## 2014-12-03 DIAGNOSIS — Z6837 Body mass index (BMI) 37.0-37.9, adult: Secondary | ICD-10-CM | POA: Diagnosis not present

## 2014-12-15 ENCOUNTER — Encounter: Payer: Self-pay | Admitting: Family Medicine

## 2014-12-15 ENCOUNTER — Ambulatory Visit
Admit: 2014-12-15 | Disposition: A | Discharge status: Home / Self Care | Payer: Self-pay | Attending: Family Medicine | Admitting: Family Medicine

## 2014-12-15 DIAGNOSIS — M8589 Other specified disorders of bone density and structure, multiple sites: Secondary | ICD-10-CM | POA: Diagnosis not present

## 2014-12-15 DIAGNOSIS — M8580 Other specified disorders of bone density and structure, unspecified site: Secondary | ICD-10-CM | POA: Diagnosis not present

## 2014-12-15 DIAGNOSIS — Z1231 Encounter for screening mammogram for malignant neoplasm of breast: Secondary | ICD-10-CM | POA: Diagnosis not present

## 2014-12-16 ENCOUNTER — Telehealth: Payer: Self-pay

## 2014-12-16 NOTE — Telephone Encounter (Signed)
Tried to call patient to inform her of normal mammogram results but unable to leave message.

## 2014-12-16 NOTE — Telephone Encounter (Signed)
-----   Message from Abner Greenspan, MD sent at 12/15/2014  9:16 PM EDT ----- Mammogram is normal  Please note for flow sheet if you can  Due for next screening mammogram in 1 year

## 2014-12-19 ENCOUNTER — Telehealth: Payer: Self-pay

## 2014-12-19 NOTE — Telephone Encounter (Signed)
-----   Message from Abner Greenspan, MD sent at 12/18/2014  4:28 PM EDT ----- dexa shows osteopenia (borderline osteoporosis) Please take your calcium and vitamin D If you want to discuss medication options to prevent fx please f/u

## 2014-12-19 NOTE — Telephone Encounter (Signed)
Patient aware of dexa scan results and recommendations.

## 2015-01-14 ENCOUNTER — Other Ambulatory Visit: Payer: Self-pay | Admitting: Family Medicine

## 2015-01-15 NOTE — Telephone Encounter (Signed)
Electronic refill request, last refilled on 04/07/14 #90 with 0 refill, pt has a f/u appt scheduled on 04/08/15, please advise

## 2015-01-15 NOTE — Telephone Encounter (Signed)
Please refill times 1

## 2015-01-15 NOTE — Telephone Encounter (Signed)
done

## 2015-01-27 IMAGING — CR DG CHEST 2V
2 series · 2 of 2 positions shown · non-contrast
Comparison: 05/03/2010

CLINICAL DATA: Right chest wall pain, history melanoma

EXAM:
CHEST  2 VIEW

[view not recorded (1 of 2)]
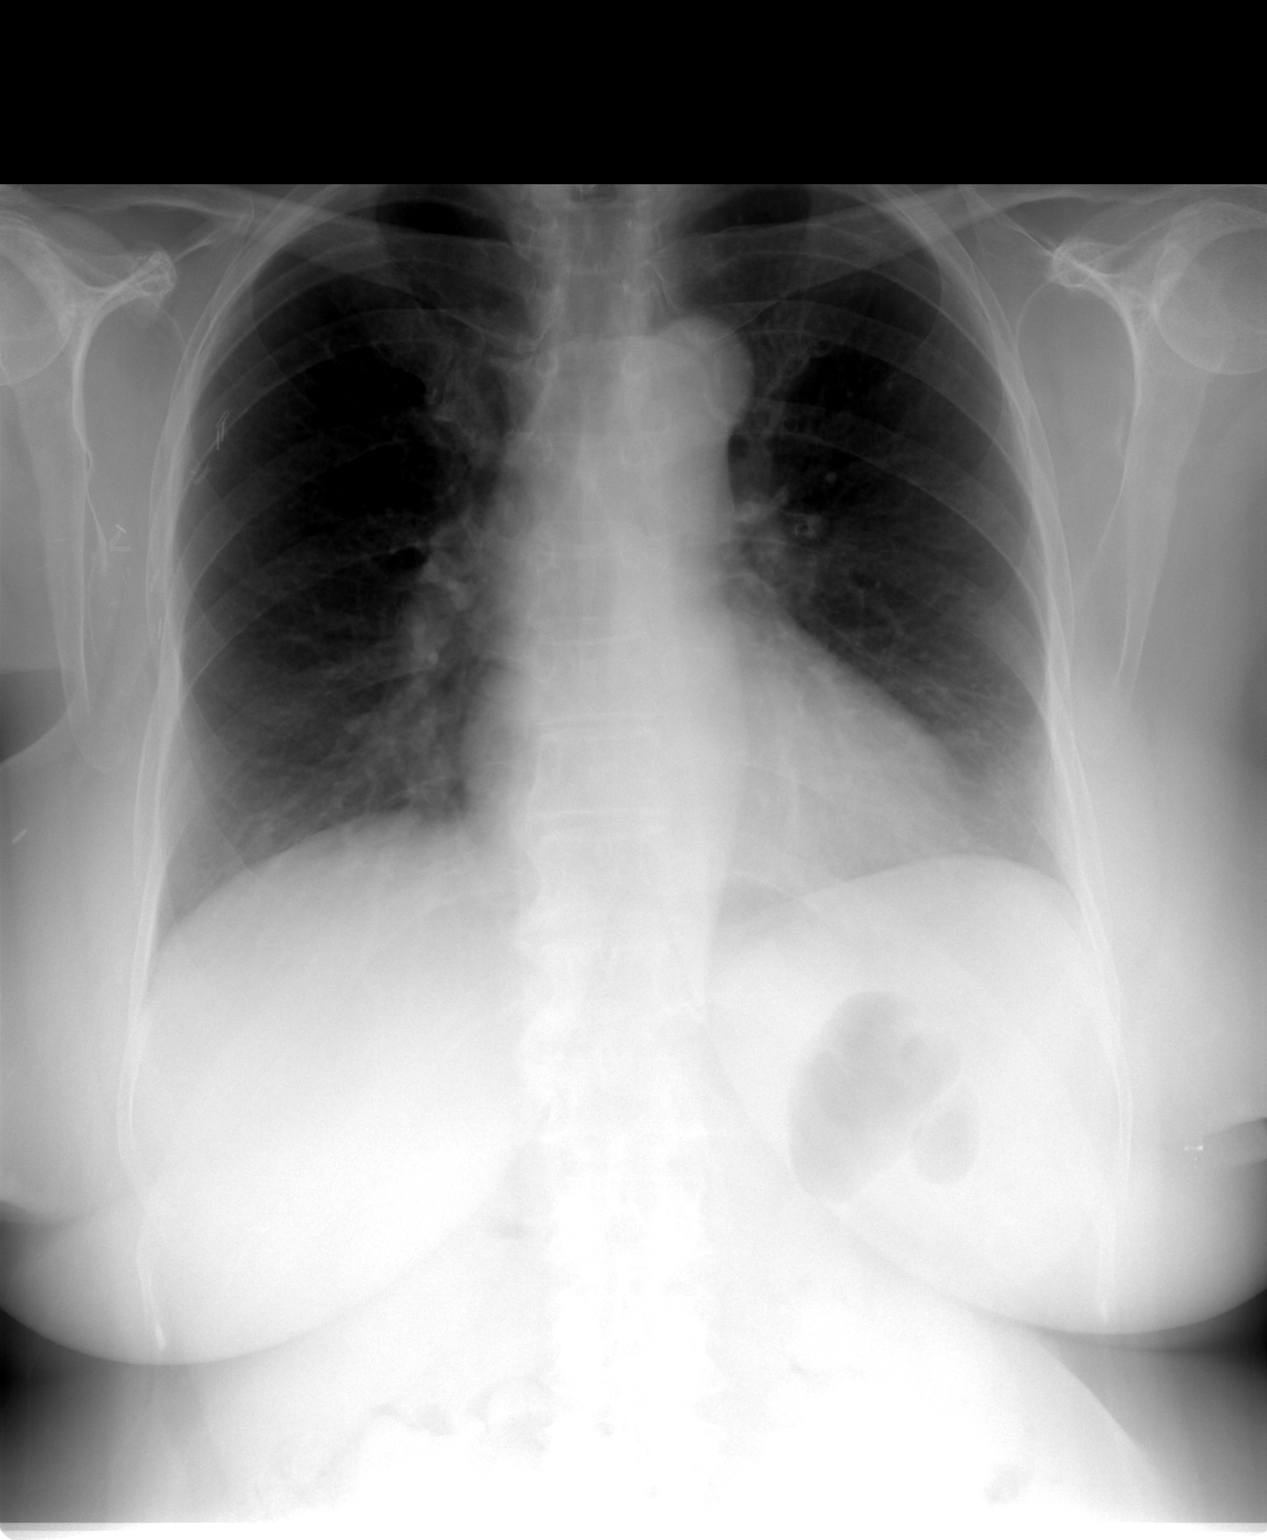

[view not recorded (2 of 2)]
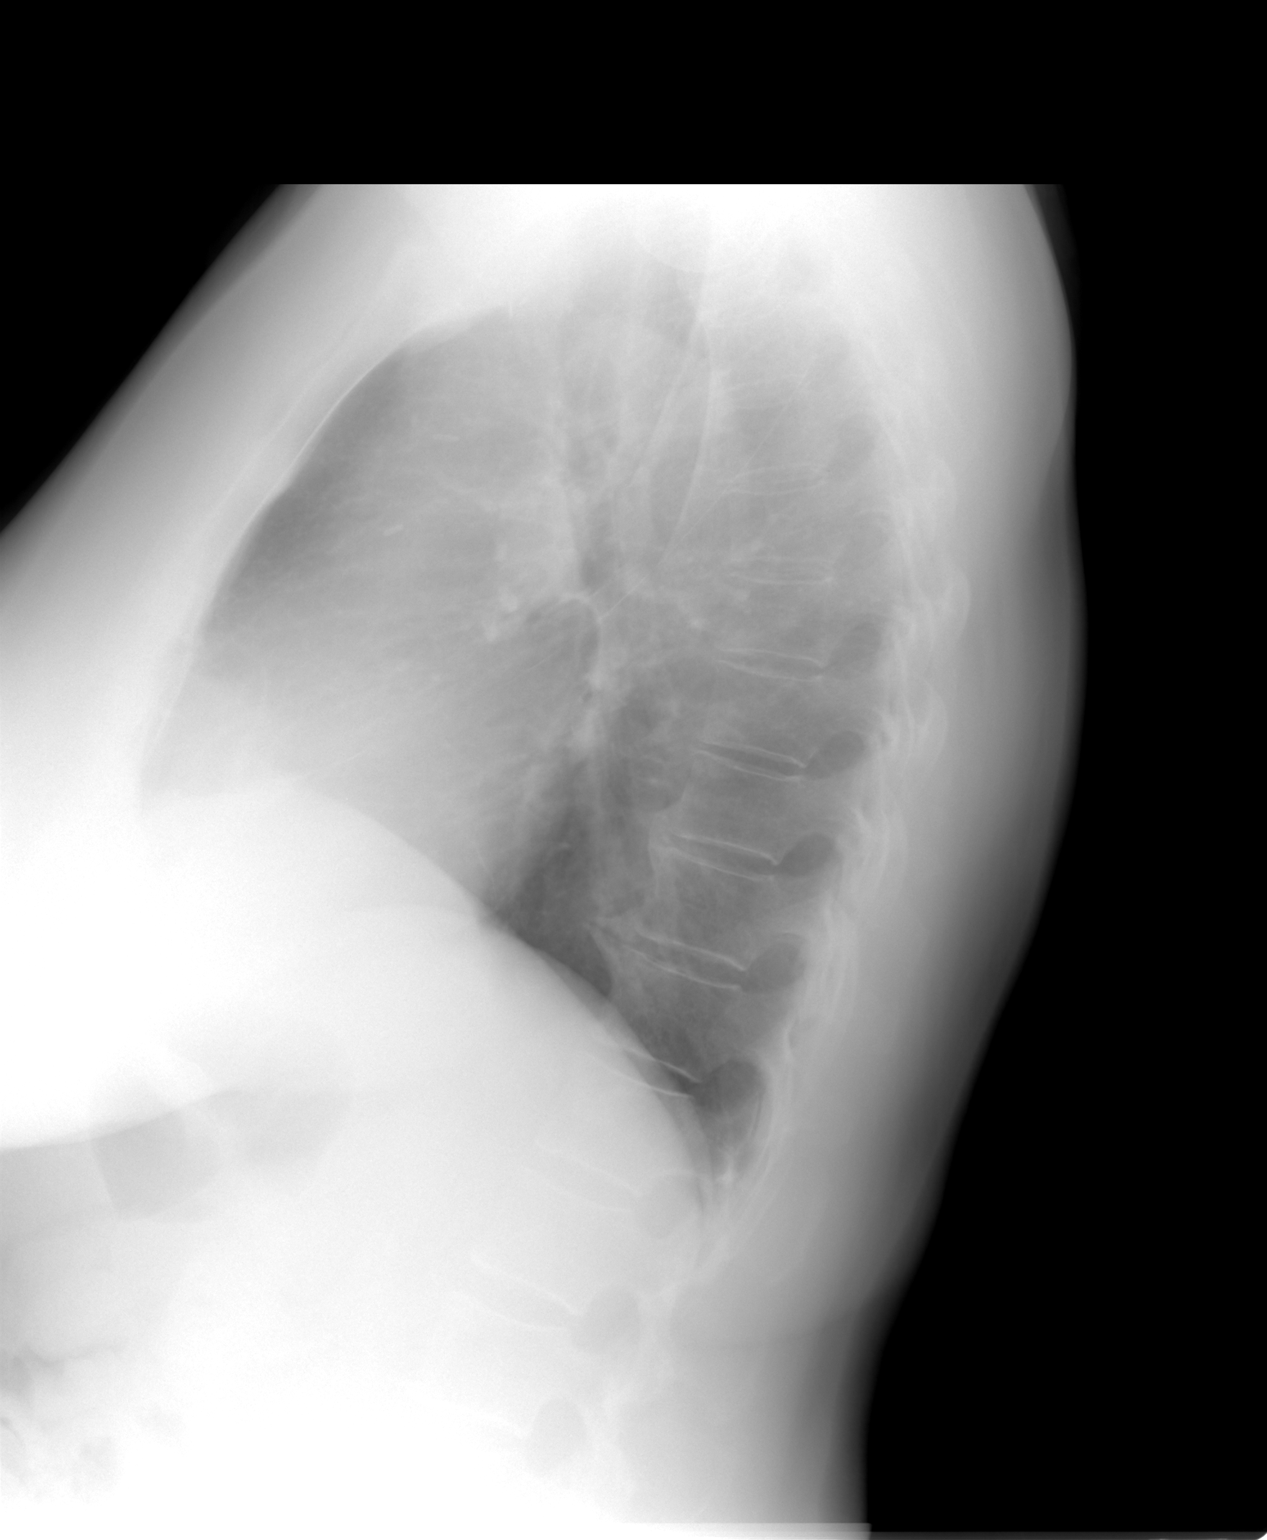

[2 of 2 positions shown; findings below may reference images not displayed]

FINDINGS: Enlargement of cardiac silhouette.

Mediastinal contours and pulmonary vascularity normal.

Lungs clear.

No pleural effusion or pneumothorax.

Surgical clips right axilla.

No acute osseous abnormalities.
IMPRESSION: Mild enlargement of cardiac silhouette.

No acute abnormalities.

## 2015-01-27 IMAGING — CR DG RIBS 2V*R*
4 series · 4 of 4 positions shown · non-contrast
Comparison: None.

CLINICAL DATA: Right-sided chest pain

EXAM:
RIGHT RIBS - 2 VIEW

[view not recorded (1 of 4)]
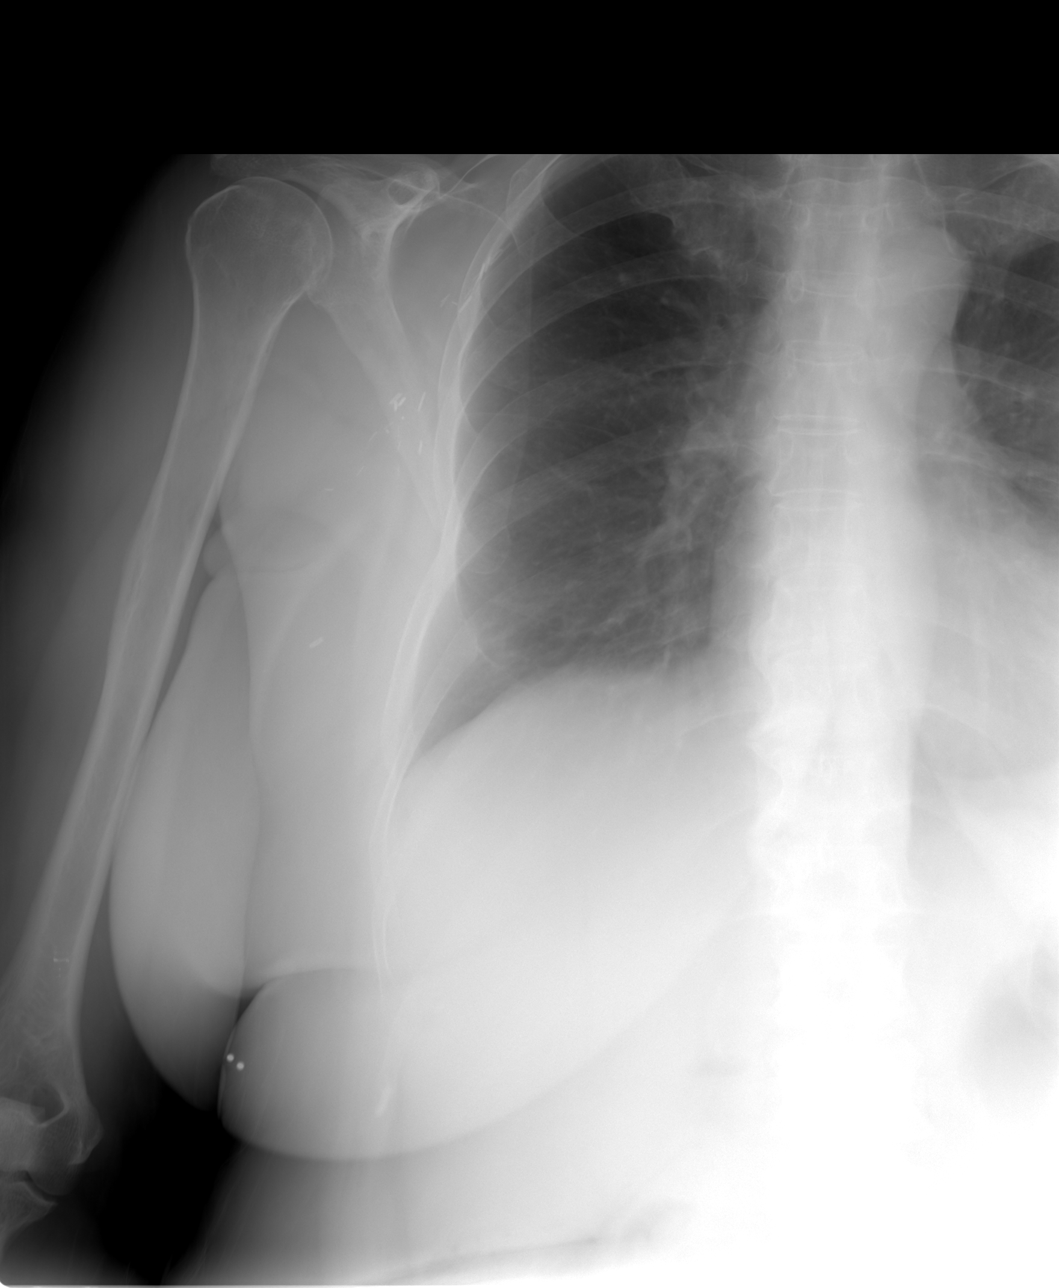

[view not recorded (2 of 4)]
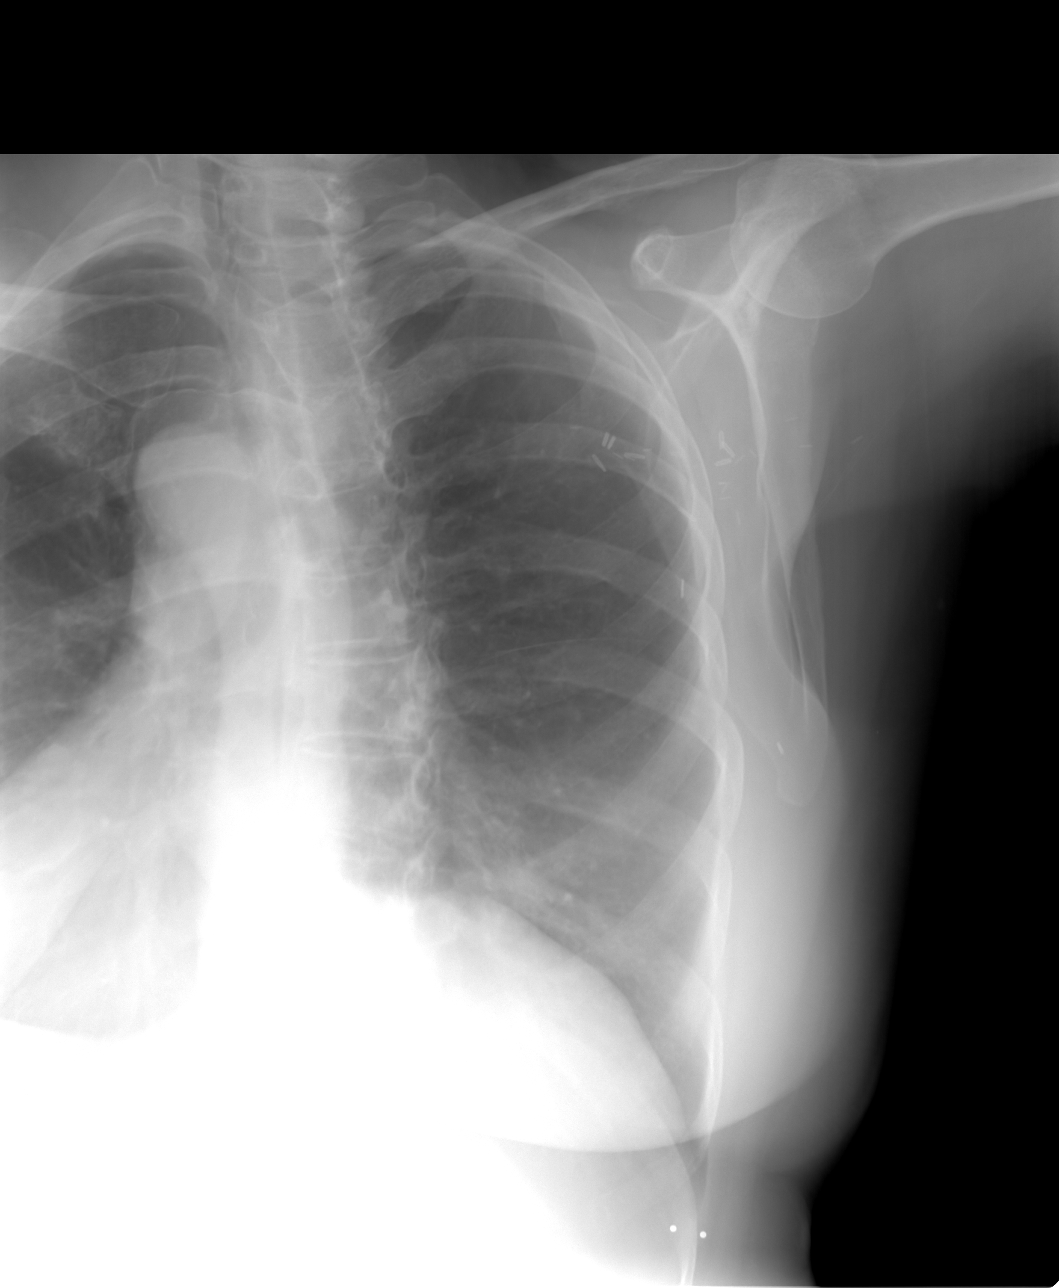

[view not recorded (3 of 4)]
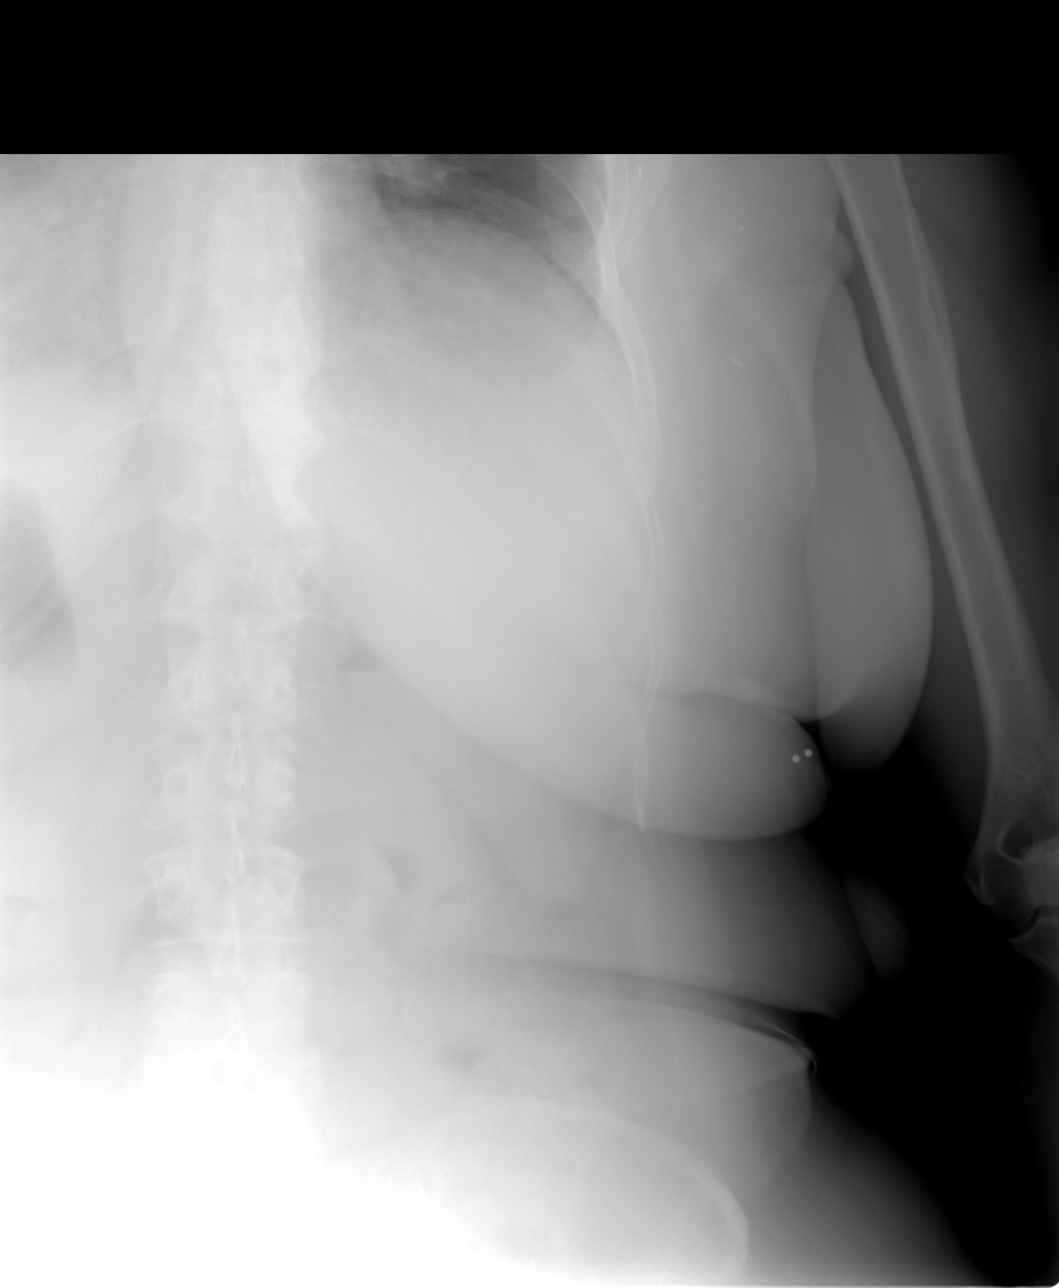

[view not recorded (4 of 4)]
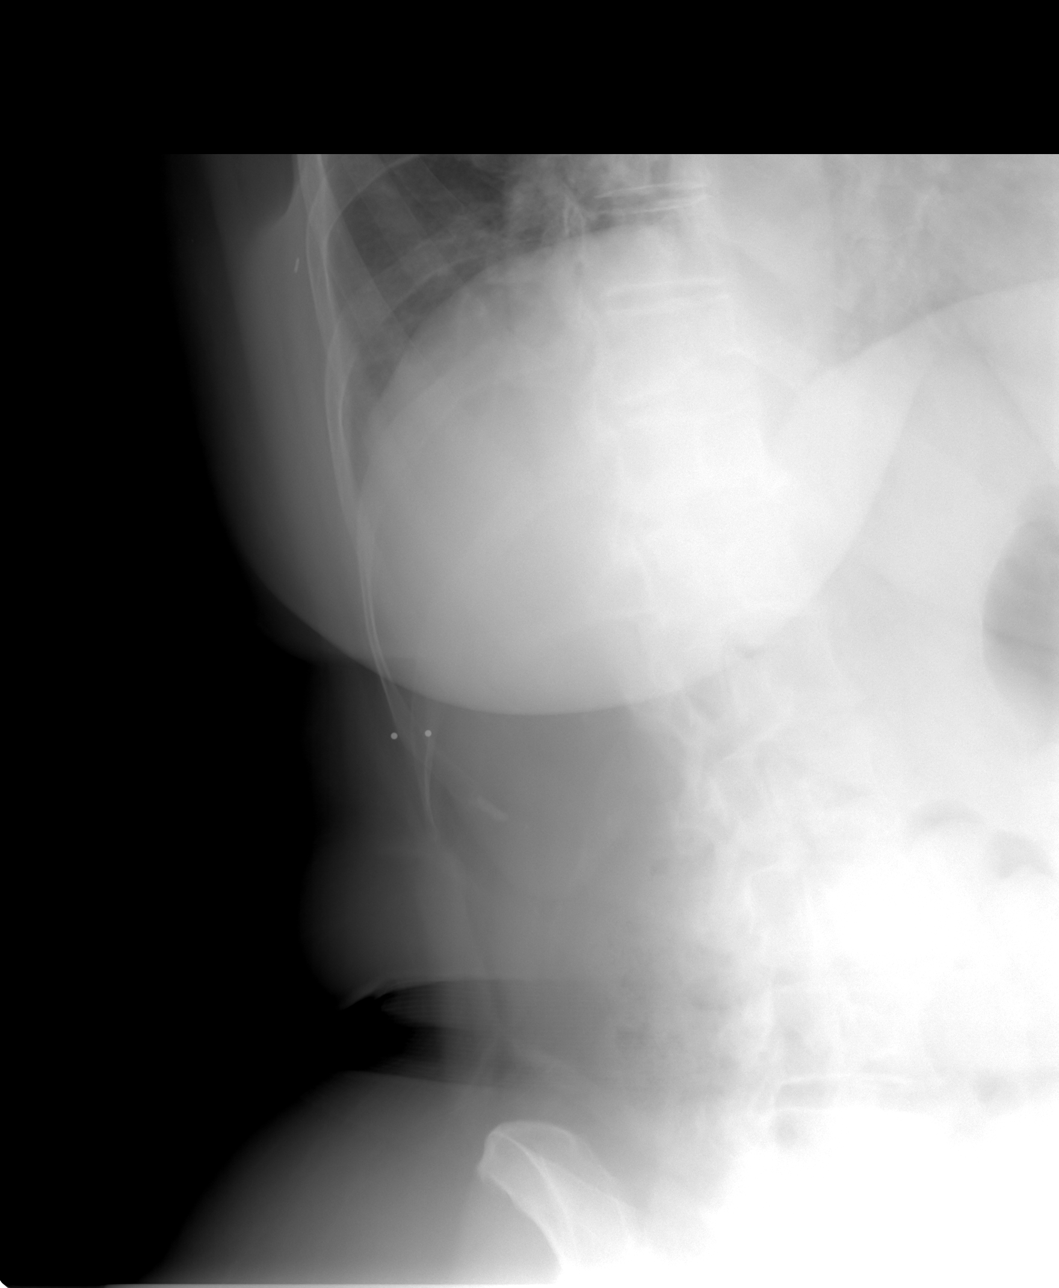

[4 of 4 positions shown; findings below may reference images not displayed]

FINDINGS: Postsurgical changes are seen. The right lung is clear. No
definitive bony abnormality is noted.
IMPRESSION: No acute rib abnormality seen.

## 2015-03-20 ENCOUNTER — Ambulatory Visit (INDEPENDENT_AMBULATORY_CARE_PROVIDER_SITE_OTHER)
Admission: RE | Admit: 2015-03-20 | Discharge: 2015-03-20 | Disposition: A | Discharge status: Home / Self Care | Payer: Medicare Other | Source: Ambulatory Visit | Attending: Family Medicine | Admitting: Family Medicine

## 2015-03-20 ENCOUNTER — Encounter: Payer: Self-pay | Admitting: Family Medicine

## 2015-03-20 ENCOUNTER — Telehealth: Payer: Self-pay | Admitting: Family Medicine

## 2015-03-20 ENCOUNTER — Ambulatory Visit (INDEPENDENT_AMBULATORY_CARE_PROVIDER_SITE_OTHER): Payer: Medicare Other | Admitting: Family Medicine

## 2015-03-20 VITALS — BP 128/60 | HR 62 | Temp 97.8°F | Wt 200.0 lb

## 2015-03-20 DIAGNOSIS — J209 Acute bronchitis, unspecified: Secondary | ICD-10-CM

## 2015-03-20 DIAGNOSIS — R05 Cough: Secondary | ICD-10-CM | POA: Diagnosis not present

## 2015-03-20 MED ORDER — BENZONATATE 200 MG PO CAPS: 200.0000 mg | ORAL_CAPSULE | Freq: Three times a day (TID) | ORAL | Status: DC | PRN

## 2015-03-20 MED ORDER — AZITHROMYCIN 250 MG PO TABS: ORAL_TABLET | ORAL | Status: DC

## 2015-03-20 NOTE — Telephone Encounter (Signed)
I will see her then  

## 2015-03-20 NOTE — Telephone Encounter (Signed)
Pt has appt on 03/20/15 at 2:15 with Dr Glori Bickers.

## 2015-03-20 NOTE — Progress Notes (Signed)
Pre visit review using our clinic review tool, if applicable. No additional management support is needed unless otherwise documented below in the visit note. 

## 2015-03-20 NOTE — Progress Notes (Signed)
Subjective:    Patient ID: Andrea Peters, female    DOB: [birth year], 69 y.o.   MRN: [id number]  HPI Here with fatigue and cough   Really tired for the last month or so  Dry cough- has become worse and worse   She had this years and years ago  Was tx with tessalon and that got rid of it   Non prod cough  Last night she felt very hot and cold - did not take her temp  She did not get the sweats but had body aches   No upper resp symptoms  No sore throat - little hoarse from coughing   No GI symptoms  No heartburn  No post nasal drip   Patient Active Problem List   Diagnosis Date Noted  . Acute bronchitis 03/20/2015  . Estrogen deficiency 10/15/2014  . Encounter for Medicare annual wellness exam 10/15/2013  . Colon cancer screening 10/15/2013  . Right-sided chest wall pain 10/15/2013  . Leg weakness, bilateral 03/13/2013  . Laryngitis 08/16/2011  . Other screening mammogram 03/30/2011  . Gout 11/17/2010  . CHOLELITHIASIS, HX OF 11/17/2010  . HYPERCHOLESTEROLEMIA, PURE 05/17/2007  . Hyperglycemia 05/17/2007  . ALLERGIC  RHINITIS 01/23/2007  . Essential hypertension 12/20/2006  . [place], [place] 12/20/2006  . URINARY INCONTINENCE, STRESS 12/20/2006   Past Medical History  Diagnosis Date  . Hypertension   . Hyperlipidemia   . Obesity   . Hyperglycemia   . Allergic rhinitis   . Mixed incontinence   . Plantar fasciitis     with significant disability  . Tendonitis of foot     L, chronic foot pain  . Melanoma    Past Surgical History  Procedure Laterality Date  . Abdominal hysterectomy      fibroids, ovaries intact  . Tendon repair  5/07    Tendon rupture L foot   History  Substance Use Topics  . Smoking status: Never Smoker   . Smokeless tobacco: Not on file  . Alcohol Use: No   Family History  Problem Relation Age of Onset  . Heart attack Mother   . Other Mother     ?thyroid problems  . Lung cancer Father    Allergies  Allergen  Reactions  . Lipitor [Atorvastatin]     Muscle pain  . Naproxen Sodium    Current Outpatient Prescriptions on File Prior to Visit  Medication Sig Dispense Refill  . allopurinol (ZYLOPRIM) 300 MG tablet Take 1 tablet (300 mg total) by mouth daily. 90 tablet 1  . allopurinol (ZYLOPRIM) 300 MG tablet TAKE 1 TABLET BY MOUTH EVERY DAY 90 tablet 1  . aspirin 81 MG tablet Take 81 mg by mouth daily.      Marland Kitchen CALCIUM-VITAMIN D PO Take 1 tablet by mouth daily.      . fluticasone (FLONASE) 50 MCG/ACT nasal spray PLACE 2 SPRAYS INTO BOTH NOSTRILS DAILY. 48 g 0  . guaiFENesin (MUCINEX) 600 MG 12 hr tablet Take 1,200 mg by mouth daily.    . hydrochlorothiazide (HYDRODIURIL) 25 MG tablet Take 1 tablet (25 mg total) by mouth daily. 90 tablet 1  . hydrochlorothiazide (HYDRODIURIL) 25 MG tablet TAKE 1 TABLET BY MOUTH EVERY DAY 90 tablet 1  . meloxicam (MOBIC) 15 MG tablet TAKE 1 TABLET BY MOUTH DAILY. 90 tablet 0  . Multiple Vitamin (MULTIVITAMIN) tablet Take 2 tablets by mouth daily.     Marland Kitchen oxybutynin (DITROPAN-XL) 10 MG 24 hr tablet Take 1 tablet (10 mg  total) by mouth at bedtime. 90 tablet 3  . simvastatin (ZOCOR) 20 MG tablet TAKE 1 TABLET (20 MG TOTAL) BY MOUTH AT BEDTIME. 90 tablet 1  . simvastatin (ZOCOR) 20 MG tablet TAKE 1 TABLET (20 MG TOTAL) BY MOUTH AT BEDTIME. 90 tablet 1  . valsartan-hydrochlorothiazide (DIOVAN-HCT) 160-12.5 MG per tablet TAKE 1 TABLET DAILY 90 tablet 1  . colchicine 0.6 MG tablet as needed. Take 2  tablets now with food and then 1 tablet twice a day with food     No current facility-administered medications on file prior to visit.    Review of Systems Review of Systems  Constitutional: Negative for , appetite change,  and unexpected weight change.  ENT neg for cong or ST Eyes: Negative for pain and visual disturbance.  Respiratory: Negative for wheeze and shortness of breath.   Cardiovascular: Negative for cp or palpitations    Gastrointestinal: Negative for nausea,  diarrhea and constipation.  Genitourinary: Negative for urgency and frequency.  Skin: Negative for pallor or rash   Neurological: Negative for weakness, light-headedness, numbness and headaches.  Hematological: Negative for adenopathy. Does not bruise/bleed easily.  Psychiatric/Behavioral: Negative for dysphoric mood. The patient is not nervous/anxious.         Objective:   Physical Exam  Constitutional: She appears well-developed and well-nourished. No distress.  obese and well appearing   HENT:  Head: Normocephalic and atraumatic.  Right Ear: External ear normal.  Left Ear: External ear normal.  Mouth/Throat: Oropharynx is clear and moist.  Nares are boggy No sinus tenderness Clear rhinorrhea and post nasal drip   Eyes: Conjunctivae and EOM are normal. Pupils are equal, round, and reactive to light. Right eye exhibits no discharge. Left eye exhibits no discharge.  Neck: Normal range of motion. Neck supple.  Cardiovascular: Normal rate and normal heart sounds.   Pulmonary/Chest: Effort normal. No respiratory distress. She has no wheezes. She has rales. She exhibits no tenderness.  Few rales at R base on deep inspiration No rhonchi or wheeze Harsh cough Good air exch  Lymphadenopathy:    She has no cervical adenopathy.  Neurological: She is alert.  Skin: Skin is warm and dry. No rash noted.  Psychiatric: She has a normal mood and affect.          Assessment & Plan:   Problem List Items Addressed This Visit    Acute bronchitis - Primary    More congested on R side on exam  Prolonged symptoms - CXR now  Cover with zpak Tessalon for cough Update       Relevant Orders   DG Chest 2 View (Completed)

## 2015-03-20 NOTE — Telephone Encounter (Signed)
PLEASE NOTE: All timestamps contained within this report are represented as [place] Standard Time. CONFIDENTIALTY NOTICE: This fax transmission is intended only for the addressee. It contains information that is legally privileged, confidential or otherwise protected from use or disclosure. If you are not the intended recipient, you are strictly prohibited from reviewing, disclosing, copying using or disseminating any of this information or taking any action in reliance on or regarding this information. If you have received this fax in error, please notify us immediately by telephone so that we can arrange for its return to [place]. Phone: [phone], Toll-Free: [phone], Fax: [phone] Page: 1 of 2 Call Id: [id number] [place] Patient Name: Andrea Peters Gender: Female DOB: Feb 12, 1946 Age: 69 Y 67 M 2 D Return Phone Number: [phone] (Primary) Address: City/State/Zip: [place] [address] Client [facility] - Client Client Site [place], [place] Contact Type Call Call Type Triage / Clinical Relationship To Patient Self Appointment Disposition EMR Appointment Scheduled Info pasted into Epic Yes Return Phone Number [phone] (Primary) Chief Complaint Headache Initial Comment Caller states has developed a hacking chest cough, chills, fever, and headache. [place] Not Listed Appt. scheduled for 2:15 with Dr. Glori Bickers PreDisposition Call Doctor Nurse Assessment Nurse: Verlin Fester, RN, [facility] Date/Time Eilene Ghazi Time): 03/20/2015 8:27:21 AM Confirm and document reason for call. If symptomatic, describe symptoms. ---Patient states she has a cough, chest congestion, chills, fever (unknown) and headache. States she thinks she has a low grade fever. Has the patient traveled out of the country within the last  30 days? ---No Does the patient require triage? ---Yes Related visit to physician within the last 2 weeks? ---No Does the PT have any chronic conditions? (i.e. diabetes, asthma, etc.) ---Yes List chronic conditions. ---"melanoma, cancer, HTN, gout, Guidelines Guideline Title Affirmed Question Affirmed Notes Nurse Date/Time (Eastern Time) Cough - Acute Productive [1] Fever > 100.5 F (38.1 C) AND [2] diabetes mellitus or weak immune system (e.g., HIV positive, cancer chemo, splenectomy, organ transplant, chronic steroids) Noe, RN, [facility] 03/20/2015 8:29:00 AM Disp. Time Eilene Ghazi Time) Disposition Final User 03/20/2015 8:32:56 AM See Physician within 4 Hours (or PCP triage) Yes Noe, RN, [facility] PLEASE NOTE: All timestamps contained within this report are represented as [place] Standard Time. CONFIDENTIALTY NOTICE: This fax transmission is intended only for the addressee. It contains information that is legally privileged, confidential or otherwise protected from use or disclosure. If you are not the intended recipient, you are strictly prohibited from reviewing, disclosing, copying using or disseminating any of this information or taking any action in reliance on or regarding this information. If you have received this fax in error, please notify us immediately by telephone so that we can arrange for its return to [place]. Phone: [phone], Toll-Free: [phone], Fax: [phone] Page: 2 of 2 Call Id: [id number] [place] Understands: Yes Disagree/Comply: Comply Care Advice Given Per Guideline SEE PHYSICIAN WITHIN 4 HOURS (or PCP triage): FEVER MEDICINE - ACETAMINOPHEN: * Fever above 101 F (38.3 C) should be treated with acetaminophen (e.g., Tylenol). This can be taken by mouth as pills or per rectum using a suppository. Both are available over the counter. Usual adult dose is 650 mg by mouth or per rectum every 6 hours. * The goal of fever therapy is to bring the fever down to a  comfortable level. Remember that fever medicine usually lowers fever 2-3 F (1-1.5  C). CALL BACK IF: * You become worse. CARE ADVICE given per Cough - Acute Productive (Adult) guideline. After Care Instructions Given Call Event Type User Date / Time Description Referrals REFERRED TO PCP OFFICE

## 2015-03-20 NOTE — Telephone Encounter (Signed)
Patient Name: Andrea Peters  DOB: Oct 10, 1945    Initial Comment Caller states has developed a hacking chest cough, chills, fever, and headache.   Nurse Assessment      Guidelines    Guideline Title Affirmed Question Affirmed Notes  Cough - Acute Productive [1] Fever > 100.5 F (38.1 C) AND [2] diabetes mellitus or weak immune system (e.g., HIV positive, cancer chemo, splenectomy, organ transplant, chronic steroids)    Final Disposition User   See Physician within 4 Hours (or PCP triage) Verlin Fester, RN, [facility]    Referrals  REFERRED TO PCP OFFICE   Disagree/Comply: Comply

## 2015-03-20 NOTE — Patient Instructions (Signed)
Take tessalon for cough as needed  Get rest and fluids  Take zpak as directed Chest xray now

## 2015-03-22 NOTE — Assessment & Plan Note (Signed)
More congested on R side on exam  Prolonged symptoms - CXR now  Cover with zpak Tessalon for cough Update

## 2015-03-24 ENCOUNTER — Telehealth: Payer: Self-pay | Admitting: *Deleted

## 2015-03-24 MED ORDER — AZITHROMYCIN 250 MG PO TABS: ORAL_TABLET | ORAL | Status: DC

## 2015-03-24 NOTE — Telephone Encounter (Signed)
Rx sent and pt notified.

## 2015-03-24 NOTE — Telephone Encounter (Signed)
-----   Message from Abner Greenspan, MD sent at 03/24/2015 11:45 AM EDT ----- Please send in another z pack - take as directed  Keep me updated please

## 2015-03-31 ENCOUNTER — Telehealth: Payer: Self-pay | Admitting: Family Medicine

## 2015-03-31 DIAGNOSIS — R739 Hyperglycemia, unspecified: Secondary | ICD-10-CM

## 2015-03-31 DIAGNOSIS — I10 Essential (primary) hypertension: Secondary | ICD-10-CM

## 2015-03-31 NOTE — Telephone Encounter (Signed)
-----   Message from Ellamae Sia sent at 03/25/2015 10:45 AM EDT ----- Regarding: Lab orders for Wednesday, 7.27.16 Lab orders for a 6 month f/u

## 2015-04-01 ENCOUNTER — Other Ambulatory Visit (INDEPENDENT_AMBULATORY_CARE_PROVIDER_SITE_OTHER): Payer: Medicare Other

## 2015-04-01 DIAGNOSIS — I10 Essential (primary) hypertension: Secondary | ICD-10-CM

## 2015-04-01 DIAGNOSIS — R739 Hyperglycemia, unspecified: Secondary | ICD-10-CM

## 2015-04-01 LAB — COMPREHENSIVE METABOLIC PANEL
ALK PHOS: 58 U/L (ref 39–117)
ALT: 18 U/L (ref 0–35)
AST: 21 U/L (ref 0–37)
Albumin: 4.3 g/dL (ref 3.5–5.2)
BILIRUBIN TOTAL: 0.7 mg/dL (ref 0.2–1.2)
BUN: 23 mg/dL (ref 6–23)
CHLORIDE: 100 meq/L (ref 96–112)
CO2: 35 meq/L — AB (ref 19–32)
CREATININE: 1.13 mg/dL (ref 0.40–1.20)
Calcium: 10.3 mg/dL (ref 8.4–10.5)
GFR: 50.5 mL/min — AB (ref >60.00)
GLUCOSE: 104 mg/dL — AB (ref 70–99)
Potassium: 4 mEq/L (ref 3.5–5.1)
Sodium: 140 mEq/L (ref 135–145)
Total Protein: 7.1 g/dL (ref 6.0–8.3)

## 2015-04-01 LAB — HEMOGLOBIN A1C: Hgb A1c MFr Bld: 6.1 % (ref 4.6–6.5)

## 2015-04-08 ENCOUNTER — Ambulatory Visit (INDEPENDENT_AMBULATORY_CARE_PROVIDER_SITE_OTHER): Payer: Medicare Other | Admitting: Family Medicine

## 2015-04-08 ENCOUNTER — Encounter: Payer: Self-pay | Admitting: Family Medicine

## 2015-04-08 VITALS — BP 132/75 | HR 67 | Temp 98.1°F | Ht 62.0 in | Wt 201.5 lb

## 2015-04-08 DIAGNOSIS — I10 Essential (primary) hypertension: Secondary | ICD-10-CM

## 2015-04-08 DIAGNOSIS — R739 Hyperglycemia, unspecified: Secondary | ICD-10-CM | POA: Diagnosis not present

## 2015-04-08 MED ORDER — SIMVASTATIN 20 MG PO TABS: ORAL_TABLET | ORAL | Status: DC

## 2015-04-08 NOTE — Progress Notes (Signed)
Pre visit review using our clinic review tool, if applicable. No additional management support is needed unless otherwise documented below in the visit note. 

## 2015-04-08 NOTE — Assessment & Plan Note (Signed)
bp in fair control at this time  BP Readings from Last 1 Encounters:  04/08/15 132/75   No changes needed Disc lifstyle change with low sodium diet and exercise  Enc getting back to exercise gradually

## 2015-04-08 NOTE — Assessment & Plan Note (Signed)
Improved after stopping sweet tea Lab Results  Component Value Date   HGBA1C 6.1 04/01/2015   Enc to keep up the good work Gradual return to exercise (after recent pneumonia/bronchitis) Enc wt loss to prevent DM

## 2015-04-08 NOTE — Patient Instructions (Addendum)
Keep working on better diet / stay off the sweet tea- it will prevent diabetes Blood sugar is a little better  Gradually increase you exercise and this will help weight loss   Schedule annual exam after 10/16/15 with labs prior

## 2015-04-08 NOTE — Progress Notes (Signed)
Subjective:    Patient ID: Andrea Peters, female    DOB: 03-Sep-1946, 69 y.o.   MRN: [id number]  HPI Here for f/u of chronic medical problems  Had to lay on couch for a while    Is overall feeling much better   Wt is up 1 lb with bmi of 36  bp is up slt  today  No cp or palpitations or headaches or edema  No side effects to medicines  BP Readings from Last 3 Encounters:  04/08/15 138/70  03/20/15 128/60  10/15/14 136/70      Hyperglycemia Lab Results  Component Value Date   HGBA1C 6.1 04/01/2015  she cut out the sweet tea - misses it but doing ok - once in a while gets it as a treat  Does not like unsweet tea  This is down from 6.3 Diet is not a lot different   Started using an elliptical machine (2 mi per day)-plans to get back to it - can start now , gradually getting stamina up     Needs refill of simvastatin  Diet Lab Results  Component Value Date   CHOL 136 10/08/2014   HDL 47.40 10/08/2014   LDLCALC 70 10/08/2014   LDLDIRECT 141.9 01/23/2007   TRIG 91.0 10/08/2014   CHOLHDL 3 10/08/2014      Patient Active Problem List   Diagnosis Date Noted  . Acute bronchitis 03/20/2015  . Estrogen deficiency 10/15/2014  . Encounter for Medicare annual wellness exam 10/15/2013  . Colon cancer screening 10/15/2013  . Right-sided chest wall pain 10/15/2013  . Leg weakness, bilateral 03/13/2013  . Laryngitis 08/16/2011  . Other screening mammogram 03/30/2011  . Gout 11/17/2010  . CHOLELITHIASIS, HX OF 11/17/2010  . HYPERCHOLESTEROLEMIA, PURE 05/17/2007  . Hyperglycemia 05/17/2007  . ALLERGIC  RHINITIS 01/23/2007  . Essential hypertension 12/20/2006  . [place], [place] 12/20/2006  . URINARY INCONTINENCE, STRESS 12/20/2006   Past Medical History  Diagnosis Date  . Hypertension   . Hyperlipidemia   . Obesity   . Hyperglycemia   . Allergic rhinitis   . Mixed incontinence   . Plantar fasciitis     with significant disability  . Tendonitis of foot       L, chronic foot pain  . Melanoma    Past Surgical History  Procedure Laterality Date  . Abdominal hysterectomy      fibroids, ovaries intact  . Tendon repair  5/07    Tendon rupture L foot   History  Substance Use Topics  . Smoking status: Never Smoker   . Smokeless tobacco: Not on file  . Alcohol Use: No   Family History  Problem Relation Age of Onset  . Heart attack Mother   . Other Mother     ?thyroid problems  . Lung cancer Father    Allergies  Allergen Reactions  . Lipitor [Atorvastatin]     Muscle pain  . Naproxen Sodium    Current Outpatient Prescriptions on File Prior to Visit  Medication Sig Dispense Refill  . allopurinol (ZYLOPRIM) 300 MG tablet Take 1 tablet (300 mg total) by mouth daily. 90 tablet 1  . aspirin 81 MG tablet Take 81 mg by mouth daily.      Marland Kitchen CALCIUM-VITAMIN D PO Take 1 tablet by mouth daily.      . colchicine 0.6 MG tablet as needed. Take 2  tablets now with food and then 1 tablet twice a day with food    . fluticasone (  FLONASE) 50 MCG/ACT nasal spray PLACE 2 SPRAYS INTO BOTH NOSTRILS DAILY. 48 g 0  . guaiFENesin (MUCINEX) 600 MG 12 hr tablet Take 1,200 mg by mouth daily.    . hydrochlorothiazide (HYDRODIURIL) 25 MG tablet Take 1 tablet (25 mg total) by mouth daily. 90 tablet 1  . meloxicam (MOBIC) 15 MG tablet TAKE 1 TABLET BY MOUTH DAILY. 90 tablet 0  . Multiple Vitamin (MULTIVITAMIN) tablet Take 2 tablets by mouth daily.     Marland Kitchen oxybutynin (DITROPAN-XL) 10 MG 24 hr tablet Take 1 tablet (10 mg total) by mouth at bedtime. 90 tablet 3  . valsartan-hydrochlorothiazide (DIOVAN-HCT) 160-12.5 MG per tablet TAKE 1 TABLET DAILY 90 tablet 1   No current facility-administered medications on file prior to visit.    Review of Systems Review of Systems  Constitutional: Negative for fever, appetite change, fatigue and unexpected weight change.  Eyes: Negative for pain and visual disturbance.  Respiratory: Negative for cough and shortness of breath.    Cardiovascular: Negative for cp or palpitations    Gastrointestinal: Negative for nausea, diarrhea and constipation.  Genitourinary: Negative for urgency and frequency.  Skin: Negative for pallor or rash   Neurological: Negative for weakness, light-headedness, numbness and headaches.  Hematological: Negative for adenopathy. Does not bruise/bleed easily.  Psychiatric/Behavioral: Negative for dysphoric mood. The patient is not nervous/anxious.         Objective:   Physical Exam  Constitutional: She appears well-developed and well-nourished. No distress.  obese and well appearing   HENT:  Head: Normocephalic and atraumatic.  Mouth/Throat: Oropharynx is clear and moist.  Eyes: Conjunctivae and EOM are normal. Pupils are equal, round, and reactive to light.  Neck: Normal range of motion. Neck supple. No JVD present. Carotid bruit is not present. No thyromegaly present.  Cardiovascular: Normal rate, regular rhythm, normal heart sounds and intact distal pulses.  Exam reveals no gallop.   Pulmonary/Chest: Effort normal and breath sounds normal. No respiratory distress. She has no wheezes. She has no rales.  No crackles  Good air exch No pleuritic pain   Abdominal: Soft. Bowel sounds are normal. She exhibits no distension, no abdominal bruit and no mass. There is no tenderness.  Musculoskeletal: She exhibits no edema.  Lymphadenopathy:    She has no cervical adenopathy.  Neurological: She is alert. She has normal reflexes.  Skin: Skin is warm and dry. No rash noted.  Psychiatric: She has a normal mood and affect.          Assessment & Plan:   Problem List Items Addressed This Visit    Essential hypertension - Primary    bp in fair control at this time  BP Readings from Last 1 Encounters:  04/08/15 132/75   No changes needed Disc lifstyle change with low sodium diet and exercise  Enc getting back to exercise gradually        Relevant Medications   simvastatin (ZOCOR) 20  MG tablet   Hyperglycemia    Improved after stopping sweet tea Lab Results  Component Value Date   HGBA1C 6.1 04/01/2015   Enc to keep up the good work Gradual return to exercise (after recent pneumonia/bronchitis) Enc wt loss to prevent DM

## 2015-04-20 ENCOUNTER — Other Ambulatory Visit: Payer: Self-pay | Admitting: Family Medicine

## 2015-04-20 NOTE — Telephone Encounter (Signed)
Please refill until appt

## 2015-04-20 NOTE — Telephone Encounter (Signed)
Pt has CPE scheduled 10/19/15, Mobic last refilled on  01/15/15 #90 with 0 refills, please advise

## 2015-04-21 NOTE — Telephone Encounter (Signed)
done

## 2015-04-27 ENCOUNTER — Other Ambulatory Visit: Payer: Self-pay | Admitting: *Deleted

## 2015-04-27 NOTE — Telephone Encounter (Signed)
Fax refill request, pt has CPE on 10/19/15 scheduled. Zegerid not on med list and no history we have filled this Rx in the past, is it okay to refill, please advise

## 2015-04-27 NOTE — Telephone Encounter (Signed)
Please call pt to clarify Thanks

## 2015-04-28 MED ORDER — OXYBUTYNIN CHLORIDE ER 10 MG PO TB24: 10.0000 mg | ORAL_TABLET | Freq: Every day | ORAL | Status: DC

## 2015-04-28 MED ORDER — VALSARTAN-HYDROCHLOROTHIAZIDE 160-12.5 MG PO TABS: 1.0000 | ORAL_TABLET | Freq: Every day | ORAL | Status: DC

## 2015-04-28 NOTE — Telephone Encounter (Signed)
Tried to call patient and line was busy. Will have to try again later.

## 2015-05-01 DIAGNOSIS — H0231 Blepharochalasis right upper eyelid: Secondary | ICD-10-CM | POA: Diagnosis not present

## 2015-05-01 DIAGNOSIS — D210 Benign neoplasm of connective and other soft tissue of head, face and neck: Secondary | ICD-10-CM | POA: Diagnosis not present

## 2015-05-01 DIAGNOSIS — H2513 Age-related nuclear cataract, bilateral: Secondary | ICD-10-CM | POA: Diagnosis not present

## 2015-05-01 DIAGNOSIS — H43391 Other vitreous opacities, right eye: Secondary | ICD-10-CM | POA: Diagnosis not present

## 2015-05-01 DIAGNOSIS — H0234 Blepharochalasis left upper eyelid: Secondary | ICD-10-CM | POA: Diagnosis not present

## 2015-05-08 DIAGNOSIS — D229 Melanocytic nevi, unspecified: Secondary | ICD-10-CM | POA: Diagnosis not present

## 2015-05-08 DIAGNOSIS — L814 Other melanin hyperpigmentation: Secondary | ICD-10-CM | POA: Diagnosis not present

## 2015-05-08 DIAGNOSIS — L929 Granulomatous disorder of the skin and subcutaneous tissue, unspecified: Secondary | ICD-10-CM | POA: Diagnosis not present

## 2015-05-08 DIAGNOSIS — Z85820 Personal history of malignant melanoma of skin: Secondary | ICD-10-CM | POA: Diagnosis not present

## 2015-05-08 DIAGNOSIS — L708 Other acne: Secondary | ICD-10-CM | POA: Diagnosis not present

## 2015-05-08 DIAGNOSIS — D485 Neoplasm of uncertain behavior of skin: Secondary | ICD-10-CM | POA: Diagnosis not present

## 2015-05-08 DIAGNOSIS — I788 Other diseases of capillaries: Secondary | ICD-10-CM | POA: Diagnosis not present

## 2015-05-08 DIAGNOSIS — Z1283 Encounter for screening for malignant neoplasm of skin: Secondary | ICD-10-CM | POA: Diagnosis not present

## 2015-05-08 DIAGNOSIS — L859 Epidermal thickening, unspecified: Secondary | ICD-10-CM | POA: Diagnosis not present

## 2015-05-08 DIAGNOSIS — L739 Follicular disorder, unspecified: Secondary | ICD-10-CM | POA: Diagnosis not present

## 2015-06-01 DIAGNOSIS — I2510 Atherosclerotic heart disease of native coronary artery without angina pectoris: Secondary | ICD-10-CM | POA: Diagnosis not present

## 2015-06-01 DIAGNOSIS — C439 Malignant melanoma of skin, unspecified: Secondary | ICD-10-CM | POA: Diagnosis not present

## 2015-06-09 DIAGNOSIS — C4361 Malignant melanoma of right upper limb, including shoulder: Secondary | ICD-10-CM | POA: Diagnosis not present

## 2015-08-17 DIAGNOSIS — H02834 Dermatochalasis of left upper eyelid: Secondary | ICD-10-CM | POA: Diagnosis not present

## 2015-08-17 DIAGNOSIS — H02831 Dermatochalasis of right upper eyelid: Secondary | ICD-10-CM | POA: Diagnosis not present

## 2015-09-16 DIAGNOSIS — H2513 Age-related nuclear cataract, bilateral: Secondary | ICD-10-CM | POA: Diagnosis not present

## 2015-09-16 DIAGNOSIS — H04123 Dry eye syndrome of bilateral lacrimal glands: Secondary | ICD-10-CM | POA: Diagnosis not present

## 2015-09-16 DIAGNOSIS — H02831 Dermatochalasis of right upper eyelid: Secondary | ICD-10-CM | POA: Diagnosis not present

## 2015-09-16 DIAGNOSIS — H02834 Dermatochalasis of left upper eyelid: Secondary | ICD-10-CM | POA: Diagnosis not present

## 2015-10-05 ENCOUNTER — Telehealth: Payer: Self-pay | Admitting: Family Medicine

## 2015-10-05 DIAGNOSIS — M109 Gout, unspecified: Secondary | ICD-10-CM

## 2015-10-05 DIAGNOSIS — I10 Essential (primary) hypertension: Secondary | ICD-10-CM

## 2015-10-05 DIAGNOSIS — R739 Hyperglycemia, unspecified: Secondary | ICD-10-CM

## 2015-10-05 DIAGNOSIS — E7800 Pure hypercholesterolemia, unspecified: Secondary | ICD-10-CM

## 2015-10-05 NOTE — Telephone Encounter (Signed)
-----   Message from Ellamae Sia sent at 10/05/2015 11:50 AM EST ----- Regarding: Lab orders for Tuesday, 2.7.17 Patient is scheduled for CPX labs, please order future labs, Thanks , Karna Christmas

## 2015-10-13 ENCOUNTER — Other Ambulatory Visit (INDEPENDENT_AMBULATORY_CARE_PROVIDER_SITE_OTHER): Payer: Medicare Other

## 2015-10-13 DIAGNOSIS — M109 Gout, unspecified: Secondary | ICD-10-CM

## 2015-10-13 DIAGNOSIS — R739 Hyperglycemia, unspecified: Secondary | ICD-10-CM

## 2015-10-13 DIAGNOSIS — E7800 Pure hypercholesterolemia, unspecified: Secondary | ICD-10-CM

## 2015-10-13 DIAGNOSIS — I10 Essential (primary) hypertension: Secondary | ICD-10-CM

## 2015-10-13 LAB — CBC WITH DIFFERENTIAL/PLATELET
BASOS PCT: 0.4 % (ref 0.0–3.0)
Basophils Absolute: 0 10*3/uL (ref 0.0–0.1)
Eosinophils Absolute: 0.3 10*3/uL (ref 0.0–0.7)
Eosinophils Relative: 4.5 % (ref 0.0–5.0)
HCT: 39.1 % (ref 36.0–46.0)
HEMOGLOBIN: 12.7 g/dL (ref 12.0–15.0)
Lymphocytes Relative: 25.5 % (ref 12.0–46.0)
Lymphs Abs: 1.7 10*3/uL (ref 0.7–4.0)
MCHC: 32.5 g/dL (ref 30.0–36.0)
MCV: 94.8 fl (ref 78.0–100.0)
MONO ABS: 0.5 10*3/uL (ref 0.1–1.0)
Monocytes Relative: 8 % (ref 3.0–12.0)
NEUTROS ABS: 4.2 10*3/uL (ref 1.4–7.7)
Neutrophils Relative %: 61.6 % (ref 43.0–77.0)
Platelets: 258 10*3/uL (ref 150.0–400.0)
RBC: 4.12 Mil/uL (ref 3.87–5.11)
RDW: 14.5 % (ref 11.5–15.5)
WBC: 6.8 10*3/uL (ref 4.0–10.5)

## 2015-10-13 LAB — COMPREHENSIVE METABOLIC PANEL
ALBUMIN: 4.2 g/dL (ref 3.5–5.2)
ALT: 17 U/L (ref 0–35)
AST: 17 U/L (ref 0–37)
Alkaline Phosphatase: 54 U/L (ref 39–117)
BUN: 28 mg/dL — AB (ref 6–23)
CHLORIDE: 102 meq/L (ref 96–112)
CO2: 35 mEq/L — ABNORMAL HIGH (ref 19–32)
Calcium: 10.3 mg/dL (ref 8.4–10.5)
Creatinine, Ser: 0.96 mg/dL (ref 0.40–1.20)
GFR: 60.87 mL/min (ref >60.00)
Glucose, Bld: 106 mg/dL — ABNORMAL HIGH (ref 70–99)
POTASSIUM: 3.3 meq/L — AB (ref 3.5–5.1)
SODIUM: 142 meq/L (ref 135–145)
Total Bilirubin: 0.7 mg/dL (ref 0.2–1.2)
Total Protein: 6.9 g/dL (ref 6.0–8.3)

## 2015-10-13 LAB — LIPID PANEL
CHOLESTEROL: 128 mg/dL (ref 0–200)
HDL: 45.8 mg/dL (ref >39.00)
LDL Cholesterol: 68 mg/dL (ref 0–99)
NONHDL: 82.06
TRIGLYCERIDES: 70 mg/dL (ref 0.0–149.0)
Total CHOL/HDL Ratio: 3
VLDL: 14 mg/dL (ref 0.0–40.0)

## 2015-10-13 LAB — TSH: TSH: 3.18 u[IU]/mL (ref 0.35–4.50)

## 2015-10-13 LAB — URIC ACID: URIC ACID, SERUM: 5.4 mg/dL (ref 2.4–7.0)

## 2015-10-13 LAB — HEMOGLOBIN A1C: HEMOGLOBIN A1C: 6 % (ref 4.6–6.5)

## 2015-10-19 ENCOUNTER — Other Ambulatory Visit: Payer: Self-pay | Admitting: Family Medicine

## 2015-10-19 ENCOUNTER — Ambulatory Visit (INDEPENDENT_AMBULATORY_CARE_PROVIDER_SITE_OTHER): Payer: Medicare Other | Admitting: Family Medicine

## 2015-10-19 ENCOUNTER — Encounter: Payer: Self-pay | Admitting: Family Medicine

## 2015-10-19 VITALS — BP 138/75 | HR 59 | Temp 98.3°F | Ht 61.75 in | Wt 201.0 lb

## 2015-10-19 DIAGNOSIS — E876 Hypokalemia: Secondary | ICD-10-CM | POA: Diagnosis not present

## 2015-10-19 DIAGNOSIS — R739 Hyperglycemia, unspecified: Secondary | ICD-10-CM | POA: Diagnosis not present

## 2015-10-19 DIAGNOSIS — Z23 Encounter for immunization: Secondary | ICD-10-CM | POA: Diagnosis not present

## 2015-10-19 DIAGNOSIS — Z0000 Encounter for general adult medical examination without abnormal findings: Secondary | ICD-10-CM | POA: Diagnosis not present

## 2015-10-19 DIAGNOSIS — I10 Essential (primary) hypertension: Secondary | ICD-10-CM

## 2015-10-19 DIAGNOSIS — M8580 Other specified disorders of bone density and structure, unspecified site: Secondary | ICD-10-CM

## 2015-10-19 DIAGNOSIS — E7800 Pure hypercholesterolemia, unspecified: Secondary | ICD-10-CM | POA: Diagnosis not present

## 2015-10-19 DIAGNOSIS — Z1231 Encounter for screening mammogram for malignant neoplasm of breast: Secondary | ICD-10-CM

## 2015-10-19 MED ORDER — POTASSIUM CHLORIDE ER 10 MEQ PO TBCR: 10.0000 meq | EXTENDED_RELEASE_TABLET | Freq: Every day | ORAL | Status: DC

## 2015-10-19 NOTE — Assessment & Plan Note (Signed)
Disc goals for lipids and reasons to control them Rev labs with pt Rev low sat fat diet in detail  Overall fairly controlled with simvastatin and diet

## 2015-10-19 NOTE — Patient Instructions (Signed)
Don't forget to schedule your mammogram for April  Flu shot today  Do start some regular exercise when you can for bone health and weight loss Eat a low sugar diet/ low fat diet - for borderline diabetes and work on weight loss  Start potassium one pill daily  Schedule non fasting lab in 2-3 weeks for potassium level

## 2015-10-19 NOTE — Assessment & Plan Note (Signed)
Likely due to diuretic  No symptoms  K of 3.3 Will start 10 meq of KCL Re check level in 2-3 weeks

## 2015-10-19 NOTE — Progress Notes (Signed)
Pre visit review using our clinic review tool, if applicable. No additional management support is needed unless otherwise documented below in the visit note. 

## 2015-10-19 NOTE — Assessment & Plan Note (Signed)
Reviewed last dexa No falls or fx Disc need for calcium/ vitamin D/ wt bearing exercise and bone density test every 2 y to monitor Disc safety/ fracture risk in detail

## 2015-10-19 NOTE — Progress Notes (Signed)
Subjective:    Patient ID: Andrea Peters, female    DOB: 10-26-1945, 70 y.o.   MRN: [id number]  HPI Here for annual medicare wellness visit as well as chronic/acute medical problems   I have personally reviewed the Medicare Annual Wellness questionnaire and have noted 1. The patient's medical and social history 2. Their use of alcohol, tobacco or illicit drugs 3. Their current medications and supplements 4. The patient's functional ability including ADL's, fall risks, home safety risks and hearing or visual             impairment. 5. Diet and physical activities 6. Evidence for depression or mood disorders  The patients weight, height, BMI have been recorded in the chart and visual acuity is per eye clinic.  I have made referrals, counseling and provided education to the patient based review of the above and I have provided the pt with a written personalized care plan for preventive services. Reviewed and updated provider list, see scanned forms.  See scanned forms.  Routine anticipatory guidance given to patient.  See health maintenance. Colon cancer screening 3/15 at [place]- found a few polyps -no recall  Breast cancer screening 4/16 nl  Self breast exam-no lumps  Skin cancer (melanoma) follow up is frequent -doing well Flu vaccine - has not had yet- needs it  Hep C screen--no risk factors , declines  Tetanus vaccine 5/09 Pneumovax-has had both the 23 and the 13 ---up to date  Zoster vaccine 6/09 dexa 4/16- osteopenia  She takes her ca and D, no regular exercise  No falls or fx  Advance directive has a living will and poa Cognitive function addressed- see scanned forms- and if abnormal then additional documentation follows. - no concerns about memory / occ name recall takes a bit   PMH and SH reviewed  Meds, vitals, and allergies reviewed.   ROS: See HPI.  Otherwise negative.    Going to the eye doctor -watching retina (? Freckle or lentigo)   Wt is stable  with bmi of 37 Obese range   bp is up on first check  today (thinks it is from the stress of caring for her husband after knee replacement) No cp or palpitations or headaches or edema  No side effects to medicines  BP Readings from Last 3 Encounters:  10/19/15 158/82  04/08/15 132/75  03/20/15 128/60     K is low   Chemistry      Component Value Date/Time   NA 142 10/13/2015 0845   K 3.3* 10/13/2015 0845   CL 102 10/13/2015 0845   CO2 35* 10/13/2015 0845   BUN 28* 10/13/2015 0845   CREATININE 0.96 10/13/2015 0845      Component Value Date/Time   CALCIUM 10.3 10/13/2015 0845   ALKPHOS 54 10/13/2015 0845   AST 17 10/13/2015 0845   ALT 17 10/13/2015 0845   BILITOT 0.7 10/13/2015 0845    Takes diovan hct   Hyperglycemia Lab Results  Component Value Date   HGBA1C 6.0 10/13/2015   This is down from 6.1 Does not eat sweets often  Ordinary - is careful about carbs (loves them - but careful)     Hyperlipidemia Lab Results  Component Value Date   CHOL 128 10/13/2015   CHOL 136 10/08/2014   CHOL 128 10/08/2013   Lab Results  Component Value Date   HDL 45.80 10/13/2015   HDL 47.40 10/08/2014   HDL 48.70 10/08/2013   Lab Results  Component  Value Date   LDLCALC 68 10/13/2015   LDLCALC 70 10/08/2014   LDLCALC 66 10/08/2013   Lab Results  Component Value Date   TRIG 70.0 10/13/2015   TRIG 91.0 10/08/2014   TRIG 66.0 10/08/2013   Lab Results  Component Value Date   CHOLHDL 3 10/13/2015   CHOLHDL 3 10/08/2014   CHOLHDL 3 10/08/2013   Lab Results  Component Value Date   LDLDIRECT 141.9 01/23/2007   good profile  Fairly stable  simvastatin is working Marcelina Morel eats very low fat   Patient Active Problem List   Diagnosis Date Noted  . Hypokalemia 10/19/2015  . Osteopenia 10/19/2015  . Acute bronchitis 03/20/2015  . Estrogen deficiency 10/15/2014  . Encounter for Medicare annual wellness exam 10/15/2013  . Colon cancer screening 10/15/2013  . Right-sided  chest wall pain 10/15/2013  . Leg weakness, bilateral 03/13/2013  . Laryngitis 08/16/2011  . Other screening mammogram 03/30/2011  . Gout 11/17/2010  . CHOLELITHIASIS, HX OF 11/17/2010  . HYPERCHOLESTEROLEMIA, PURE 05/17/2007  . Hyperglycemia 05/17/2007  . ALLERGIC  RHINITIS 01/23/2007  . Essential hypertension 12/20/2006  . [place], [place] 12/20/2006  . URINARY INCONTINENCE, STRESS 12/20/2006   Past Medical History  Diagnosis Date  . Hypertension   . Hyperlipidemia   . Obesity   . Hyperglycemia   . Allergic rhinitis   . Mixed incontinence   . Plantar fasciitis     with significant disability  . Tendonitis of foot     L, chronic foot pain  . Melanoma [facility])    Past Surgical History  Procedure Laterality Date  . Abdominal hysterectomy      fibroids, ovaries intact  . Tendon repair  5/07    Tendon rupture L foot   Social History  Substance Use Topics  . Smoking status: Never Smoker   . Smokeless tobacco: None  . Alcohol Use: No   Family History  Problem Relation Age of Onset  . Heart attack Mother   . Other Mother     ?thyroid problems  . Lung cancer Father    Allergies  Allergen Reactions  . Lipitor [Atorvastatin]     Muscle pain  . Naproxen Sodium    Current Outpatient Prescriptions on File Prior to Visit  Medication Sig Dispense Refill  . aspirin 81 MG tablet Take 81 mg by mouth daily.      Marland Kitchen CALCIUM-VITAMIN D PO Take 1 tablet by mouth daily.      . colchicine 0.6 MG tablet as needed. Take 2  tablets now with food and then 1 tablet twice a day with food    . fluticasone (FLONASE) 50 MCG/ACT nasal spray PLACE 2 SPRAYS INTO BOTH NOSTRILS DAILY. 48 g 1  . guaiFENesin (MUCINEX) 600 MG 12 hr tablet Take 1,200 mg by mouth daily.    . meloxicam (MOBIC) 15 MG tablet TAKE 1 TABLET BY MOUTH DAILY. 90 tablet 1  . Multiple Vitamin (MULTIVITAMIN) tablet Take 2 tablets by mouth daily.     Marland Kitchen oxybutynin (DITROPAN-XL) 10 MG 24 hr tablet Take 1 tablet (10 mg total) by  mouth at bedtime. 90 tablet 3  . simvastatin (ZOCOR) 20 MG tablet TAKE 1 TABLET (20 MG TOTAL) BY MOUTH AT BEDTIME. 90 tablet 3  . valsartan-hydrochlorothiazide (DIOVAN-HCT) 160-12.5 MG per tablet Take 1 tablet by mouth daily. 90 tablet 3   No current facility-administered medications on file prior to visit.     Review of Systems Review of Systems  Constitutional: Negative for fever,  appetite change, fatigue and unexpected weight change.  Eyes: Negative for pain and visual disturbance.  Respiratory: Negative for cough and shortness of breath.   Cardiovascular: Negative for cp or palpitations    Gastrointestinal: Negative for nausea, diarrhea and constipation.  Genitourinary: Negative for urgency and frequency.  Skin: Negative for pallor or rash   Neurological: Negative for weakness, light-headedness, numbness and headaches.  Hematological: Negative for adenopathy. Does not bruise/bleed easily.  Psychiatric/Behavioral: Negative for dysphoric mood. The patient is not nervous/anxious. Pos for stressors         Objective:   Physical Exam  Constitutional: She appears well-developed and well-nourished. No distress.  overwt and well app  HENT:  Head: Normocephalic and atraumatic.  Right Ear: External ear normal.  Left Ear: External ear normal.  Mouth/Throat: Oropharynx is clear and moist.  Eyes: Conjunctivae and EOM are normal. Pupils are equal, round, and reactive to light. No scleral icterus.  Neck: Normal range of motion. Neck supple. No JVD present. Carotid bruit is not present. No thyromegaly present.  Cardiovascular: Normal rate, regular rhythm, normal heart sounds and intact distal pulses.  Exam reveals no gallop.   Pulmonary/Chest: Effort normal and breath sounds normal. No respiratory distress. She has no wheezes. She exhibits no tenderness.  Abdominal: Soft. Bowel sounds are normal. She exhibits no distension, no abdominal bruit and no mass. There is no tenderness.    Genitourinary: No breast swelling, tenderness, discharge or bleeding.  Breast exam: No mass, nodules, thickening, tenderness, bulging, retraction, inflamation, nipple discharge or skin changes noted.  No axillary or clavicular LA.      Musculoskeletal: Normal range of motion. She exhibits no edema or tenderness.  No kyphosis   Lymphadenopathy:    She has no cervical adenopathy.  Neurological: She is alert. She has normal reflexes. No cranial nerve deficit. She exhibits normal muscle tone. Coordination normal.  Skin: Skin is warm and dry. No rash noted. No erythema. No pallor.  Psychiatric: She has a normal mood and affect.          Assessment & Plan:   Problem List Items Addressed This Visit      Cardiovascular and Mediastinum   Essential hypertension - Primary    bp in fair control at this time  BP Readings from Last 1 Encounters:  10/19/15 138/75  better on 2nd check  No changes needed Disc lifstyle change with low sodium diet and exercise  Labs reviewed          Musculoskeletal and Integument   Osteopenia    Reviewed last dexa No falls or fx Disc need for calcium/ vitamin D/ wt bearing exercise and bone density test every 2 y to monitor Disc safety/ fracture risk in detail          Other   Encounter for Medicare annual wellness exam    Reviewed health habits including diet and exercise and skin cancer prevention Reviewed appropriate screening tests for age  Also reviewed health mt list, fam hx and immunization status , as well as social and family history   See HPI Labs reviewed Don't forget to schedule your mammogram for April  Flu shot today  Do start some regular exercise when you can for bone health and weight loss Eat a low sugar diet/ low fat diet - for borderline diabetes and work on weight loss  Start potassium one pill daily  Schedule non fasting lab in 2-3 weeks for potassium level       HYPERCHOLESTEROLEMIA,  PURE    Disc goals for lipids and  reasons to control them Rev labs with pt Rev low sat fat diet in detail  Overall fairly controlled with simvastatin and diet       Hyperglycemia    Lab Results  Component Value Date   HGBA1C 6.0 10/13/2015   This is stable Disc need for wt loss and low glycemic diet to prevent DM2      Hypokalemia    Likely due to diuretic  No symptoms  K of 3.3 Will start 10 meq of KCL Re check level in 2-3 weeks        Other Visit Diagnoses    Need for influenza vaccination        Relevant Orders    Flu Vaccine QUAD 36+ mos PF IM (Fluarix & Fluzone Quad PF) (Completed)

## 2015-10-19 NOTE — Assessment & Plan Note (Signed)
Lab Results  Component Value Date   HGBA1C 6.0 10/13/2015   This is stable Disc need for wt loss and low glycemic diet to prevent DM2

## 2015-10-19 NOTE — Assessment & Plan Note (Signed)
Reviewed health habits including diet and exercise and skin cancer prevention Reviewed appropriate screening tests for age  Also reviewed health mt list, fam hx and immunization status , as well as social and family history   See HPI Labs reviewed Don't forget to schedule your mammogram for April  Flu shot today  Do start some regular exercise when you can for bone health and weight loss Eat a low sugar diet/ low fat diet - for borderline diabetes and work on weight loss  Start potassium one pill daily  Schedule non fasting lab in 2-3 weeks for potassium level

## 2015-10-19 NOTE — Assessment & Plan Note (Signed)
bp in fair control at this time  BP Readings from Last 1 Encounters:  10/19/15 138/75  better on 2nd check  No changes needed Disc lifstyle change with low sodium diet and exercise  Labs reviewed

## 2015-10-30 ENCOUNTER — Other Ambulatory Visit: Payer: Self-pay | Admitting: Family Medicine

## 2015-10-30 NOTE — Telephone Encounter (Signed)
Will refill electronically  

## 2015-10-30 NOTE — Telephone Encounter (Signed)
Pt had CPE on 10/19/15, and has next CPE scheduled for next year, last refilled on 04/21/15 #90 with 1 additional refill, please advise

## 2015-11-03 ENCOUNTER — Other Ambulatory Visit (INDEPENDENT_AMBULATORY_CARE_PROVIDER_SITE_OTHER): Payer: Medicare Other

## 2015-11-03 DIAGNOSIS — E876 Hypokalemia: Secondary | ICD-10-CM | POA: Diagnosis not present

## 2015-11-03 LAB — BASIC METABOLIC PANEL
BUN: 26 mg/dL — AB (ref 6–23)
CHLORIDE: 99 meq/L (ref 96–112)
CO2: 36 mEq/L — ABNORMAL HIGH (ref 19–32)
Calcium: 10.6 mg/dL — ABNORMAL HIGH (ref 8.4–10.5)
Creatinine, Ser: 1.06 mg/dL (ref 0.40–1.20)
GFR: 54.28 mL/min — ABNORMAL LOW (ref >60.00)
GLUCOSE: 112 mg/dL — AB (ref 70–99)
POTASSIUM: 3.8 meq/L (ref 3.5–5.1)
Sodium: 141 mEq/L (ref 135–145)

## 2015-11-04 ENCOUNTER — Telehealth: Payer: Self-pay | Admitting: *Deleted

## 2015-11-04 MED ORDER — KLOR-CON 10 10 MEQ PO TBCR: 10.0000 meq | EXTENDED_RELEASE_TABLET | Freq: Every day | ORAL | Status: DC

## 2015-11-04 NOTE — Telephone Encounter (Signed)
-----   Message from Abner Greenspan, MD sent at 11/04/2015 10:40 AM EST ----- Potassium is better Stay on current dose Make sure you are drinking enough water also

## 2015-11-04 NOTE — Telephone Encounter (Signed)
Pt notified of lab results and Dr. Marliss Coots comments. Rx for 90 day supply sent to pharmacy per pt request due to insurance requiring 90 day supply

## 2015-12-05 ENCOUNTER — Other Ambulatory Visit: Payer: Self-pay | Admitting: Family Medicine

## 2015-12-05 HISTORY — PX: EYE SURGERY: SHX253

## 2015-12-07 ENCOUNTER — Other Ambulatory Visit: Payer: Self-pay | Admitting: Family Medicine

## 2015-12-09 DIAGNOSIS — C4361 Malignant melanoma of right upper limb, including shoulder: Secondary | ICD-10-CM | POA: Diagnosis not present

## 2015-12-09 DIAGNOSIS — Z1211 Encounter for screening for malignant neoplasm of colon: Secondary | ICD-10-CM | POA: Diagnosis not present

## 2015-12-09 DIAGNOSIS — Z6836 Body mass index (BMI) 36.0-36.9, adult: Secondary | ICD-10-CM | POA: Diagnosis not present

## 2015-12-18 DIAGNOSIS — H02831 Dermatochalasis of right upper eyelid: Secondary | ICD-10-CM | POA: Diagnosis not present

## 2015-12-18 DIAGNOSIS — H02834 Dermatochalasis of left upper eyelid: Secondary | ICD-10-CM | POA: Diagnosis not present

## 2015-12-22 ENCOUNTER — Ambulatory Visit: Payer: Medicare Other

## 2015-12-29 ENCOUNTER — Other Ambulatory Visit: Payer: Self-pay | Admitting: Family Medicine

## 2015-12-29 ENCOUNTER — Ambulatory Visit
Admission: RE | Admit: 2015-12-29 | Discharge: 2015-12-29 | Disposition: A | Discharge status: Home / Self Care | Payer: Medicare Other | Source: Ambulatory Visit | Attending: Family Medicine | Admitting: Family Medicine

## 2015-12-29 DIAGNOSIS — Z1231 Encounter for screening mammogram for malignant neoplasm of breast: Secondary | ICD-10-CM

## 2015-12-29 LAB — HM MAMMOGRAPHY: HM MAMMO: NORMAL (ref 0–4)

## 2015-12-30 ENCOUNTER — Encounter: Payer: Self-pay | Admitting: Family Medicine

## 2015-12-30 ENCOUNTER — Encounter: Payer: Self-pay | Admitting: *Deleted

## 2016-03-29 ENCOUNTER — Encounter: Payer: Self-pay | Admitting: Family Medicine

## 2016-03-29 ENCOUNTER — Ambulatory Visit (INDEPENDENT_AMBULATORY_CARE_PROVIDER_SITE_OTHER)
Admission: RE | Admit: 2016-03-29 | Discharge: 2016-03-29 | Disposition: A | Discharge status: Home / Self Care | Payer: Medicare Other | Source: Ambulatory Visit | Attending: Family Medicine | Admitting: Family Medicine

## 2016-03-29 ENCOUNTER — Telehealth: Payer: Self-pay | Admitting: Family Medicine

## 2016-03-29 ENCOUNTER — Ambulatory Visit (INDEPENDENT_AMBULATORY_CARE_PROVIDER_SITE_OTHER): Payer: Medicare Other | Admitting: Family Medicine

## 2016-03-29 VITALS — BP 138/78 | HR 66 | Temp 98.3°F | Ht 61.75 in | Wt 201.2 lb

## 2016-03-29 DIAGNOSIS — R5382 Chronic fatigue, unspecified: Secondary | ICD-10-CM

## 2016-03-29 DIAGNOSIS — R05 Cough: Secondary | ICD-10-CM | POA: Diagnosis not present

## 2016-03-29 DIAGNOSIS — R059 Cough, unspecified: Secondary | ICD-10-CM

## 2016-03-29 DIAGNOSIS — R5383 Other fatigue: Secondary | ICD-10-CM | POA: Insufficient documentation

## 2016-03-29 MED ORDER — AZITHROMYCIN 250 MG PO TABS: ORAL_TABLET | ORAL | 0 refills | Status: DC

## 2016-03-29 MED ORDER — VALSARTAN-HYDROCHLOROTHIAZIDE 160-12.5 MG PO TABS: 1.0000 | ORAL_TABLET | Freq: Every day | ORAL | 3 refills | Status: DC

## 2016-03-29 MED ORDER — OXYBUTYNIN CHLORIDE ER 10 MG PO TB24: 10.0000 mg | ORAL_TABLET | Freq: Every day | ORAL | 3 refills | Status: DC

## 2016-03-29 NOTE — Telephone Encounter (Signed)
Patient Name: Andrea Peters  DOB: August 09, 1946    Initial Comment Caller states having bad fatigue for about a month, been having heavy breathing, getting a cough   Nurse Assessment  Nurse: Leilani Merl, RN, Nira Conn Date/Time (Eastern Time): 03/29/2016 8:51:47 AM  Confirm and document reason for call. If symptomatic, describe symptoms. You must click the next button to save text entered. ---Caller states having bad fatigue for about a month, been having heavy breathing, getting a cough  Has the patient traveled out of the country within the last 30 days? ---Not Applicable  Does the patient have any new or worsening symptoms? ---Yes  Will a triage be completed? ---Yes  Related visit to physician within the last 2 weeks? ---No  Does the PT have any chronic conditions? (i.e. diabetes, asthma, etc.) ---Yes  List chronic conditions. ---See MR  Is this a behavioral health or substance abuse call? ---No     Guidelines    Guideline Title Affirmed Question Affirmed Notes  Cough - Acute Non-Productive Wheezing is present    Final Disposition User   See Physician within 4 Hours (or PCP triage) Standifer, RN, Nira Conn    Comments  caller would like to be worked in to the schedule today at the office if possible.   Referrals  REFERRED TO PCP OFFICE   Disagree/Comply: Comply

## 2016-03-29 NOTE — Progress Notes (Signed)
Pre visit review using our clinic review tool, if applicable. No additional management support is needed unless otherwise documented below in the visit note. 

## 2016-03-29 NOTE — Patient Instructions (Signed)
Labs today for fatigue We will get a chest xray reading tomorrow Take care of yourself  Take the zpak as directed (in case of infection)

## 2016-03-29 NOTE — Telephone Encounter (Signed)
Pt will come in at 4:15pm today to see Dr. Glori Bickers  Message routed back to Dr. Glori Bickers to put in a chest xray order so she can get her xray when she first comes in

## 2016-03-29 NOTE — Telephone Encounter (Signed)
Chest xray ordered Please take her for that when she gets here thanks

## 2016-03-29 NOTE — Progress Notes (Signed)
Subjective:    Patient ID: Andrea Peters, female    DOB: 1946/06/25, 70 y.o.   MRN: [id number]  HPI Here with some respiratory symptoms  Has been very tired for a month or more  Then developed a hacking cough once in a while Some pain in R side (like a side stitch)  Cough is dry and hacking  No fever or chills  No nasal or sinus symptoms/ears ok, throat feels a little thick/irritated No heartburn  No urinary symptoms   If she exerts herself - feels a bit like she cannot get enough air   Had to leave the grocery store for instance- due to fatigue  Has to take a nap every day Pm sleep is spotty - depending on what stress she has   No exp to illness utd on imms  Had pneumonia last summer  Never smoked  No smoke exposure as a rule/ or other chemical exposure   Father died of lung cancer (smoker)   Wt Readings from Last 3 Encounters:  03/29/16 201 lb 4 oz (91.3 kg)  10/19/15 201 lb (91.2 kg)  04/08/15 201 lb 8 oz (91.4 kg)    BP Readings from Last 3 Encounters:  03/29/16 138/78  10/19/15 138/75  04/08/15 132/75     Has a full body PET scan upcoming in hte fall   Patient Active Problem List   Diagnosis Date Noted  . Cough 03/29/2016  . Fatigue 03/29/2016  . Hypokalemia 10/19/2015  . Osteopenia 10/19/2015  . Acute bronchitis 03/20/2015  . Estrogen deficiency 10/15/2014  . Encounter for Medicare annual wellness exam 10/15/2013  . Colon cancer screening 10/15/2013  . Right-sided chest wall pain 10/15/2013  . Leg weakness, bilateral 03/13/2013  . Laryngitis 08/16/2011  . Other screening mammogram 03/30/2011  . Gout 11/17/2010  . CHOLELITHIASIS, HX OF 11/17/2010  . HYPERCHOLESTEROLEMIA, PURE 05/17/2007  . Hyperglycemia 05/17/2007  . ALLERGIC  RHINITIS 01/23/2007  . Essential hypertension 12/20/2006  . [place] 12/20/2006  . URINARY INCONTINENCE, STRESS 12/20/2006   Past Medical History:  Diagnosis Date  . Allergic rhinitis   .  Hyperglycemia   . Hyperlipidemia   . Hypertension   . Melanoma ([place])   . Mixed incontinence   . Obesity   . Plantar fasciitis    with significant disability  . Tendonitis of foot    L, chronic foot pain   Past Surgical History:  Procedure Laterality Date  . ABDOMINAL HYSTERECTOMY     fibroids, ovaries intact  . BREAST BIOPSY Bilateral   . TENDON REPAIR  5/07   Tendon rupture L foot   Social History  Substance Use Topics  . Smoking status: Never Smoker  . Smokeless tobacco: Never Used  . Alcohol use No   Family History  Problem Relation Age of Onset  . Heart attack Mother   . Other Mother     ?thyroid problems  . Lung cancer Father    Allergies  Allergen Reactions  . Lipitor [Atorvastatin]     Muscle pain  . Naproxen Sodium    Current Outpatient Prescriptions on File Prior to Visit  Medication Sig Dispense Refill  . allopurinol (ZYLOPRIM) 300 MG tablet TAKE 1 TABLET BY MOUTH EVERY DAY 90 tablet 3  . aspirin 81 MG tablet Take 81 mg by mouth daily.      Marland Kitchen CALCIUM-VITAMIN D PO Take 1 tablet by mouth daily.      . colchicine 0.6 MG tablet as needed. Take 2  tablets now with food and then 1 tablet twice a day with food    . fluticasone (FLONASE) 50 MCG/ACT nasal spray PLACE 2 SPRAYS INTO BOTH NOSTRILS DAILY. 48 g 3  . guaiFENesin (MUCINEX) 600 MG 12 hr tablet Take 1,200 mg by mouth daily.    . hydrochlorothiazide (HYDRODIURIL) 25 MG tablet TAKE 1 TABLET BY MOUTH EVERY DAY 90 tablet 3  . KLOR-CON 10 10 MEQ tablet Take 1 tablet (10 mEq total) by mouth daily. (Patient taking differently: Take 5 mEq by mouth daily. ) 90 tablet 3  . meloxicam (MOBIC) 15 MG tablet TAKE 1 TABLET BY MOUTH DAILY. 90 tablet 3  . Multiple Vitamin (MULTIVITAMIN) tablet Take 2 tablets by mouth daily.     . simvastatin (ZOCOR) 20 MG tablet TAKE 1 TABLET BY MOUTH AT BEDTIME 90 tablet 1   No current facility-administered medications on file prior to visit.     Review of Systems Review of Systems    Constitutional: Negative for fever, appetite change,  and unexpected weight change.  Eyes: Negative for pain and visual disturbance.  Respiratory: Negative for wheeze and shortness of breath.  pos for cough and some chest wall pain on the R Cardiovascular: Negative for cp or palpitations    Gastrointestinal: Negative for nausea, diarrhea and constipation.  Genitourinary: Negative for urgency and frequency.  Skin: Negative for pallor or rash   Neurological: Negative for weakness, light-headedness, numbness and headaches.  Hematological: Negative for adenopathy. Does not bruise/bleed easily.  Psychiatric/Behavioral: Negative for dysphoric mood. The patient is not nervous/anxious.         Objective:   Physical Exam  Constitutional: She appears well-developed and well-nourished. No distress.  obese and well appearing   HENT:  Head: Normocephalic and atraumatic.  Mouth/Throat: Oropharynx is clear and moist.  Eyes: Conjunctivae and EOM are normal. Pupils are equal, round, and reactive to light.  Neck: Normal range of motion. Neck supple. No JVD present. Carotid bruit is not present. No thyromegaly present.  Cardiovascular: Normal rate, regular rhythm, normal heart sounds and intact distal pulses.  Exam reveals no gallop.   Pulmonary/Chest: Effort normal and breath sounds normal. No respiratory distress. She has no wheezes. She has no rales. She exhibits tenderness.  No crackles  Mild R lat cw tenderness w/o crepitus or skin change   Abdominal: Soft. Bowel sounds are normal. She exhibits no distension, no abdominal bruit and no mass. There is no hepatosplenomegaly. There is no tenderness. There is no CVA tenderness.  Musculoskeletal: She exhibits no edema.  Lymphadenopathy:    She has no cervical adenopathy.  Neurological: She is alert. She has normal reflexes. No cranial nerve deficit. She exhibits normal muscle tone. Coordination normal.  Skin: Skin is warm and dry. No rash noted.   Psychiatric: She has a normal mood and affect.          Assessment & Plan:   Problem List Items Addressed This Visit      Other   Fatigue    Along with some cough  No cardiac s/s  No wheeze on exam-reassuring  cxr and labs today  Plan from there       Relevant Orders   CBC with Differential/Platelet (Completed)   Comprehensive metabolic panel (Completed)   TSH (Completed)   Vitamin B12 (Completed)   Cough    CXR today -hx of pneumonia in the past Reassuring exam Also fatigue Lab today for fatigue  Plan from there  Other Visit Diagnoses   None.

## 2016-03-29 NOTE — Telephone Encounter (Signed)
She needs appt with first avail please -here or another [place] office please if agreeable

## 2016-03-30 LAB — COMPREHENSIVE METABOLIC PANEL
ALT: 18 U/L (ref 0–35)
AST: 19 U/L (ref 0–37)
Albumin: 4.3 g/dL (ref 3.5–5.2)
Alkaline Phosphatase: 52 U/L (ref 39–117)
BILIRUBIN TOTAL: 0.4 mg/dL (ref 0.2–1.2)
BUN: 27 mg/dL — ABNORMAL HIGH (ref 6–23)
CO2: 33 meq/L — AB (ref 19–32)
CREATININE: 1.23 mg/dL — AB (ref 0.40–1.20)
Calcium: 10.5 mg/dL (ref 8.4–10.5)
Chloride: 100 mEq/L (ref 96–112)
GFR: 45.67 mL/min — ABNORMAL LOW (ref >60.00)
GLUCOSE: 90 mg/dL (ref 70–99)
Potassium: 4.2 mEq/L (ref 3.5–5.1)
SODIUM: 140 meq/L (ref 135–145)
Total Protein: 7.2 g/dL (ref 6.0–8.3)

## 2016-03-30 LAB — CBC WITH DIFFERENTIAL/PLATELET
BASOS ABS: 0 10*3/uL (ref 0.0–0.1)
Basophils Relative: 0.4 % (ref 0.0–3.0)
EOS ABS: 0.4 10*3/uL (ref 0.0–0.7)
Eosinophils Relative: 4.1 % (ref 0.0–5.0)
HCT: 38.4 % (ref 36.0–46.0)
Hemoglobin: 12.8 g/dL (ref 12.0–15.0)
LYMPHS ABS: 1.8 10*3/uL (ref 0.7–4.0)
LYMPHS PCT: 19.8 % (ref 12.0–46.0)
MCHC: 33.2 g/dL (ref 30.0–36.0)
MCV: 93.1 fl (ref 78.0–100.0)
MONO ABS: 0.6 10*3/uL (ref 0.1–1.0)
Monocytes Relative: 6.7 % (ref 3.0–12.0)
NEUTROS ABS: 6.3 10*3/uL (ref 1.4–7.7)
NEUTROS PCT: 69 % (ref 43.0–77.0)
PLATELETS: 253 10*3/uL (ref 150.0–400.0)
RBC: 4.13 Mil/uL (ref 3.87–5.11)
RDW: 14.5 % (ref 11.5–15.5)
WBC: 9.1 10*3/uL (ref 4.0–10.5)

## 2016-03-30 LAB — VITAMIN B12: Vitamin B-12: >1500 pg/mL — ABNORMAL HIGH (ref 211–911)

## 2016-03-30 LAB — TSH: TSH: 2.66 u[IU]/mL (ref 0.35–4.50)

## 2016-03-31 NOTE — Assessment & Plan Note (Signed)
Along with some cough  No cardiac s/s  No wheeze on exam-reassuring  cxr and labs today  Plan from there

## 2016-03-31 NOTE — Assessment & Plan Note (Signed)
CXR today -hx of pneumonia in the past Reassuring exam Also fatigue Lab today for fatigue  Plan from there

## 2016-04-01 ENCOUNTER — Telehealth: Payer: Self-pay | Admitting: Family Medicine

## 2016-04-01 MED ORDER — BENZONATATE 200 MG PO CAPS: 200.0000 mg | ORAL_CAPSULE | Freq: Three times a day (TID) | ORAL | 1 refills | Status: DC | PRN

## 2016-04-01 NOTE — Telephone Encounter (Signed)
-----   Message from Tammi Sou, [place] sent at 04/01/2016 11:36 AM EDT ----- Pt notified of lab results and Dr. Marliss Coots comments. Pt doesn't have any sxs of UTI so she will increase her water intake.  Pt said overall she feels "ok" but she has developed a cough. The cough is worse then when she was here in the office. It's pretty bad and she keeps having coughing spells and it's worse at night and keeping her up. Pt said that last time she had a bad cough like this you prescribed her tessalon caps and it helped a lot. [facility] [address]

## 2016-04-01 NOTE — Telephone Encounter (Signed)
I sent the tessalon Drink more water Check bmp in 1-2 weeks for elevated cr Keep me posted

## 2016-04-11 ENCOUNTER — Other Ambulatory Visit: Payer: Self-pay

## 2016-04-11 MED ORDER — OXYBUTYNIN CHLORIDE ER 10 MG PO TB24: 10.0000 mg | ORAL_TABLET | Freq: Every day | ORAL | 0 refills | Status: DC

## 2016-04-11 MED ORDER — VALSARTAN-HYDROCHLOROTHIAZIDE 160-12.5 MG PO TABS: 1.0000 | ORAL_TABLET | Freq: Every day | ORAL | 0 refills | Status: DC

## 2016-04-11 NOTE — Telephone Encounter (Signed)
[facility] [address] said pt there on vacation and forgot med request 1 week of oxybutynin and valsartan HCTZ. Done.

## 2016-04-29 ENCOUNTER — Telehealth: Payer: Self-pay

## 2016-04-29 MED ORDER — BENZONATATE 200 MG PO CAPS: 200.0000 mg | ORAL_CAPSULE | Freq: Three times a day (TID) | ORAL | 2 refills | Status: DC | PRN

## 2016-04-29 NOTE — Telephone Encounter (Signed)
Please refill it times 2 If cough worsens or does not improve in the next 2 weeks-follow up

## 2016-04-29 NOTE — Telephone Encounter (Signed)
Pt notified Rx sent to pharmacy and advise of Dr. Marliss Coots comments and instructions and verbalized understanding

## 2016-04-29 NOTE — Telephone Encounter (Signed)
Pt left v/m; pt was seen 03/29/16 and continues to have cough; pt has gotten the refill on benzonatate; pt wants to know if can get benzonatate refill or does pt need to come in for appt. Pt [facility] cb[facility].

## 2016-05-04 ENCOUNTER — Ambulatory Visit (INDEPENDENT_AMBULATORY_CARE_PROVIDER_SITE_OTHER): Payer: Medicare Other | Admitting: Family Medicine

## 2016-05-04 ENCOUNTER — Encounter: Payer: Self-pay | Admitting: Family Medicine

## 2016-05-04 VITALS — BP 128/72 | Temp 98.0°F | Wt 201.8 lb

## 2016-05-04 DIAGNOSIS — R748 Abnormal levels of other serum enzymes: Secondary | ICD-10-CM

## 2016-05-04 DIAGNOSIS — R7989 Other specified abnormal findings of blood chemistry: Secondary | ICD-10-CM

## 2016-05-04 DIAGNOSIS — R5382 Chronic fatigue, unspecified: Secondary | ICD-10-CM

## 2016-05-04 DIAGNOSIS — R059 Cough, unspecified: Secondary | ICD-10-CM

## 2016-05-04 DIAGNOSIS — R05 Cough: Secondary | ICD-10-CM | POA: Diagnosis not present

## 2016-05-04 LAB — BASIC METABOLIC PANEL
BUN: 24 mg/dL — ABNORMAL HIGH (ref 6–23)
CO2: 34 meq/L — AB (ref 19–32)
Calcium: 10.1 mg/dL (ref 8.4–10.5)
Chloride: 101 mEq/L (ref 96–112)
Creatinine, Ser: 1.08 mg/dL (ref 0.40–1.20)
GFR: 53.05 mL/min — ABNORMAL LOW (ref >60.00)
GLUCOSE: 145 mg/dL — AB (ref 70–99)
POTASSIUM: 4.1 meq/L (ref 3.5–5.1)
SODIUM: 141 meq/L (ref 135–145)

## 2016-05-04 MED ORDER — ALBUTEROL SULFATE (2.5 MG/3ML) 0.083% IN NEBU: 2.5000 mg | INHALATION_SOLUTION | Freq: Once | RESPIRATORY_TRACT | Status: DC

## 2016-05-04 MED ORDER — ALBUTEROL SULFATE HFA 108 (90 BASE) MCG/ACT IN AERS: 2.0000 | INHALATION_SPRAY | RESPIRATORY_TRACT | 1 refills | Status: DC | PRN

## 2016-05-04 MED ORDER — IPRATROPIUM BROMIDE 0.02 % IN SOLN: 0.5000 mg | Freq: Once | RESPIRATORY_TRACT | Status: DC

## 2016-05-04 MED ORDER — HYDROCOD POLST-CPM POLST ER 10-8 MG/5ML PO SUER: 5.0000 mL | Freq: Two times a day (BID) | ORAL | 0 refills | Status: DC | PRN

## 2016-05-04 NOTE — Patient Instructions (Signed)
Please use albuterol inhaler every 2-4 hours while you are awake for the next two days and then as need.  Let us know if you are not better in two weeks.   Cough, Adult Coughing is a reflex that clears your throat and your airways. Coughing helps to heal and protect your lungs. It is normal to cough occasionally, but a cough that happens with other symptoms or lasts a long time may be a sign of a condition that needs treatment. A cough may last only 2-3 weeks (acute), or it may last longer than 8 weeks (chronic). CAUSES Coughing is commonly caused by:  Breathing in substances that irritate your lungs.  A viral or bacterial respiratory infection.  Allergies.  Asthma.  Postnasal drip.  Smoking.  Acid backing up from the stomach into the esophagus (gastroesophageal reflux).  Certain medicines.  Chronic lung problems, including COPD (or rarely, lung cancer).  Other medical conditions such as heart failure. HOME CARE INSTRUCTIONS  Pay attention to any changes in your symptoms. Take these actions to help with your discomfort:  Take medicines only as told by your health care provider.  If you were prescribed an antibiotic medicine, take it as told by your health care provider. Do not stop taking the antibiotic even if you start to feel better.  Talk with your health care provider before you take a cough suppressant medicine.  Drink enough fluid to keep your urine clear or pale yellow.  If the air is dry, use a cold steam vaporizer or humidifier in your bedroom or your home to help loosen secretions.  Avoid anything that causes you to cough at work or at home.  If your cough is worse at night, try sleeping in a semi-upright position.  Avoid cigarette smoke. If you smoke, quit smoking. If you need help quitting, ask your health care provider.  Avoid caffeine.  Avoid alcohol.  Rest as needed. SEEK MEDICAL CARE IF:   You have new symptoms.  You cough up pus.  Your cough  does not get better after 2-3 weeks, or your cough gets worse.  You cannot control your cough with suppressant medicines and you are losing sleep.  You develop pain that is getting worse or pain that is not controlled with pain medicines.  You have a fever.  You have unexplained weight loss.  You have night sweats. SEEK IMMEDIATE MEDICAL CARE IF:  You cough up blood.  You have difficulty breathing.  Your heartbeat is very fast.   This information is not intended to replace advice given to you by your health care provider. Make sure you discuss any questions you have with your health care provider.   Document Released: 02/18/2011 Document Revised: 05/13/2015 Document Reviewed: 10/29/2014 Elsevier Interactive Patient Education [employer].

## 2016-05-04 NOTE — Progress Notes (Signed)
Subjective:    Patient ID: Andrea Peters, female    DOB: 1946/08/23, 70 y.o.   MRN: [id number]  HPI This is a 70 yo female who presents today with nonproductive cough x 2 months. She was seen 03/29/16 by Dr. Glori Bickers and had negative CXR, negative CBC, slightly increased bun/cr. She has been taking tessalon perles which helps for about 2 hours. No wheezing. No fever, feels warm and flushed in the mornings. Some coughing when she lies down and coughs periodically through the night. In the beginning, cough felt deep, but now feels like it is more upper anterior. Once she starts coughing, difficult to stop. Has also used cough lozenges. She recalls that last year in July she was spending a lot of time in her basement prior to getting pneumonia and again this July she spent a lot of time in her basement. Has year round allergy symptoms and takes flonase daily. Rarely has acid reflux symptoms.  Was also seen for complaint of fatigue which she though improved some until recently. She sleeps about 7 hours a night and feels somewhat rested in the mornings when she awakes. Her daughter is getting married in a couple of weeks and she would like to have cough resolved. She doesn't feel like she is overdoing things but her family tells her she is.   Past Medical History:  Diagnosis Date  . Allergic rhinitis   . Hyperglycemia   . Hyperlipidemia   . Hypertension   . Melanoma ([place])   . Mixed incontinence   . Obesity   . Plantar fasciitis    with significant disability  . Tendonitis of foot    L, chronic foot pain   Past Surgical History:  Procedure Laterality Date  . ABDOMINAL HYSTERECTOMY     fibroids, ovaries intact  . BREAST BIOPSY Bilateral   . TENDON REPAIR  5/07   Tendon rupture L foot   Family History  Problem Relation Age of Onset  . Heart attack Mother   . Other Mother     ?thyroid problems  . Lung cancer Father    Social History  Substance Use Topics  . Smoking status: Never  Smoker  . Smokeless tobacco: Never Used  . Alcohol use No      Review of Systems Per HPI    Objective:   Physical Exam  Constitutional: She is oriented to person, place, and time. She appears well-developed and well-nourished. No distress.  Obese.   HENT:  Head: Normocephalic and atraumatic.  Mouth/Throat: No oropharyngeal exudate.  Neck: Normal range of motion. Neck supple.  Cardiovascular: Normal rate, regular rhythm and normal heart sounds.   Pulmonary/Chest: Effort normal and breath sounds normal. No respiratory distress. She has no wheezes. She has no rales.  Frequent, dry cough.   Musculoskeletal: Edema: trace pretibial.  Lymphadenopathy:    She has no cervical adenopathy.  Neurological: She is alert and oriented to person, place, and time.  Skin: Skin is warm and dry. She is not diaphoretic.  Psychiatric: She has a normal mood and affect. Her behavior is normal. Judgment and thought content normal.  Vitals reviewed.   Given atrovent/albuerol neb in office with subjective improvement and decreased cough  BP 128/72   Temp 98 F (36.7 C)   Wt 201 lb 12.8 oz (91.5 kg)   BMI 37.21 kg/m      Assessment & Plan:  1. Cough - ipratropium (ATROVENT) nebulizer solution 0.5 mg; Take 2.5 mLs (0.5 mg total)  by nebulization once. - albuterol (PROVENTIL) (2.5 MG/3ML) 0.083% nebulizer solution 2.5 mg; Take 3 mLs (2.5 mg total) by nebulization once. - albuterol (PROVENTIL HFA;VENTOLIN HFA) 108 (90 Base) MCG/ACT inhaler; Inhale 2 puffs into the lungs every 4 (four) hours as needed for wheezing or shortness of breath (cough, shortness of breath or wheezing.).  Dispense: 1 Inhaler; Refill: 1 - chlorpheniramine-HYDROcodone (TUSSIONEX PENNKINETIC ER) 10-8 MG/5ML SUER; Take 5 mLs by mouth every 12 (twelve) hours as needed for cough.  Dispense: 115 mL; Refill: 0 - suspect post viral reactive airway with cyclical cough. Discussed with patient. Will try albuterol, cough suppression. If no  improvement, will refer to pulmonary.   2. Chronic fatigue - CBC, TSH looked good from last month. Per her family, she is overdoing activity. Her sleep has certainly been disrupted by night time cough.   3. Elevated serum creatinine - Basic metabolic panel   Clarene Reamer, FNP-BC  [facility], [facility]  05/04/2016 1:13 PM

## 2016-05-18 ENCOUNTER — Telehealth: Payer: Self-pay

## 2016-05-18 DIAGNOSIS — R059 Cough, unspecified: Secondary | ICD-10-CM

## 2016-05-18 DIAGNOSIS — R05 Cough: Secondary | ICD-10-CM

## 2016-05-18 MED ORDER — BENZONATATE 200 MG PO CAPS: 200.0000 mg | ORAL_CAPSULE | Freq: Three times a day (TID) | ORAL | 2 refills | Status: DC | PRN

## 2016-05-18 NOTE — Telephone Encounter (Signed)
Spoke with patient. She said she still has the cough on occasion, but when she has it, she can't stop. She is agreeable to pulm referral.   She also asks for something to take to help with cough. Her granddaughter is getting married next weekend and it is outside. She said she won't even be able to attend the wedding if she has a coughing fit. The Tussinex knocked her out and she hasn't tried OTC meds.

## 2016-05-18 NOTE — Telephone Encounter (Signed)
Pt left v/m; pt last seen 05/04/16;pt has been seen multiple times for cough and congestion and general exhaustion; pt thinks she needs to be seen and at 05/04/16 was noted if no improvement to do pulmonary referral. Pt request cb.

## 2016-05-18 NOTE — Telephone Encounter (Signed)
I sent tessalon -non sedating Also she can get robitussin DM otc and use this along with it as needed   I will do the referral and route to [facility]

## 2016-05-18 NOTE — Telephone Encounter (Signed)
Please ask how her cough and respiratory symptoms are.  I do want to refer her to pulmonary if she is agreeable

## 2016-05-18 NOTE — Telephone Encounter (Signed)
Patient notified

## 2016-05-19 ENCOUNTER — Ambulatory Visit (INDEPENDENT_AMBULATORY_CARE_PROVIDER_SITE_OTHER): Payer: Medicare Other | Admitting: Internal Medicine

## 2016-05-19 ENCOUNTER — Encounter: Payer: Self-pay | Admitting: Internal Medicine

## 2016-05-19 VITALS — BP 152/88 | HR 62 | Ht 62.0 in | Wt 200.0 lb

## 2016-05-19 DIAGNOSIS — R05 Cough: Secondary | ICD-10-CM | POA: Diagnosis not present

## 2016-05-19 DIAGNOSIS — R059 Cough, unspecified: Secondary | ICD-10-CM

## 2016-05-19 MED ORDER — FLUTICASONE FUROATE-VILANTEROL 200-25 MCG/INH IN AEPB: 1.0000 | INHALATION_SPRAY | Freq: Every day | RESPIRATORY_TRACT | 0 refills | Status: AC

## 2016-05-19 MED ORDER — FLUTICASONE FUROATE-VILANTEROL 200-25 MCG/INH IN AEPB: 1.0000 | INHALATION_SPRAY | Freq: Every day | RESPIRATORY_TRACT | 2 refills | Status: DC

## 2016-05-19 NOTE — Telephone Encounter (Signed)
Pulmonary Appt made and patient aware.

## 2016-05-19 NOTE — Progress Notes (Signed)
[facility] Consultation      Assessment and Plan:  Patient is 70 year old female with cough for nearly 3 months duration.  Asthmatic bronchitis -Acute asthmatic bronchitis, likely post infectious cough. -Patient was given reassurance that this appears to be improving slowly, will continue to wait another 4-6 weeks to see if it resolves on its own. -In the meantime, we will continue to treat symptomatically with Tessalon 3 times daily, Robitussin when necessary, she is given a prescription for Pyridium to be used once daily, and asked to continue using her rescue inhaler as needed  Chronic cough.  -As above. We'll continue to treat this chronic cough symptomatically. -Patient had a slight tenting of the diaphragm seen on chest x-ray, if her cough persists, we'll consider further workup with CT scanning of the lungs.  Date: 05/19/2016  MRN# [id number] Andrea Peters 1945/12/03  Referring Physician:   DEEANNE DEININGER is a 70 y.o. old female seen in consultation for chief complaint of:    Chief Complaint  Patient presents with  . Advice Only    per Dr. Glori Bickers; NP cough; nausea off and on x 1 mth; feels she needs to take deep breath;      HPI:   She has a cough which started in June of 2017, it came on with problems when she was tired and "lazy" all the time. The cough came on then. She was started on tessalon, which helped, but the cough did not go away. She was coughing so hard that she lost bladder control.   She tried on hydrocodone which knocked her out. The cough has gone up and down, she may go for a day without a cough, then will get a fit of cough. She currently uses proventil bid, and feels that it helps. She was tried on what sounds like a prednisone taper, and made her feel better, but not sure that it helped the cough.   She has not tried OTC robitussin, she continue on tessalon 200 twice to three times daily.  She is not a smoker, her husband  smoked, quit 30 yrs ago.  She has not pets at home. She has never been tested for allergies, she has occasional problems with vertigo, she takes mucinex and flonase which helps prevent the attacks.  The cough used to wake her from sleep, not as much as it used to.   She denies reflux.   CXr from 03/29/16 reviewed; slight tenting at right base, possible atelectasis.   PMHX:   Past Medical History:  Diagnosis Date  . Allergic rhinitis   . Hyperglycemia   . Hyperlipidemia   . Hypertension   . Melanoma ([place])   . Mixed incontinence   . Obesity   . Plantar fasciitis    with significant disability  . Tendonitis of foot    L, chronic foot pain   Surgical Hx:  Past Surgical History:  Procedure Laterality Date  . ABDOMINAL HYSTERECTOMY     fibroids, ovaries intact  . BREAST BIOPSY Bilateral   . TENDON REPAIR  5/07   Tendon rupture L foot   Family Hx:  Family History  Problem Relation Age of Onset  . Heart attack Mother   . Other Mother     ?thyroid problems  . Lung cancer Father    Social Hx:   Social History  Substance Use Topics  . Smoking status: Never Smoker  . Smokeless tobacco: Never Used  . Alcohol use No  Medication:   Reviewed    Allergies:  Lipitor [atorvastatin]; Naproxen sodium; and Naproxen  Review of Systems: Gen:  Denies  fever, sweats, chills HEENT: Denies blurred vision, double vision. bleeds, sore throat Cvc:  No dizziness, chest pain. Resp:   Denies shortness of breath Gi: Denies swallowing difficulty, stomach pain. Gu:  Denies bladder incontinence, burning urine Ext:   No Joint pain, stiffness. Skin: No skin rash,  hives  Endoc:  No polyuria, polydipsia. Psych: No depression, insomnia. Other:  All other systems were reviewed with the patient and were negative other that what is mentioned in the HPI.   Physical Examination:   VS: BP (!) 152/88 (BP Location: Left Arm, Cuff Size: Normal)   Pulse 62   Ht '5\' 2"'$  (1.575 m)   Wt 200 lb (90.7  kg)   SpO2 97%   BMI 36.58 kg/m   General Appearance: No distress  Neuro:without focal findings,  speech normal,  HEENT: PERRLA, EOM intact.   Pulmonary: normal breath sounds, No wheezing.  CardiovascularNormal S1,S2.  No m/r/g.   Abdomen: Benign, Soft, non-tender.Right upper quadrant scar Renal:  No costovertebral tenderness  GU:  No performed at this time. Endoc: No evident thyromegaly, no signs of acromegaly. Skin:   warm, no rashes, no ecchymosis  Extremities: normal, no cyanosis, clubbing.  Other findings:    LABORATORY PANEL:   CBC No results for input(s): WBC, HGB, HCT, PLT in the last 168 hours. ------------------------------------------------------------------------------------------------------------------  Chemistries  No results for input(s): NA, K, CL, CO2, GLUCOSE, BUN, CREATININE, CALCIUM, MG, AST, ALT, ALKPHOS, BILITOT in the last 168 hours.  Invalid input(s): GFRCGP ------------------------------------------------------------------------------------------------------------------  Cardiac Enzymes No results for input(s): TROPONINI in the last 168 hours. ------------------------------------------------------------  RADIOLOGY:  No results found.     Thank  you for the consultation and for allowing [facility] to assist in the care of your patient. Our recommendations are noted above.  Please contact us if we can be of further service.   Marda Stalker, MD.  Board Certified in Internal Medicine, Pulmonary Medicine, [place], and Sleep Medicine.  [facility] Office Number: [phone]  Patricia Pesa, M.D.  Vilinda Boehringer, M.D.  Merton Border, M.D  05/19/2016

## 2016-05-19 NOTE — Patient Instructions (Addendum)
--  Increase tessalon to three times daily.   --Start robitussin-DM over the counter.   -Continue flonase.   -Continue proair, will add Breo 200 for one month.   -Will make appointment for follow up in 4-6 weeks. If you are better by then you can cancel the appointment.

## 2016-05-29 ENCOUNTER — Other Ambulatory Visit: Payer: Self-pay | Admitting: Family Medicine

## 2016-05-31 ENCOUNTER — Telehealth: Payer: Self-pay | Admitting: Internal Medicine

## 2016-05-31 NOTE — Telephone Encounter (Signed)
Spoke with pt who states her symptoms have improved with breo. Pt would like to know if she should continue this medication or is it okay to stop  DR please advise. Thanks.

## 2016-05-31 NOTE — Telephone Encounter (Signed)
Pt calling stating she is doing a lot better now that she took some of the Brio  Would like to know if she can stop taking it Please advise.

## 2016-06-01 NOTE — Telephone Encounter (Signed)
Can stop. Restart if cough returns.

## 2016-06-02 NOTE — Telephone Encounter (Signed)
Pt informed and states she will restart in cough starts again. Nothing further needed.

## 2016-06-08 DIAGNOSIS — C4361 Malignant melanoma of right upper limb, including shoulder: Secondary | ICD-10-CM | POA: Diagnosis not present

## 2016-06-08 DIAGNOSIS — R935 Abnormal findings on diagnostic imaging of other abdominal regions, including retroperitoneum: Secondary | ICD-10-CM | POA: Diagnosis not present

## 2016-06-08 DIAGNOSIS — R918 Other nonspecific abnormal finding of lung field: Secondary | ICD-10-CM | POA: Diagnosis not present

## 2016-06-13 ENCOUNTER — Telehealth: Payer: Self-pay | Admitting: Internal Medicine

## 2016-06-13 MED ORDER — FLUTICASONE FUROATE-VILANTEROL 200-25 MCG/INH IN AEPB: 1.0000 | INHALATION_SPRAY | Freq: Every day | RESPIRATORY_TRACT | 3 refills | Status: DC

## 2016-06-13 NOTE — Telephone Encounter (Signed)
Pt calling to change pharmacy    *STAT* If patient is at the pharmacy, call can be transferred to refill team.   1. Which medications need to be refilled? (please list name of each medication and dose if known) Breo   2. Which pharmacy/location (including street and city if local pharmacy) [facility]   3. Do they need a 30 day or 90 day supply? 90 day   Pt states she is going out of town and would like this to be called in today

## 2016-06-14 DIAGNOSIS — C439 Malignant melanoma of skin, unspecified: Secondary | ICD-10-CM | POA: Diagnosis not present

## 2016-07-08 ENCOUNTER — Ambulatory Visit: Payer: Medicare Other | Admitting: Internal Medicine

## 2016-08-09 ENCOUNTER — Encounter: Payer: Self-pay | Admitting: Family Medicine

## 2016-08-09 ENCOUNTER — Ambulatory Visit (INDEPENDENT_AMBULATORY_CARE_PROVIDER_SITE_OTHER): Payer: Medicare Other | Admitting: Family Medicine

## 2016-08-09 VITALS — BP 130/80 | HR 64 | Temp 98.5°F | Ht 61.75 in | Wt 200.2 lb

## 2016-08-09 DIAGNOSIS — J209 Acute bronchitis, unspecified: Secondary | ICD-10-CM | POA: Insufficient documentation

## 2016-08-09 DIAGNOSIS — Z23 Encounter for immunization: Secondary | ICD-10-CM | POA: Diagnosis not present

## 2016-08-09 DIAGNOSIS — I10 Essential (primary) hypertension: Secondary | ICD-10-CM | POA: Diagnosis not present

## 2016-08-09 MED ORDER — PREDNISONE 10 MG PO TABS: ORAL_TABLET | ORAL | 0 refills | Status: DC

## 2016-08-09 MED ORDER — AZITHROMYCIN 250 MG PO TABS: ORAL_TABLET | ORAL | 0 refills | Status: DC

## 2016-08-09 MED ORDER — BENZONATATE 200 MG PO CAPS: 200.0000 mg | ORAL_CAPSULE | Freq: Three times a day (TID) | ORAL | 2 refills | Status: DC | PRN

## 2016-08-09 MED ORDER — HYDROCOD POLST-CPM POLST ER 10-8 MG/5ML PO SUER: 5.0000 mL | Freq: Two times a day (BID) | ORAL | 0 refills | Status: DC | PRN

## 2016-08-09 NOTE — Assessment & Plan Note (Addendum)
Andrea Peters with hx of asthma  Re assuring exam - pt is worried that wheezing will worsen Px tussionex (warning of sedation and habit) Tessalon Albuterol rescue inhaler-reminded that she can take this up to every 4 hours Continue Breo  Take zpak as directed (in light of complex pulm status)  Given px for prednisone only to fill if no imp with albuterol or wheezing worsens (pt voiced understanding) and side eff were discussed  Disc symptomatic care - see instructions on AVS  Update if not starting to improve in a week or if worsening

## 2016-08-09 NOTE — Progress Notes (Signed)
Subjective:    Patient ID: Andrea Peters, female    DOB: 1946/06/21, 70 y.o.   MRN: [id number]  HPI Here for a cough   Uri symptoms started Friday with a ST and then cough and chest congestion  Worse on sat  ST is a little improved  Some L sided chest wall pain /discomfort  Hoarse voice Cough started junky- now more dry  Did not bring anything up  No nasal d/c  No ear pain  No fever  Some ha from coughing  Just a little wheezing  Past hx of pneumonia   Taking tessalon for cough  Did not have any tussionex   On breo/ellipta inhaler Helps quite a bit -helped her get back to normal  Dx with asthma by pulmonary  Not using her rescue inhaler    Has not had a flu shot this season  Needs it today if possible     Wt Readings from Last 3 Encounters:  08/09/16 200 lb 4 oz (90.8 kg)  05/19/16 200 lb (90.7 kg)  05/04/16 201 lb 12.8 oz (91.5 kg)    Last cxr 7/17 showed interval clearing of previous pneumonia    bp is stable today  No cp or palpitations or headaches or edema  No side effects to medicines  BP Readings from Last 3 Encounters:  08/09/16 130/80  05/19/16 (!) 152/88  05/04/16 128/72      Patient Active Problem List   Diagnosis Date Noted  . Acute bronchitis with bronchospasm 08/09/2016  . Cough 03/29/2016  . Fatigue 03/29/2016  . Hypokalemia 10/19/2015  . Osteopenia 10/19/2015  . Estrogen deficiency 10/15/2014  . Encounter for Medicare annual wellness exam 10/15/2013  . Colon cancer screening 10/15/2013  . Right-sided chest wall pain 10/15/2013  . Leg weakness, bilateral 03/13/2013  . Laryngitis 08/16/2011  . Other screening mammogram 03/30/2011  . Gout 11/17/2010  . CHOLELITHIASIS, HX OF 11/17/2010  . HYPERCHOLESTEROLEMIA, PURE 05/17/2007  . Hyperglycemia 05/17/2007  . ALLERGIC  RHINITIS 01/23/2007  . Essential hypertension 12/20/2006  . [place], [place] 12/20/2006  . URINARY INCONTINENCE, STRESS 12/20/2006   Past Medical History:    Diagnosis Date  . Allergic rhinitis   . Hyperglycemia   . Hyperlipidemia   . Hypertension   . Melanoma ([place])   . Mixed incontinence   . Obesity   . Plantar fasciitis    with significant disability  . Tendonitis of foot    L, chronic foot pain   Past Surgical History:  Procedure Laterality Date  . ABDOMINAL HYSTERECTOMY     fibroids, ovaries intact  . BREAST BIOPSY Bilateral   . TENDON REPAIR  5/07   Tendon rupture L foot   Social History  Substance Use Topics  . Smoking status: Never Smoker  . Smokeless tobacco: Never Used  . Alcohol use No   Family History  Problem Relation Age of Onset  . Heart attack Mother   . Other Mother     ?thyroid problems  . Lung cancer Father    Allergies  Allergen Reactions  . Lipitor [Atorvastatin]     Muscle pain  . Naproxen Sodium   . Naproxen Other (See Comments) and Rash    Severe indigestion   Current Outpatient Prescriptions on File Prior to Visit  Medication Sig Dispense Refill  . albuterol (PROVENTIL HFA;VENTOLIN HFA) 108 (90 Base) MCG/ACT inhaler Inhale 2 puffs into the lungs every 4 (four) hours as needed for wheezing or shortness of breath (cough, shortness  of breath or wheezing.). 1 Inhaler 1  . allopurinol (ZYLOPRIM) 300 MG tablet TAKE 1 TABLET BY MOUTH EVERY DAY 90 tablet 3  . aspirin 81 MG tablet Take 81 mg by mouth daily.      Marland Kitchen CALCIUM-VITAMIN D PO Take 1 tablet by mouth daily.      . colchicine 0.6 MG tablet as needed. Take 2  tablets now with food and then 1 tablet twice a day with food    . fluticasone (FLONASE) 50 MCG/ACT nasal spray PLACE 2 SPRAYS INTO BOTH NOSTRILS DAILY. 48 g 3  . fluticasone furoate-vilanterol (BREO ELLIPTA) 200-25 MCG/INH AEPB Inhale 1 puff into the lungs daily. 120 each 3  . guaiFENesin (MUCINEX) 600 MG 12 hr tablet Take 1,200 mg by mouth daily.    . hydrochlorothiazide (HYDRODIURIL) 25 MG tablet TAKE 1 TABLET BY MOUTH EVERY DAY 90 tablet 3  . KLOR-CON 10 10 MEQ tablet Take 1 tablet (10  mEq total) by mouth daily. (Patient taking differently: Take 5 mEq by mouth daily. ) 90 tablet 3  . meloxicam (MOBIC) 15 MG tablet TAKE 1 TABLET BY MOUTH DAILY. 90 tablet 3  . Multiple Vitamin (MULTIVITAMIN) tablet Take 2 tablets by mouth daily.     Marland Kitchen oxybutynin (DITROPAN-XL) 10 MG 24 hr tablet Take 1 tablet (10 mg total) by mouth at bedtime. 7 tablet 0  . simvastatin (ZOCOR) 20 MG tablet TAKE 1 TABLET BY MOUTH AT BEDTIME 90 tablet 1  . valsartan-hydrochlorothiazide (DIOVAN-HCT) 160-12.5 MG tablet Take 1 tablet by mouth daily. 7 tablet 0   No current facility-administered medications on file prior to visit.     Review of Systems Review of Systems  Constitutional: Negative for fever, appetite change,  and unexpected weight change. pos for fatigue ENT pos for hoarseness and lung discomfort , pos for pnd, neg for sinus pain or st Eyes: Negative for pain and visual disturbance.  Respiratory: Negative for  shortness of breath.  pos for cough and wheeze Cardiovascular: Negative for cp or palpitations    Gastrointestinal: Negative for nausea, diarrhea and constipation.  Genitourinary: Negative for urgency and frequency.  Skin: Negative for pallor or rash   Neurological: Negative for weakness, light-headedness, numbness and headaches.  Hematological: Negative for adenopathy. Does not bruise/bleed easily.  Psychiatric/Behavioral: Negative for dysphoric mood. The patient is not nervous/anxious.         Objective:   Physical Exam  Constitutional: She appears well-developed and well-nourished. No distress.  HENT:  Head: Normocephalic and atraumatic.  Right Ear: External ear normal.  Left Ear: External ear normal.  Mouth/Throat: Oropharynx is clear and moist.  Nares are injected and congested  No sinus tenderness Clear rhinorrhea and post nasal drip  Throat is clear  Eyes: Conjunctivae and EOM are normal. Pupils are equal, round, and reactive to light. Right eye exhibits no discharge. Left  eye exhibits no discharge.  Neck: Normal range of motion. Neck supple.  Cardiovascular: Normal rate and normal heart sounds.   Pulmonary/Chest: Effort normal. No respiratory distress. She has wheezes. She has no rales. She exhibits no tenderness.  Harsh bs with occ rhonchi Wheeze only on forced expiration  No prolonged exp phase  No rales   Lymphadenopathy:    She has no cervical adenopathy.  Neurological: She is alert.  Skin: Skin is warm and dry. No rash noted.  Psychiatric: She has a normal mood and affect.          Assessment & Plan:   Problem  List Items Addressed This Visit      Cardiovascular and Mediastinum   Essential hypertension - Primary    bp in fair control at this time  BP Readings from Last 1 Encounters:  08/09/16 130/80   No changes needed Disc lifstyle change with low sodium diet and exercise          Respiratory   Acute bronchitis with bronchospasm    Samuel [place] with hx of asthma  Re assuring exam - pt is worried that wheezing will worsen Px tussionex (warning of sedation and habit) Tessalon Albuterol rescue inhaler-reminded that she can take this up to every 4 hours Continue Breo  Take zpak as directed (in light of complex pulm status)  Given px for prednisone only to fill if no imp with albuterol or wheezing worsens (pt voiced understanding) and side eff were discussed  Disc symptomatic care - see instructions on AVS  Update if not starting to improve in a week or if worsening         Other Visit Diagnoses    Need for influenza vaccination       Relevant Orders   Flu Vaccine QUAD 36+ mos IM (Completed)

## 2016-08-09 NOTE — Progress Notes (Signed)
Pre visit review using our clinic review tool, if applicable. No additional management support is needed unless otherwise documented below in the visit note. 

## 2016-08-09 NOTE — Assessment & Plan Note (Signed)
bp in fair control at this time  BP Readings from Last 1 Encounters:  08/09/16 130/80   No changes needed Disc lifstyle change with low sodium diet and exercise

## 2016-08-09 NOTE — Patient Instructions (Addendum)
Flu shot today I think you have bronchitis with reactive airways  Take the zpak  Use your albuterol inhaler 2 puffs up to every 4 hours as needed  tussionex for cough with caution of sedation  Tessalon pearles as needed  If wheezing worsens-fill px for prednisone   Update if not starting to improve in a week or if worsening

## 2016-10-04 ENCOUNTER — Other Ambulatory Visit: Payer: Self-pay | Admitting: Family Medicine

## 2016-10-04 NOTE — Telephone Encounter (Signed)
Pt has CPE scheduled for 10/21/16, please advise

## 2016-10-04 NOTE — Telephone Encounter (Signed)
Refill each times one please

## 2016-10-05 ENCOUNTER — Other Ambulatory Visit: Payer: Self-pay | Admitting: Family Medicine

## 2016-10-08 ENCOUNTER — Telehealth: Payer: Self-pay | Admitting: Family Medicine

## 2016-10-08 DIAGNOSIS — I10 Essential (primary) hypertension: Secondary | ICD-10-CM

## 2016-10-08 DIAGNOSIS — R739 Hyperglycemia, unspecified: Secondary | ICD-10-CM

## 2016-10-08 DIAGNOSIS — M1A9XX0 Chronic gout, unspecified, without tophus (tophi): Secondary | ICD-10-CM

## 2016-10-08 DIAGNOSIS — E7800 Pure hypercholesterolemia, unspecified: Secondary | ICD-10-CM

## 2016-10-08 NOTE — Telephone Encounter (Signed)
-----   Message from Ellamae Sia sent at 10/04/2016  2:47 PM EST ----- Regarding: Lab orders for Monday, 2.12.18 Patient is scheduled for CPX labs, please order future labs, Thanks , Karna Christmas

## 2016-10-17 ENCOUNTER — Ambulatory Visit (INDEPENDENT_AMBULATORY_CARE_PROVIDER_SITE_OTHER): Payer: Medicare Other

## 2016-10-17 ENCOUNTER — Other Ambulatory Visit: Payer: Medicare Other

## 2016-10-17 VITALS — BP 145/88 | HR 70 | Temp 97.9°F | Ht 61.0 in | Wt 202.5 lb

## 2016-10-17 DIAGNOSIS — E7800 Pure hypercholesterolemia, unspecified: Secondary | ICD-10-CM | POA: Diagnosis not present

## 2016-10-17 DIAGNOSIS — Z1159 Encounter for screening for other viral diseases: Secondary | ICD-10-CM

## 2016-10-17 DIAGNOSIS — Z0000 Encounter for general adult medical examination without abnormal findings: Secondary | ICD-10-CM | POA: Diagnosis not present

## 2016-10-17 DIAGNOSIS — R739 Hyperglycemia, unspecified: Secondary | ICD-10-CM | POA: Diagnosis not present

## 2016-10-17 DIAGNOSIS — I10 Essential (primary) hypertension: Secondary | ICD-10-CM

## 2016-10-17 DIAGNOSIS — M1A9XX0 Chronic gout, unspecified, without tophus (tophi): Secondary | ICD-10-CM | POA: Diagnosis not present

## 2016-10-17 LAB — CBC WITH DIFFERENTIAL/PLATELET
BASOS PCT: 0.5 % (ref 0.0–3.0)
Basophils Absolute: 0 10*3/uL (ref 0.0–0.1)
EOS PCT: 2.3 % (ref 0.0–5.0)
Eosinophils Absolute: 0.2 10*3/uL (ref 0.0–0.7)
HEMATOCRIT: 38.9 % (ref 36.0–46.0)
HEMOGLOBIN: 12.8 g/dL (ref 12.0–15.0)
LYMPHS PCT: 19.9 % (ref 12.0–46.0)
Lymphs Abs: 1.5 10*3/uL (ref 0.7–4.0)
MCHC: 33 g/dL (ref 30.0–36.0)
MCV: 95.4 fl (ref 78.0–100.0)
MONO ABS: 0.5 10*3/uL (ref 0.1–1.0)
MONOS PCT: 6.4 % (ref 3.0–12.0)
Neutro Abs: 5.5 10*3/uL (ref 1.4–7.7)
Neutrophils Relative %: 70.9 % (ref 43.0–77.0)
Platelets: 265 10*3/uL (ref 150.0–400.0)
RBC: 4.07 Mil/uL (ref 3.87–5.11)
RDW: 14.4 % (ref 11.5–15.5)
WBC: 7.8 10*3/uL (ref 4.0–10.5)

## 2016-10-17 LAB — TSH: TSH: 2 u[IU]/mL (ref 0.35–4.50)

## 2016-10-17 LAB — COMPREHENSIVE METABOLIC PANEL
ALBUMIN: 4.4 g/dL (ref 3.5–5.2)
ALK PHOS: 58 U/L (ref 39–117)
ALT: 21 U/L (ref 0–35)
AST: 22 U/L (ref 0–37)
BUN: 22 mg/dL (ref 6–23)
CALCIUM: 10.7 mg/dL — AB (ref 8.4–10.5)
CHLORIDE: 99 meq/L (ref 96–112)
CO2: 33 mEq/L — ABNORMAL HIGH (ref 19–32)
Creatinine, Ser: 0.97 mg/dL (ref 0.40–1.20)
GFR: 59.97 mL/min — AB (ref >60.00)
Glucose, Bld: 101 mg/dL — ABNORMAL HIGH (ref 70–99)
POTASSIUM: 3.7 meq/L (ref 3.5–5.1)
SODIUM: 140 meq/L (ref 135–145)
TOTAL PROTEIN: 7.1 g/dL (ref 6.0–8.3)
Total Bilirubin: 0.7 mg/dL (ref 0.2–1.2)

## 2016-10-17 LAB — LIPID PANEL
Cholesterol: 144 mg/dL (ref 0–200)
HDL: 62.3 mg/dL (ref >39.00)
LDL Cholesterol: 70 mg/dL (ref 0–99)
NONHDL: 81.91
Total CHOL/HDL Ratio: 2
Triglycerides: 58 mg/dL (ref 0.0–149.0)
VLDL: 11.6 mg/dL (ref 0.0–40.0)

## 2016-10-17 LAB — HEMOGLOBIN A1C: Hgb A1c MFr Bld: 6.1 % (ref 4.6–6.5)

## 2016-10-17 LAB — URIC ACID: Uric Acid, Serum: 4.8 mg/dL (ref 2.4–7.0)

## 2016-10-17 NOTE — Progress Notes (Signed)
PCP notes:   Health maintenance:  Hep C screening - completed  Abnormal screenings:   None  Patient concerns:   None  Nurse concerns:  None  Next PCP appt:   10/21/16 @ 0930  I reviewed health advisor's note, was available for consultation, and agree with documentation and plan. Loura Pardon MD

## 2016-10-17 NOTE — Progress Notes (Signed)
Pre visit review using our clinic review tool, if applicable. No additional management support is needed unless otherwise documented below in the visit note. 

## 2016-10-17 NOTE — Patient Instructions (Signed)
Andrea Peters , Thank you for taking time to come for your Medicare Wellness Visit. I appreciate your ongoing commitment to your health goals. Please review the following plan we discussed and let me know if I can assist you in the future.   These are the goals we discussed: Goals    . Increase water intake          Starting 10/17/2016, I will attempt to drink 6-8 glasses of water daily.        This is a list of the screening recommended for you and due dates:  Health Maintenance  Topic Date Due  . Mammogram  12/28/2016  . Tetanus Vaccine  01/29/2018  . Colon Cancer Screening  11/14/2023  . Flu Shot  Addressed  . DEXA scan (bone density measurement)  Completed  . Shingles Vaccine  Completed  .  Hepatitis C: One time screening is recommended by Center for Disease Control  (CDC) for  adults born from [birth year] through 1967.   Completed  . Pneumonia vaccines  Completed   Preventive Care for Adults  A healthy lifestyle and preventive care can promote health and wellness. Preventive health guidelines for adults include the following key practices.  . A routine yearly physical is a good way to check with your health care provider about your health and preventive screening. It is a chance to share any concerns and updates on your health and to receive a thorough exam.  . Visit your dentist for a routine exam and preventive care every 6 months. Brush your teeth twice a day and floss once a day. Good oral hygiene prevents tooth decay and gum disease.  . The frequency of eye exams is based on your age, health, family medical history, use  of contact lenses, and other factors. Follow your health care provider's ecommendations for frequency of eye exams.  . Eat a healthy diet. Foods like vegetables, fruits, whole grains, low-fat dairy products, and lean protein foods contain the nutrients you need without too many calories. Decrease your intake of foods high in solid fats, added sugars, and salt. Eat  the right amount of calories for you. Get information about a proper diet from your health care provider, if necessary.  . Regular physical exercise is one of the most important things you can do for your health. Most adults should get at least 150 minutes of moderate-intensity exercise (any activity that increases your heart rate and causes you to sweat) each week. In addition, most adults need muscle-strengthening exercises on 2 or more days a week.  Silver Sneakers may be a benefit available to you. To determine eligibility, you may visit the website: www.silversneakers.com or contact program at 260-613-0697 Mon-Fri between 8AM-8PM.   . Maintain a healthy weight. The body mass index (BMI) is a screening tool to identify possible weight problems. It provides an estimate of body fat based on height and weight. Your health care provider can find your BMI and can help you achieve or maintain a healthy weight.   For adults 20 years and older: ? A BMI below 18.5 is considered underweight. ? A BMI of 18.5 to 24.9 is normal. ? A BMI of 25 to 29.9 is considered overweight. ? A BMI of 30 and above is considered obese.   . Maintain normal blood lipids and cholesterol levels by exercising and minimizing your intake of saturated fat. Eat a balanced diet with plenty of fruit and vegetables. Blood tests for lipids and cholesterol should begin  at age 59 and be repeated every 5 years. If your lipid or cholesterol levels are high, you are over 50, or you are at high risk for heart disease, you may need your cholesterol levels checked more frequently. Ongoing high lipid and cholesterol levels should be treated with medicines if diet and exercise are not working.  . If you smoke, find out from your health care provider how to quit. If you do not use tobacco, please do not start.  . If you choose to drink alcohol, please do not consume more than 2 drinks per day. One drink is considered to be 12 ounces (355 mL)  of beer, 5 ounces (148 mL) of wine, or 1.5 ounces (44 mL) of liquor.  . If you are 65-7 years old, ask your health care provider if you should take aspirin to prevent strokes.  . Use sunscreen. Apply sunscreen liberally and repeatedly throughout the day. You should seek shade when your shadow is shorter than you. Protect yourself by wearing long sleeves, pants, a wide-brimmed hat, and sunglasses year round, whenever you are outdoors.  . Once a month, do a whole body skin exam, using a mirror to look at the skin on your back. Tell your health care provider of new moles, moles that have irregular borders, moles that are larger than a pencil eraser, or moles that have changed in shape or color.

## 2016-10-17 NOTE — Progress Notes (Signed)
Subjective:   Andrea Peters is a 71 y.o. female who presents for Medicare Annual (Subsequent) preventive examination.  Review of Systems:  N/A Cardiac Risk Factors include: advanced age (>62mn, >>58women);obesity (BMI >30kg/m2);hypertension;dyslipidemia     Objective:     Vitals: BP (!) 145/88 (BP Location: Left Arm, Patient Position: Sitting, Cuff Size: Normal)   Pulse 70   Temp 97.9 F (36.6 C) (Oral)   Ht '5\' 1"'$  (1.549 m) Comment: no shoes  Wt 202 lb 8 oz (91.9 kg)   SpO2 97%   BMI 38.26 kg/m   Body mass index is 38.26 kg/m.   Tobacco History  Smoking Status  . Never Smoker  Smokeless Tobacco  . Never Used     Counseling given: No   Past Medical History:  Diagnosis Date  . Allergic rhinitis   . Hyperglycemia   . Hyperlipidemia   . Hypertension   . [place]   . Mixed incontinence   . Obesity   . Plantar fasciitis    with significant disability  . Tendonitis of foot    L, chronic foot pain   Past Surgical History:  Procedure Laterality Date  . ABDOMINAL HYSTERECTOMY     fibroids, ovaries intact  . BREAST BIOPSY Bilateral   . [facility] TENDON REPAIR  5/07   Tendon rupture L foot   Family History  Problem Relation Age of Onset  . Heart attack Mother   . Other Mother     ?thyroid problems  . Lung cancer Father    History  Sexual Activity  . Sexual activity: Not on file    Outpatient Encounter Prescriptions as of 10/17/2016  Medication Sig  . albuterol (PROVENTIL HFA;VENTOLIN HFA) 108 (90 Base) MCG/ACT inhaler Inhale 2 puffs into the lungs every 4 (four) hours as needed for wheezing or shortness of breath (cough, shortness of breath or wheezing.).  .Marland Kitchenallopurinol (ZYLOPRIM) 300 MG tablet TAKE 1 TABLET BY MOUTH EVERY DAY  . aspirin 81 MG tablet Take 81 mg by mouth daily.    .Marland KitchenCALCIUM-VITAMIN D PO Take 1 tablet by mouth daily.    . colchicine 0.6 MG tablet as needed. Take 2  tablets now with food and  then 1 tablet twice a day with food  . fluticasone (FLONASE) 50 MCG/ACT nasal spray PLACE 2 SPRAYS INTO BOTH NOSTRILS DAILY.  . fluticasone furoate-vilanterol (BREO ELLIPTA) 200-25 MCG/INH AEPB Inhale 1 puff into the lungs daily.  .Marland KitchenguaiFENesin (MUCINEX) 600 MG 12 hr tablet Take 1,200 mg by mouth daily.  . hydrochlorothiazide (HYDRODIURIL) 25 MG tablet TAKE 1 TABLET BY MOUTH EVERY DAY  . KLOR-CON 10 10 MEQ tablet Take 1 tablet (10 mEq total) by mouth daily. (Patient taking differently: Take 5 mEq by mouth daily. )  . meloxicam (MOBIC) 15 MG tablet TAKE 1 TABLET BY MOUTH DAILY.  . Multiple Vitamin (MULTIVITAMIN) tablet Take 2 tablets by mouth daily.   .Marland Kitchenoxybutynin (DITROPAN-XL) 10 MG 24 hr tablet Take 1 tablet (10 mg total) by mouth at bedtime.  . simvastatin (ZOCOR) 20 MG tablet TAKE 1 TABLET BY MOUTH AT BEDTIME  . valsartan-hydrochlorothiazide (DIOVAN-HCT) 160-12.5 MG tablet Take 1 tablet by mouth daily.  . [DISCONTINUED] azithromycin (ZITHROMAX Z-PAK) 250 MG tablet Take 2 pills by mouth today and then 1 pill daily for 4 days  . [DISCONTINUED] benzonatate (TESSALON) 200 MG capsule Take 1 capsule (200 mg total) by mouth 3 (three) times daily  as needed for cough. Swallow whole- to not bite or chew pill  . [DISCONTINUED] chlorpheniramine-HYDROcodone (TUSSIONEX PENNKINETIC ER) 10-8 MG/5ML SUER Take 5 mLs by mouth every 12 (twelve) hours as needed for cough. Watch out for sedation  . [DISCONTINUED] predniSONE (DELTASONE) 10 MG tablet Take 3 pills once daily by mouth for 3 days, then 2 pills once daily for 3 days, then 1 pill once daily for 3 days and then stop   No facility-administered encounter medications on file as of 10/17/2016.     Activities of Daily Living In your present state of health, do you have any difficulty performing the following activities: 10/17/2016  Hearing? Y  Vision? N  Difficulty concentrating or making decisions? N  Walking or climbing stairs? N  Dressing or bathing? N    Doing errands, shopping? N  Preparing Food and eating ? N  Using the Toilet? N  In the past six months, have you accidently leaked urine? Y  Do you have problems with loss of bowel control? N  Managing your Medications? N  Managing your Finances? N  Housekeeping or managing your Housekeeping? N  Some recent data might be hidden    Patient Care Team: Abner Greenspan, MD as PCP - General Amy Dennie Maizes, MD as Referring Physician (Ophthalmology) Andi Devon, MD as Referring Physician (General Surgery) Brendolyn Patty, MD as Consulting Physician (Dermatology) Laverle Hobby, MD as Consulting Physician (Pulmonary Disease)    Assessment:     Hearing Screening   '125Hz'$  '250Hz'$  '500Hz'$  '1000Hz'$  '2000Hz'$  '3000Hz'$  '4000Hz'$  '6000Hz'$  '8000Hz'$   Right ear:           Left ear:   40 40 40  40    Comments: Hearing loss in right ear; not a hearing aid candidate  Vision Screening Comments: Last vision exam in April 2017  Exercise Activities and Dietary recommendations Current Exercise Habits: The patient does not participate in regular exercise at present, Exercise limited by: None identified  Goals    . Increase water intake          Starting 10/17/2016, I will attempt to drink 6-8 glasses of water daily.       Fall Risk Fall Risk  10/17/2016 10/19/2015 10/15/2014 10/15/2013  Falls in the past year? No No No Yes  Number falls in past yr: - - - 1  Injury with Fall? - - - No   Depression Screen PHQ 2/9 Scores 10/17/2016 10/19/2015 10/15/2014 10/15/2013  PHQ - 2 Score 0 0 0 0     Cognitive Function MMSE - Mini Mental State Exam 10/17/2016  Orientation to time 5  Orientation to Place 5  Registration 3  Attention/ Calculation 0  Recall 3  Language- name 2 objects 0  Language- repeat 1  Language- follow 3 step command 3  Language- read & follow direction 0  Write a sentence 0  Copy design 0  Total score 20     PLEASE NOTE: A Mini-Cog screen was completed. Maximum score is 20. A value of 0 denotes this  part of Folstein MMSE was not completed or the patient failed this part of the Mini-Cog screening.   Mini-Cog Screening Orientation to Time - Max 5 pts Orientation to Place - Max 5 pts Registration - Max 3 pts Recall - Max 3 pts Language Repeat - Max 1 pts Language Follow 3 Step Command - Max 3 pts     Immunization History  Administered Date(s) Administered  . Influenza,inj,Quad PF,36+ Mos 10/15/2014, 10/19/2015, 08/09/2016  .  Pneumococcal Conjugate-13 10/15/2014  . Pneumococcal Polysaccharide-23 03/22/2010  . Td 01/13/1998, 01/30/2008  . Zoster 02/05/2008   Screening Tests Health Maintenance  Topic Date Due  . MAMMOGRAM  12/28/2016  . TETANUS/TDAP  01/29/2018  . COLONOSCOPY  11/14/2023  . INFLUENZA VACCINE  Addressed  . DEXA SCAN  Completed  . ZOSTAVAX  Completed  . Hepatitis C Screening  Completed  . PNA vac Low Risk Adult  Completed      Plan:     I have personally reviewed and addressed the Medicare Annual Wellness questionnaire and have noted the following in the patient's chart:  A. Medical and social history B. Use of alcohol, tobacco or illicit drugs  C. Current medications and supplements D. Functional ability and status E.  Nutritional status F.  Physical activity G. Advance directives H. List of other physicians I.  Hospitalizations, surgeries, and ER visits in previous 12 months J.  [place] to include hearing, vision, cognitive, depression L. Referrals and appointments - none  In addition, I have reviewed and discussed with patient certain preventive protocols, quality metrics, and best practice recommendations. A written personalized care plan for preventive services as well as general preventive health recommendations were provided to patient.  See attached scanned questionnaire for additional information.   Signed,   Lindell Noe, MHA, BS, LPN Health Coach

## 2016-10-18 LAB — HEPATITIS C ANTIBODY: HCV AB: NEGATIVE

## 2016-10-21 ENCOUNTER — Encounter: Payer: Self-pay | Admitting: Family Medicine

## 2016-10-21 ENCOUNTER — Other Ambulatory Visit: Payer: Self-pay | Admitting: Family Medicine

## 2016-10-21 ENCOUNTER — Ambulatory Visit (INDEPENDENT_AMBULATORY_CARE_PROVIDER_SITE_OTHER): Payer: Medicare Other | Admitting: Family Medicine

## 2016-10-21 VITALS — BP 126/68 | HR 63 | Temp 97.6°F | Ht 61.0 in | Wt 202.2 lb

## 2016-10-21 DIAGNOSIS — E2839 Other primary ovarian failure: Secondary | ICD-10-CM

## 2016-10-21 DIAGNOSIS — E7800 Pure hypercholesterolemia, unspecified: Secondary | ICD-10-CM

## 2016-10-21 DIAGNOSIS — Z1211 Encounter for screening for malignant neoplasm of colon: Secondary | ICD-10-CM | POA: Diagnosis not present

## 2016-10-21 DIAGNOSIS — M8589 Other specified disorders of bone density and structure, multiple sites: Secondary | ICD-10-CM | POA: Diagnosis not present

## 2016-10-21 DIAGNOSIS — R739 Hyperglycemia, unspecified: Secondary | ICD-10-CM | POA: Diagnosis not present

## 2016-10-21 DIAGNOSIS — I10 Essential (primary) hypertension: Secondary | ICD-10-CM | POA: Diagnosis not present

## 2016-10-21 DIAGNOSIS — M1A9XX0 Chronic gout, unspecified, without tophus (tophi): Secondary | ICD-10-CM | POA: Diagnosis not present

## 2016-10-21 DIAGNOSIS — Z1231 Encounter for screening mammogram for malignant neoplasm of breast: Secondary | ICD-10-CM

## 2016-10-21 MED ORDER — FLUTICASONE PROPIONATE 50 MCG/ACT NA SUSP: NASAL | 3 refills | Status: DC

## 2016-10-21 MED ORDER — HYDROCHLOROTHIAZIDE 25 MG PO TABS: 25.0000 mg | ORAL_TABLET | Freq: Every day | ORAL | 3 refills | Status: DC

## 2016-10-21 MED ORDER — ALLOPURINOL 300 MG PO TABS: 300.0000 mg | ORAL_TABLET | Freq: Every day | ORAL | 3 refills | Status: DC

## 2016-10-21 MED ORDER — OXYBUTYNIN CHLORIDE ER 10 MG PO TB24: 10.0000 mg | ORAL_TABLET | Freq: Every day | ORAL | 3 refills | Status: DC

## 2016-10-21 MED ORDER — SIMVASTATIN 20 MG PO TABS: 20.0000 mg | ORAL_TABLET | Freq: Every day | ORAL | 3 refills | Status: DC

## 2016-10-21 MED ORDER — MELOXICAM 15 MG PO TABS: 15.0000 mg | ORAL_TABLET | Freq: Every day | ORAL | 3 refills | Status: DC

## 2016-10-21 MED ORDER — KLOR-CON 10 10 MEQ PO TBCR: 5.0000 meq | EXTENDED_RELEASE_TABLET | Freq: Every day | ORAL | 3 refills | Status: DC

## 2016-10-21 NOTE — Progress Notes (Signed)
Subjective:    Patient ID: Andrea Peters, female    DOB: [birth year], 71 y.o.   MRN: [id number]  HPI Here for annual f/u of chronic medical problems   Taking care of husband with a knee replacement  He is doing very well  Also looks after 3 yo neighbor   Wt Readings from Last 3 Encounters:  10/21/16 202 lb 4 oz (91.7 kg)  10/17/16 202 lb 8 oz (91.9 kg)  08/09/16 200 lb 4 oz (90.8 kg)  bmi is 38.2 Not exercising  Diet is fair    Had her AMW visit 2/12 Hep C screen neg  Baseline hearing loss R ear   Hep C screen neg   Mammogram 4/17 normal Self breast exam -no lumps   Pap/gyn care : pap nl 5/08 Hysterectomy for fibroids in the past/ partial- then had to have ovaries removed after that  No pain or bloating or symptoms   Zoster vaccine utd Flu vaccine utd  Tetanus vaccine utd pna - utd   Colonoscopy/ screening : 3/15 polyps (done at [place]) ? Due 72 y or earlier   dexa 4/16 osteopenia  Will be due at earliest 4/18  No falls  No fractures  Taking her ca and vit D  bp is stable today  No cp or palpitations or headaches or edema  No side effects to medicines  BP Readings from Last 3 Encounters:  10/21/16 126/68  10/17/16 (!) 145/88  08/09/16 130/80     Hx of hyperglycemia Lab Results  Component Value Date   HGBA1C 6.1 10/17/2016  up from 6.0  Diet is not as good as it should be  Eating for convenience and eating out    Hx of hyperlipidemia Lab Results  Component Value Date   CHOL 144 10/17/2016   CHOL 128 10/13/2015   CHOL 136 10/08/2014   Lab Results  Component Value Date   HDL 62.30 10/17/2016   HDL 45.80 10/13/2015   HDL 47.40 10/08/2014   Lab Results  Component Value Date   LDLCALC 70 10/17/2016   LDLCALC 68 10/13/2015   LDLCALC 70 10/08/2014   Lab Results  Component Value Date   TRIG 58.0 10/17/2016   TRIG 70.0 10/13/2015   TRIG 91.0 10/08/2014   Lab Results  Component Value Date   CHOLHDL 2 10/17/2016   CHOLHDL 3  10/13/2015   CHOLHDL 3 10/08/2014   Lab Results  Component Value Date   LDLDIRECT 141.9 01/23/2007   On simvastatin and diet  Very well controlled   Hx of gout  On allopurinol  Lab Results  Component Value Date   LABURIC 4.8 10/17/2016  well controlled since she changed to 300  No issues or gout flares   Lab Results  Component Value Date   WBC 7.8 10/17/2016   HGB 12.8 10/17/2016   HCT 38.9 10/17/2016   MCV 95.4 10/17/2016   PLT 265.0 10/17/2016     Chemistry      Component Value Date/Time   NA 140 10/17/2016 1518   K 3.7 10/17/2016 1518   CL 99 10/17/2016 1518   CO2 33 (H) 10/17/2016 1518   BUN 22 10/17/2016 1518   CREATININE 0.97 10/17/2016 1518      Component Value Date/Time   CALCIUM 10.7 (H) 10/17/2016 1518   ALKPHOS 58 10/17/2016 1518   AST 22 10/17/2016 1518   ALT 21 10/17/2016 1518   BILITOT 0.7 10/17/2016 1518      Lab Results  Component Value Date   TSH 2.00 10/17/2016     Patient Active Problem List   Diagnosis Date Noted  . Cough 03/29/2016  . Fatigue 03/29/2016  . Hypokalemia 10/19/2015  . Osteopenia 10/19/2015  . Estrogen deficiency 10/15/2014  . Encounter for Medicare annual wellness exam 10/15/2013  . Colon cancer screening 10/15/2013  . Right-sided chest wall pain 10/15/2013  . Leg weakness, bilateral 03/13/2013  . Other screening mammogram 03/30/2011  . Gout 11/17/2010  . CHOLELITHIASIS, HX OF 11/17/2010  . HYPERCHOLESTEROLEMIA, PURE 05/17/2007  . Hyperglycemia 05/17/2007  . ALLERGIC  RHINITIS 01/23/2007  . Essential hypertension 12/20/2006  . [place] 12/20/2006  . URINARY INCONTINENCE, STRESS 12/20/2006   Past Medical History:  Diagnosis Date  . Allergic rhinitis   . Hyperglycemia   . Hyperlipidemia   . Hypertension   . Melanoma ([place])   . Mixed incontinence   . Obesity   . Plantar fasciitis    with significant disability  . Tendonitis of foot    L, chronic foot pain   Past Surgical History:  Procedure  Laterality Date  . ABDOMINAL HYSTERECTOMY     fibroids, ovaries intact  . BREAST BIOPSY Bilateral   . EYE SURGERY Bilateral 12/2015   at [facility]  . TENDON REPAIR  5/07   Tendon rupture L foot   Social History  Substance Use Topics  . Smoking status: Never Smoker  . Smokeless tobacco: Never Used  . Alcohol use No   Family History  Problem Relation Age of Onset  . Heart attack Mother   . Other Mother     ?thyroid problems  . Lung cancer Father    Allergies  Allergen Reactions  . Lipitor [Atorvastatin]     Muscle pain  . Naproxen Sodium   . Naproxen Other (See Comments) and Rash    Severe indigestion   Current Outpatient Prescriptions on File Prior to Visit  Medication Sig Dispense Refill  . albuterol (PROVENTIL HFA;VENTOLIN HFA) 108 (90 Base) MCG/ACT inhaler Inhale 2 puffs into the lungs every 4 (four) hours as needed for wheezing or shortness of breath (cough, shortness of breath or wheezing.). 1 Inhaler 1  . aspirin 81 MG tablet Take 81 mg by mouth daily.      Marland Kitchen CALCIUM-VITAMIN D PO Take 1 tablet by mouth daily.      . fluticasone furoate-vilanterol (BREO ELLIPTA) 200-25 MCG/INH AEPB Inhale 1 puff into the lungs daily. 120 each 3  . guaiFENesin (MUCINEX) 600 MG 12 hr tablet Take 1,200 mg by mouth daily.    . Multiple Vitamin (MULTIVITAMIN) tablet Take 2 tablets by mouth daily.     . valsartan-hydrochlorothiazide (DIOVAN-HCT) 160-12.5 MG tablet Take 1 tablet by mouth daily. 7 tablet 0  . colchicine 0.6 MG tablet as needed. Take 2  tablets now with food and then 1 tablet twice a day with food     No current facility-administered medications on file prior to visit.      Review of Systems Review of Systems  Constitutional: Negative for fever, appetite change, fatigue and unexpected weight change.  Eyes: Negative for pain and visual disturbance.  ENT pos for hearing loss in R ear Respiratory: Negative for cough and shortness of breath.   Cardiovascular: Negative for cp or  palpitations    Gastrointestinal: Negative for nausea, diarrhea and constipation.  Genitourinary: Negative for urgency and frequency.  Skin: Negative for pallor or rash   Neurological: Negative for weakness, light-headedness, numbness and headaches.  Hematological: Negative  for adenopathy. Does not bruise/bleed easily.  Psychiatric/Behavioral: Negative for dysphoric mood. The patient is not nervous/anxious.         Objective:   Physical Exam  Constitutional: She appears well-developed and well-nourished. No distress.  obese and well appearing   HENT:  Head: Normocephalic and atraumatic.  Right Ear: External ear normal.  Left Ear: External ear normal.  Mouth/Throat: Oropharynx is clear and moist.  Eyes: Conjunctivae and EOM are normal. Pupils are equal, round, and reactive to light. No scleral icterus.  Neck: Normal range of motion. Neck supple. No JVD present. Carotid bruit is not present. No thyromegaly present.  Cardiovascular: Normal rate, regular rhythm, normal heart sounds and intact distal pulses.  Exam reveals no gallop.   Pulmonary/Chest: Effort normal and breath sounds normal. No respiratory distress. She has no wheezes. She exhibits no tenderness.  Abdominal: Soft. Bowel sounds are normal. She exhibits no distension, no abdominal bruit and no mass. There is no tenderness.  Genitourinary: No breast swelling, tenderness, discharge or bleeding.  Genitourinary Comments: Breast exam: No mass, nodules, thickening, tenderness, bulging, retraction, inflamation, nipple discharge or skin changes noted.  No axillary or clavicular LA.      Musculoskeletal: Normal range of motion. She exhibits no edema or tenderness.  No kyphosis   Lymphadenopathy:    She has no cervical adenopathy.  Neurological: She is alert. She has normal reflexes. No cranial nerve deficit. She exhibits normal muscle tone. Coordination normal.  Skin: Skin is warm and dry. No rash noted. No erythema. No pallor.  Some  lentigines and sks  Psychiatric: She has a normal mood and affect.          Assessment & Plan:   Problem List Items Addressed This Visit      Cardiovascular and Mediastinum   Essential hypertension - Primary    bp in fair control at this time  BP Readings from Last 1 Encounters:  10/21/16 126/68   No changes needed Disc lifstyle change with low sodium diet and exercise  Labs reviewed       Relevant Medications   simvastatin (ZOCOR) 20 MG tablet   hydrochlorothiazide (HYDRODIURIL) 25 MG tablet     Musculoskeletal and Integument   Osteopenia    dexa scheduled for April  No falls or fx Disc need for calcium/ vitamin D/ wt bearing exercise and bone density test every 2 y to monitor Disc safety/ fracture risk in detail          Other   Colon cancer screening    Colonoscopy 3/15 with polyps Will call to find out when recall is       Estrogen deficiency   Relevant Orders   DG Bone Density   Gout    No gout flares on allopurinol  Lab Results  Component Value Date   LABURIC 4.8 10/17/2016   Continue to follow low purine diet and adequate hydration       HYPERCHOLESTEROLEMIA, PURE    Disc goals for lipids and reasons to control them Rev labs with pt Rev low sat fat diet in detail Very well controlled with simvasta and diet       Relevant Medications   simvastatin (ZOCOR) 20 MG tablet   hydrochlorothiazide (HYDRODIURIL) 25 MG tablet   Hyperglycemia    Lab Results  Component Value Date   HGBA1C 6.1 10/17/2016   Disc status of borderline diabetes  Low glycemic diet/wt loss and exercise recommended Will continue to follow

## 2016-10-21 NOTE — Patient Instructions (Addendum)
You are due for a mammogram in April- don't forget to schedule it  Also due for bone density test -I will do a referral for that   Your weight and diet put you at risk for diabetes Try to get 30 minutes of exercise daily  Also get away from sugar and refined carbs   Take care of yourself

## 2016-10-21 NOTE — Progress Notes (Signed)
Pre visit review using our clinic review tool, if applicable. No additional management support is needed unless otherwise documented below in the visit note. 

## 2016-10-22 NOTE — Assessment & Plan Note (Signed)
dexa scheduled for April  No falls or fx Disc need for calcium/ vitamin D/ wt bearing exercise and bone density test every 2 y to monitor Disc safety/ fracture risk in detail

## 2016-10-22 NOTE — Assessment & Plan Note (Signed)
Colonoscopy 3/15 with polyps Will call to find out when recall is

## 2016-10-22 NOTE — Assessment & Plan Note (Signed)
Lab Results  Component Value Date   HGBA1C 6.1 10/17/2016   Disc status of borderline diabetes  Low glycemic diet/wt loss and exercise recommended Will continue to follow

## 2016-10-22 NOTE — Assessment & Plan Note (Signed)
bp in fair control at this time  BP Readings from Last 1 Encounters:  10/21/16 126/68   No changes needed Disc lifstyle change with low sodium diet and exercise  Labs reviewed

## 2016-10-22 NOTE — Assessment & Plan Note (Signed)
No gout flares on allopurinol  Lab Results  Component Value Date   LABURIC 4.8 10/17/2016   Continue to follow low purine diet and adequate hydration

## 2016-10-22 NOTE — Assessment & Plan Note (Signed)
Disc goals for lipids and reasons to control them Rev labs with pt Rev low sat fat diet in detail Very well controlled with simvasta and diet

## 2016-10-25 ENCOUNTER — Telehealth: Payer: Self-pay | Admitting: *Deleted

## 2016-10-25 NOTE — Telephone Encounter (Signed)
Is there a 5 mg available or a more split able 10 mg ?

## 2016-10-25 NOTE — Telephone Encounter (Signed)
Received at fax from [facility] saying that the Klor-con 65mq is no recommended for splitting. Your directions were to take 1/2 tab daily, please advise if you want to change directions or keep it the same

## 2016-10-26 NOTE — Telephone Encounter (Signed)
Spoke with pharmacist and there is a Micro-dispersible 49mq K pill that can be split or dissolved in water, pharmacist changed the K pill to that one and they will ship that one to pt

## 2016-11-04 ENCOUNTER — Other Ambulatory Visit: Payer: Self-pay | Admitting: Family Medicine

## 2016-11-21 ENCOUNTER — Other Ambulatory Visit: Payer: Self-pay | Admitting: Family Medicine

## 2016-12-12 ENCOUNTER — Other Ambulatory Visit: Payer: Self-pay | Admitting: Family Medicine

## 2016-12-14 DIAGNOSIS — Z6838 Body mass index (BMI) 38.0-38.9, adult: Secondary | ICD-10-CM | POA: Diagnosis not present

## 2016-12-14 DIAGNOSIS — C4361 Malignant melanoma of right upper limb, including shoulder: Secondary | ICD-10-CM | POA: Diagnosis not present

## 2016-12-22 DIAGNOSIS — H0234 Blepharochalasis left upper eyelid: Secondary | ICD-10-CM | POA: Diagnosis not present

## 2016-12-22 DIAGNOSIS — H2513 Age-related nuclear cataract, bilateral: Secondary | ICD-10-CM | POA: Diagnosis not present

## 2016-12-22 DIAGNOSIS — H524 Presbyopia: Secondary | ICD-10-CM | POA: Diagnosis not present

## 2016-12-22 DIAGNOSIS — H43393 Other vitreous opacities, bilateral: Secondary | ICD-10-CM | POA: Diagnosis not present

## 2016-12-22 DIAGNOSIS — H0231 Blepharochalasis right upper eyelid: Secondary | ICD-10-CM | POA: Diagnosis not present

## 2016-12-31 ENCOUNTER — Other Ambulatory Visit: Payer: Self-pay | Admitting: Family Medicine

## 2017-01-01 ENCOUNTER — Other Ambulatory Visit: Payer: Self-pay | Admitting: Family Medicine

## 2017-01-02 NOTE — Telephone Encounter (Signed)
Please refill all for 1 year-local

## 2017-01-02 NOTE — Telephone Encounter (Signed)
Refilled as instructed.

## 2017-01-02 NOTE — Telephone Encounter (Signed)
Received refill request electronically All scripts were refilled to mail order pharmacy 2/16/1/8 at last office visit Spoke to patient and was advised that script at last visit were sent to wrong pharmacy and should have gone to local. Patient stated that she called her mail order and told them to cancel them. Okay to fill at local? See allergy/contraindication Meloxicam

## 2017-01-04 ENCOUNTER — Ambulatory Visit
Admission: RE | Admit: 2017-01-04 | Discharge: 2017-01-04 | Disposition: A | Discharge status: Home / Self Care | Payer: Medicare Other | Source: Ambulatory Visit | Attending: Family Medicine | Admitting: Family Medicine

## 2017-01-04 DIAGNOSIS — M85852 Other specified disorders of bone density and structure, left thigh: Secondary | ICD-10-CM | POA: Diagnosis not present

## 2017-01-04 DIAGNOSIS — E2839 Other primary ovarian failure: Secondary | ICD-10-CM | POA: Diagnosis not present

## 2017-01-04 DIAGNOSIS — Z13820 Encounter for screening for osteoporosis: Secondary | ICD-10-CM | POA: Diagnosis not present

## 2017-01-04 DIAGNOSIS — Z1231 Encounter for screening mammogram for malignant neoplasm of breast: Secondary | ICD-10-CM | POA: Diagnosis not present

## 2017-01-04 LAB — HM DEXA SCAN

## 2017-01-05 ENCOUNTER — Encounter: Payer: Self-pay | Admitting: *Deleted

## 2017-03-30 ENCOUNTER — Telehealth: Payer: Self-pay

## 2017-03-30 MED ORDER — LOSARTAN POTASSIUM-HCTZ 100-12.5 MG PO TABS: 1.0000 | ORAL_TABLET | Freq: Every day | ORAL | 3 refills | Status: DC

## 2017-03-30 NOTE — Telephone Encounter (Signed)
Pt notified of Dr. Marliss Coots comments and that Rx was sent to pharmacy

## 2017-03-30 NOTE — Telephone Encounter (Signed)
I sent losartan hct Suspect she will not be able to tell a difference but if any problems let me know

## 2017-03-30 NOTE — Telephone Encounter (Signed)
Pt left v/m that she received notice from pharmacy that valsartan had been recalled and pt request new med sent to [facility].

## 2017-04-01 ENCOUNTER — Other Ambulatory Visit: Payer: Self-pay | Admitting: Family Medicine

## 2017-05-31 ENCOUNTER — Other Ambulatory Visit: Payer: Self-pay | Admitting: Internal Medicine

## 2017-05-31 ENCOUNTER — Telehealth: Payer: Self-pay

## 2017-05-31 ENCOUNTER — Telehealth: Payer: Self-pay | Admitting: Internal Medicine

## 2017-05-31 MED ORDER — FLUTICASONE FUROATE-VILANTEROL 200-25 MCG/INH IN AEPB: 1.0000 | INHALATION_SPRAY | Freq: Every day | RESPIRATORY_TRACT | 3 refills | Status: DC

## 2017-05-31 NOTE — Telephone Encounter (Signed)
Pt called to ck on status of Breo refill; advised pt request was sent to Dr Ashby Dawes. Pt will call Dr Mathis Fare office.

## 2017-05-31 NOTE — Telephone Encounter (Signed)
Medication filled this am by Sonya C. To [facility]. Nothing further needed.

## 2017-05-31 NOTE — Telephone Encounter (Signed)
°*  STAT* If patient is at the pharmacy, call can be transferred to refill team.   1. Which medications need to be refilled? (please list name of each medication and dose if known)  breo  2. Which pharmacy/location (including street and city if local pharmacy) is medication to be sent to? [facility]   3. Do they need a 30 day or 90 day supply?  90 day

## 2017-07-11 DIAGNOSIS — Z6839 Body mass index (BMI) 39.0-39.9, adult: Secondary | ICD-10-CM | POA: Diagnosis not present

## 2017-07-11 DIAGNOSIS — C4361 Malignant melanoma of right upper limb, including shoulder: Secondary | ICD-10-CM | POA: Diagnosis not present

## 2017-07-25 ENCOUNTER — Telehealth: Payer: Self-pay | Admitting: Family Medicine

## 2017-07-25 ENCOUNTER — Ambulatory Visit: Payer: Self-pay

## 2017-07-25 NOTE — Telephone Encounter (Signed)
Pt triaged and wants to see Dr Glori Bickers only. Has appt 08/14/17 @ 0800.

## 2017-07-25 NOTE — Telephone Encounter (Signed)
Pt called with multiple complaints. Started Oct. 1 with Back pain, indigestion, headache,fatigue all which have gradually gotten worse. States indigestion is under rib cage worse on left side and has gotten relief with taking Gas-X.  Pt wanting to see Dr Glori Bickers only and is ok with waiting until 08/14/17 @ 0800.  Reason for Disposition . Nursing judgment or information in reference  Answer Assessment - Initial Assessment Questions 1. REASON FOR CALL: "What is your main concern right now?"     Indigestion, headache, BP "elevated" fatigue, feel like its the medicine 2. ONSET: "When did the ___ start?"     Started October 1  3. SEVERITY: "How bad is the ___?"     Indigestion= moderate to severe; H/A= moderate; fatigue=moderate to severe 4. FEVER: "Do you have a fever?"     no 5. OTHER SYMPTOMS: "Do you have any other new symptoms?"     H/o asthma no wheezing at this time  6. INTERVENTIONS AND RESPONSE: "What have you done so far to try to make this better? What medications have you used?"     Resting , gas -X helps a little bit 7. PREGNANCY: "Is there any chance you are pregnant?"     n/a  Protocols used: NO GUIDELINE AVAILABLE-A-AH

## 2017-07-25 NOTE — Telephone Encounter (Signed)
Copied from [place] [id number]. Topic: Quick Communication - See Telephone Encounter >> Jul 25, 2017  9:24 AM Arletha Grippe wrote: CRM for notification. See Telephone encounter for:   07/25/17.  Pt thinks that she is having side effects of losartan-hydrochlorothiazide (HYZAAR) 100-12.5 MG tablet. She is having headaches, indigestion, back pain, and high blood pressure, and tiredness. Her last bp was 134/80. She states it started out gradually and got worse. Attempted several times to reach triage, was busy.  [phone]

## 2017-08-14 ENCOUNTER — Ambulatory Visit: Payer: Medicare Other | Admitting: Family Medicine

## 2017-08-17 ENCOUNTER — Ambulatory Visit (INDEPENDENT_AMBULATORY_CARE_PROVIDER_SITE_OTHER): Payer: Medicare Other

## 2017-08-17 DIAGNOSIS — Z23 Encounter for immunization: Secondary | ICD-10-CM

## 2017-08-22 ENCOUNTER — Other Ambulatory Visit: Payer: Self-pay | Admitting: Family Medicine

## 2017-09-26 ENCOUNTER — Other Ambulatory Visit: Payer: Self-pay | Admitting: *Deleted

## 2017-09-26 MED ORDER — MELOXICAM 15 MG PO TABS: 15.0000 mg | ORAL_TABLET | Freq: Every day | ORAL | 0 refills | Status: DC

## 2017-09-26 MED ORDER — HYDROCHLOROTHIAZIDE 25 MG PO TABS: 25.0000 mg | ORAL_TABLET | Freq: Every day | ORAL | 0 refills | Status: DC

## 2017-09-26 MED ORDER — ALLOPURINOL 300 MG PO TABS: 300.0000 mg | ORAL_TABLET | Freq: Every day | ORAL | 0 refills | Status: DC

## 2017-09-26 MED ORDER — FLUTICASONE PROPIONATE 50 MCG/ACT NA SUSP: NASAL | 0 refills | Status: DC

## 2017-10-06 ENCOUNTER — Ambulatory Visit (INDEPENDENT_AMBULATORY_CARE_PROVIDER_SITE_OTHER): Payer: Medicare Other | Admitting: Family Medicine

## 2017-10-06 ENCOUNTER — Encounter: Payer: Self-pay | Admitting: Family Medicine

## 2017-10-06 VITALS — BP 138/78 | HR 76 | Temp 97.7°F | Wt 208.0 lb

## 2017-10-06 DIAGNOSIS — J069 Acute upper respiratory infection, unspecified: Secondary | ICD-10-CM

## 2017-10-06 MED ORDER — HYDROCODONE-HOMATROPINE 5-1.5 MG/5ML PO SYRP: 5.0000 mL | ORAL_SOLUTION | Freq: Three times a day (TID) | ORAL | 0 refills | Status: DC | PRN

## 2017-10-06 MED ORDER — AZITHROMYCIN 250 MG PO TABS: ORAL_TABLET | ORAL | 0 refills | Status: DC

## 2017-10-06 MED ORDER — BENZONATATE 100 MG PO CAPS: 100.0000 mg | ORAL_CAPSULE | Freq: Three times a day (TID) | ORAL | 0 refills | Status: DC | PRN

## 2017-10-06 MED ORDER — PREDNISONE 10 MG PO TABS
ORAL_TABLET | ORAL | 0 refills | Status: DC
Start: 2017-10-06 — End: 2017-10-18

## 2017-10-06 NOTE — Patient Instructions (Signed)
Good to see you today  Please drink lots of fluids and get extra rest  If not better in 3-4 days, let me or Dr. Glori Bickers know

## 2017-10-06 NOTE — Progress Notes (Signed)
Subjective:    Patient ID: Andrea Peters, female    DOB: December 01, 1945, 72 y.o.   MRN: [id number]  HPI This is a 72 yo female who presents today with cough and nasal congestion for 5 days. Started with watery eyes and nasal drainage. Moved to chest, bad coughing when she lies down at night. Feels SOB and has hear wheezing. Feels a little better today. No fever. Has not taken anything.  Husband going to [place] next week for CABG. She has had recurrent bronchitis and reactive airway. Much improved after starting on Breo. Last episode over a year ago. Has not tried albuterol inhaler for cough.   Past Medical History:  Diagnosis Date  . Allergic rhinitis   . Hyperglycemia   . Hyperlipidemia   . Hypertension   . Melanoma ([place])   . Mixed incontinence   . Obesity   . Plantar fasciitis    with significant disability  . Tendonitis of foot    L, chronic foot pain   Past Surgical History:  Procedure Laterality Date  . ABDOMINAL HYSTERECTOMY     fibroids, ovaries intact  . BREAST BIOPSY Bilateral yrs ago   benign  . EYE SURGERY Bilateral 12/2015   at [facility]  . TENDON REPAIR  5/07   Tendon rupture L foot   Family History  Problem Relation Age of Onset  . Heart attack Mother   . Other Mother        ?thyroid problems  . Lung cancer Father    Social History   Tobacco Use  . Smoking status: Never Smoker  . Smokeless tobacco: Never Used  Substance Use Topics  . Alcohol use: No    Alcohol/week: 0.0 oz  . Drug use: No      Review of Systems Per HPI    Objective:   Physical Exam  Constitutional: She is oriented to person, place, and time. She appears well-developed and well-nourished. She appears ill. No distress.  HENT:  Head: Normocephalic and atraumatic.  Right Ear: Tympanic membrane, external ear and ear canal normal.  Left Ear: Tympanic membrane, external ear and ear canal normal.  Nose: Mucosal edema and rhinorrhea present.  Mouth/Throat: Uvula is midline and  oropharynx is clear and moist.  Sounds congested.   Eyes: Conjunctivae are normal.  Cardiovascular: Normal rate, regular rhythm and normal heart sounds.  Pulmonary/Chest: Effort normal. No respiratory distress. She has wheezes (on forced expiration). She has no rales.  Frequent moist sounding cough.   Neurological: She is alert and oriented to person, place, and time.  Skin: Skin is warm and dry. She is not diaphoretic.  Psychiatric: She has a normal mood and affect. Her behavior is normal. Judgment and thought content normal.  Vitals reviewed.     BP 138/78   Pulse 76   Temp 97.7 F (36.5 C) (Oral)   Wt 208 lb (94.3 kg)   SpO2 97%   BMI 39.30 kg/m  Wt Readings from Last 3 Encounters:  10/06/17 208 lb (94.3 kg)  10/21/16 202 lb 4 oz (91.7 kg)  10/17/16 202 lb 8 oz (91.9 kg)       Assessment & Plan:  1. Upper respiratory tract infection, unspecified type - given previous difficulties clearing infection, cough and wheeze and her husband having CABG next week, will go ahead and treat with antibiotic and prednisone. She was instructed she can also use albuterol inhaler every 4-6 hours as needed - azithromycin (ZITHROMAX) 250 MG tablet; Take 2 tabs  PO x 1 dose, then 1 tab PO QD x 4 days  Dispense: 6 tablet; Refill: 0 - predniSONE (DELTASONE) 10 MG tablet; Take 5x1,4x1,3x1,2x1,1x1  Dispense: 15 tablet; Refill: 0 - HYDROcodone-homatropine (HYCODAN) 5-1.5 MG/5ML syrup; Take 5 mLs by mouth every 8 (eight) hours as needed for cough.  Dispense: 120 mL; Refill: 0 - benzonatate (TESSALON) 100 MG capsule; Take 1-2 capsules (100-200 mg total) by mouth 3 (three) times daily as needed for cough.  Dispense: 30 capsule; Refill: 0 - RTC/ER precautions reviewed  Clarene Reamer, FNP-BC  [facility]  10/06/2017 3:58 PM

## 2017-10-14 ENCOUNTER — Telehealth: Payer: Self-pay | Admitting: Family Medicine

## 2017-10-14 DIAGNOSIS — R739 Hyperglycemia, unspecified: Secondary | ICD-10-CM

## 2017-10-14 DIAGNOSIS — E7800 Pure hypercholesterolemia, unspecified: Secondary | ICD-10-CM

## 2017-10-14 DIAGNOSIS — I10 Essential (primary) hypertension: Secondary | ICD-10-CM

## 2017-10-14 DIAGNOSIS — M1A9XX0 Chronic gout, unspecified, without tophus (tophi): Secondary | ICD-10-CM

## 2017-10-14 NOTE — Telephone Encounter (Signed)
-----   Message from Eustace Pen, LPN sent at 01/06/6467 10:51 PM EST ----- Regarding: Labs 2/13 Lab orders needed. Thank you.  Insurance:  Medicare - traditional

## 2017-10-18 ENCOUNTER — Ambulatory Visit (INDEPENDENT_AMBULATORY_CARE_PROVIDER_SITE_OTHER): Payer: Medicare Other

## 2017-10-18 VITALS — BP 114/80 | HR 60 | Temp 97.8°F | Ht 61.5 in | Wt 204.0 lb

## 2017-10-18 DIAGNOSIS — E7800 Pure hypercholesterolemia, unspecified: Secondary | ICD-10-CM

## 2017-10-18 DIAGNOSIS — Z0000 Encounter for general adult medical examination without abnormal findings: Secondary | ICD-10-CM | POA: Diagnosis not present

## 2017-10-18 DIAGNOSIS — I10 Essential (primary) hypertension: Secondary | ICD-10-CM | POA: Diagnosis not present

## 2017-10-18 DIAGNOSIS — R739 Hyperglycemia, unspecified: Secondary | ICD-10-CM | POA: Diagnosis not present

## 2017-10-18 DIAGNOSIS — M1A9XX0 Chronic gout, unspecified, without tophus (tophi): Secondary | ICD-10-CM

## 2017-10-18 LAB — CBC WITH DIFFERENTIAL/PLATELET
BASOS ABS: 0.1 10*3/uL (ref 0.0–0.1)
Basophils Relative: 0.6 % (ref 0.0–3.0)
Eosinophils Absolute: 0.3 10*3/uL (ref 0.0–0.7)
Eosinophils Relative: 2.8 % (ref 0.0–5.0)
HCT: 40.1 % (ref 36.0–46.0)
Hemoglobin: 13.3 g/dL (ref 12.0–15.0)
LYMPHS ABS: 1.7 10*3/uL (ref 0.7–4.0)
Lymphocytes Relative: 17.3 % (ref 12.0–46.0)
MCHC: 33.1 g/dL (ref 30.0–36.0)
MCV: 93.8 fl (ref 78.0–100.0)
MONOS PCT: 7.4 % (ref 3.0–12.0)
Monocytes Absolute: 0.7 10*3/uL (ref 0.1–1.0)
NEUTROS ABS: 7 10*3/uL (ref 1.4–7.7)
NEUTROS PCT: 71.9 % (ref 43.0–77.0)
PLATELETS: 259 10*3/uL (ref 150.0–400.0)
RBC: 4.27 Mil/uL (ref 3.87–5.11)
RDW: 14.1 % (ref 11.5–15.5)
WBC: 9.8 10*3/uL (ref 4.0–10.5)

## 2017-10-18 LAB — LIPID PANEL
CHOL/HDL RATIO: 3
Cholesterol: 130 mg/dL (ref 0–200)
HDL: 51.5 mg/dL (ref >39.00)
LDL Cholesterol: 65 mg/dL (ref 0–99)
NONHDL: 78.53
Triglycerides: 70 mg/dL (ref 0.0–149.0)
VLDL: 14 mg/dL (ref 0.0–40.0)

## 2017-10-18 LAB — COMPREHENSIVE METABOLIC PANEL
ALT: 13 U/L (ref 0–35)
AST: 14 U/L (ref 0–37)
Albumin: 4 g/dL (ref 3.5–5.2)
Alkaline Phosphatase: 67 U/L (ref 39–117)
BUN: 26 mg/dL — ABNORMAL HIGH (ref 6–23)
CO2: 35 meq/L — AB (ref 19–32)
Calcium: 10.5 mg/dL (ref 8.4–10.5)
Chloride: 100 mEq/L (ref 96–112)
Creatinine, Ser: 0.98 mg/dL (ref 0.40–1.20)
GFR: 59.1 mL/min — AB (ref >60.00)
GLUCOSE: 109 mg/dL — AB (ref 70–99)
Potassium: 3.8 mEq/L (ref 3.5–5.1)
SODIUM: 141 meq/L (ref 135–145)
Total Bilirubin: 0.8 mg/dL (ref 0.2–1.2)
Total Protein: 6.9 g/dL (ref 6.0–8.3)

## 2017-10-18 LAB — HEMOGLOBIN A1C: HEMOGLOBIN A1C: 6.4 % (ref 4.6–6.5)

## 2017-10-18 LAB — URIC ACID: URIC ACID, SERUM: 6 mg/dL (ref 2.4–7.0)

## 2017-10-18 LAB — TSH: TSH: 2.52 u[IU]/mL (ref 0.35–4.50)

## 2017-10-18 NOTE — Progress Notes (Signed)
PCP notes:   Health maintenance:  No gaps identified.  Abnormal screenings:   None  Patient concerns:   Spouse's health is declining and pt is full-time caregiver.   Nurse concerns:  None  Next PCP appt:   10/24/17 @ 1015  I reviewed health advisor's note, was available for consultation, and agree with documentation and plan. Loura Pardon MD

## 2017-10-18 NOTE — Patient Instructions (Signed)
Andrea Peters , Thank you for taking time to come for your Medicare Wellness Visit. I appreciate your ongoing commitment to your health goals. Please review the following plan we discussed and let me know if I can assist you in the future.   These are the goals we discussed: Goals    . Follow up with Primary Care Provider     Starting 10/18/2017, I will continue to take medications as prescribed and to keep appointments with PCP as scheduled.        This is a list of the screening recommended for you and due dates:  Health Maintenance  Topic Date Due  . Mammogram  01/04/2018  . Tetanus Vaccine  01/29/2018  . Colon Cancer Screening  11/14/2023  . Flu Shot  Completed  . DEXA scan (bone density measurement)  Completed  .  Hepatitis C: One time screening is recommended by Center for Disease Control  (CDC) for  adults born from [birth year] through 1967.   Completed  . Pneumonia vaccines  Completed   Preventive Care for Adults  A healthy lifestyle and preventive care can promote health and wellness. Preventive health guidelines for adults include the following key practices.  . A routine yearly physical is a good way to check with your health care provider about your health and preventive screening. It is a chance to share any concerns and updates on your health and to receive a thorough exam.  . Visit your dentist for a routine exam and preventive care every 6 months. Brush your teeth twice a day and floss once a day. Good oral hygiene prevents tooth decay and gum disease.  . The frequency of eye exams is based on your age, health, family medical history, use  of contact lenses, and other factors. Follow your health care provider's recommendations for frequency of eye exams.  . Eat a healthy diet. Foods like vegetables, fruits, whole grains, low-fat dairy products, and lean protein foods contain the nutrients you need without too many calories. Decrease your intake of foods high in solid fats,  added sugars, and salt. Eat the right amount of calories for you. Get information about a proper diet from your health care provider, if necessary.  . Regular physical exercise is one of the most important things you can do for your health. Most adults should get at least 150 minutes of moderate-intensity exercise (any activity that increases your heart rate and causes you to sweat) each week. In addition, most adults need muscle-strengthening exercises on 2 or more days a week.  Silver Sneakers may be a benefit available to you. To determine eligibility, you may visit the website: www.silversneakers.com or contact program at 617-648-6737 Mon-Fri between 8AM-8PM.   . Maintain a healthy weight. The body mass index (BMI) is a screening tool to identify possible weight problems. It provides an estimate of body fat based on height and weight. Your health care provider can find your BMI and can help you achieve or maintain a healthy weight.   For adults 20 years and older: ? A BMI below 18.5 is considered underweight. ? A BMI of 18.5 to 24.9 is normal. ? A BMI of 25 to 29.9 is considered overweight. ? A BMI of 30 and above is considered obese.   . Maintain normal blood lipids and cholesterol levels by exercising and minimizing your intake of saturated fat. Eat a balanced diet with plenty of fruit and vegetables. Blood tests for lipids and cholesterol should begin at age  20 and be repeated every 5 years. If your lipid or cholesterol levels are high, you are over 50, or you are at high risk for heart disease, you may need your cholesterol levels checked more frequently. Ongoing high lipid and cholesterol levels should be treated with medicines if diet and exercise are not working.  . If you smoke, find out from your health care provider how to quit. If you do not use tobacco, please do not start.  . If you choose to drink alcohol, please do not consume more than 2 drinks per day. One drink is  considered to be 12 ounces (355 mL) of beer, 5 ounces (148 mL) of wine, or 1.5 ounces (44 mL) of liquor.  . If you are 57-83 years old, ask your health care provider if you should take aspirin to prevent strokes.  . Use sunscreen. Apply sunscreen liberally and repeatedly throughout the day. You should seek shade when your shadow is shorter than you. Protect yourself by wearing long sleeves, pants, a wide-brimmed hat, and sunglasses year round, whenever you are outdoors.  . Once a month, do a whole body skin exam, using a mirror to look at the skin on your back. Tell your health care provider of new moles, moles that have irregular borders, moles that are larger than a pencil eraser, or moles that have changed in shape or color.

## 2017-10-18 NOTE — Progress Notes (Signed)
Pre visit review using our clinic review tool, if applicable. No additional management support is needed unless otherwise documented below in the visit note. 

## 2017-10-18 NOTE — Progress Notes (Signed)
Subjective:   Andrea Peters is a 72 y.o. female who presents for Medicare Annual (Subsequent) preventive examination.  Review of Systems:  N/A Cardiac Risk Factors include: advanced age (>74men, >25 women);obesity (BMI >30kg/m2);hypertension;dyslipidemia     Objective:     Vitals: BP 114/80 (BP Location: Left Arm, Patient Position: Sitting, Cuff Size: Large)   Pulse 60   Temp 97.8 F (36.6 C) (Oral)   Ht 5' 1.5" (1.562 m) Comment: no shoes  Wt 204 lb (92.5 kg)   SpO2 97%   BMI 37.92 kg/m   Body mass index is 37.92 kg/m.  Advanced Directives 10/18/2017 10/17/2016  Does Patient Have a Medical Advance Directive? Yes Yes  Type of Paramedic of [place];Living will [place];Living will  Copy of [place] in Chart? No - copy requested No - copy requested    Tobacco Social History   Tobacco Use  Smoking Status Never Smoker  Smokeless Tobacco Never Used     Counseling given: No   Clinical Intake:  Pre-visit preparation completed: Yes  Pain : No/denies pain Pain Score: 0-No pain     Nutritional Status: BMI > 30  Obese Nutritional Risks: None Diabetes: No  How often do you need to have someone help you when you read instructions, pamphlets, or other written materials from your doctor or pharmacy?: 1 - Never What is the last grade level you completed in school?: 12th grade  Interpreter Needed?: No  Comments: pt lives with spouse Information entered by :: LPinson, LPN  Past Medical History:  Diagnosis Date  . Allergic rhinitis   . Hyperglycemia   . Hyperlipidemia   . Hypertension   . Melanoma ([place])   . Mixed incontinence   . Obesity   . Plantar fasciitis    with significant disability  . Tendonitis of foot    L, chronic foot pain   Past Surgical History:  Procedure Laterality Date  . ABDOMINAL HYSTERECTOMY     fibroids, ovaries intact  . BREAST BIOPSY Bilateral yrs ago   benign  .  EYE SURGERY Bilateral 12/2015   at [facility]  . TENDON REPAIR  5/07   Tendon rupture L foot   Family History  Problem Relation Age of Onset  . Heart attack Mother   . Other Mother        ?thyroid problems  . Lung cancer Father    Social History   Socioeconomic History  . Marital status: Married    Spouse name: None  . Number of children: None  . Years of education: None  . Highest education level: None  Social Needs  . Financial resource strain: None  . Food insecurity - worry: None  . Food insecurity - inability: None  . Transportation needs - medical: None  . Transportation needs - non-medical: None  Occupational History  . None  Tobacco Use  . Smoking status: Never Smoker  . Smokeless tobacco: Never Used  Substance and Sexual Activity  . Alcohol use: No    Alcohol/week: 0.0 oz  . Drug use: No  . Sexual activity: None  Other Topics Concern  . None  Social History Narrative   Cannot exercise to chronic foot pain    Outpatient Encounter Medications as of 10/18/2017  Medication Sig  . albuterol (PROVENTIL HFA;VENTOLIN HFA) 108 (90 Base) MCG/ACT inhaler Inhale 2 puffs into the lungs every 4 (four) hours as needed for wheezing or shortness of breath (cough, shortness of  breath or wheezing.).  Marland Kitchen allopurinol (ZYLOPRIM) 300 MG tablet Take 1 tablet (300 mg total) by mouth daily.  Marland Kitchen aspirin 81 MG tablet Take 81 mg by mouth daily.    Marland Kitchen CALCIUM-VITAMIN D PO Take 1 tablet by mouth daily.    . fluticasone (FLONASE) 50 MCG/ACT nasal spray PLACE 2 SPRAYS INTO BOTH NOSTRILS DAILY.  . fluticasone furoate-vilanterol (BREO ELLIPTA) 200-25 MCG/INH AEPB Inhale 1 puff into the lungs daily.  Marland Kitchen guaiFENesin (MUCINEX) 600 MG 12 hr tablet Take 1,200 mg by mouth daily.  . hydrochlorothiazide (HYDRODIURIL) 25 MG tablet Take 1 tablet (25 mg total) by mouth daily.  Marland Kitchen KLOR-CON 10 10 MEQ tablet TAKE 1 TABLET EVERY DAY  . losartan-hydrochlorothiazide (HYZAAR) 100-12.5 MG tablet Take 1 tablet by mouth  daily.  . meloxicam (MOBIC) 15 MG tablet Take 1 tablet (15 mg total) by mouth daily.  . Multiple Vitamin (MULTIVITAMIN) tablet Take 2 tablets by mouth daily.   . simvastatin (ZOCOR) 20 MG tablet TAKE 1 TABLET BY MOUTH AT BEDTIME  . [DISCONTINUED] azithromycin (ZITHROMAX) 250 MG tablet Take 2 tabs PO x 1 dose, then 1 tab PO QD x 4 days  . [DISCONTINUED] benzonatate (TESSALON) 100 MG capsule Take 1-2 capsules (100-200 mg total) by mouth 3 (three) times daily as needed for cough.  . [DISCONTINUED] HYDROcodone-homatropine (HYCODAN) 5-1.5 MG/5ML syrup Take 5 mLs by mouth every 8 (eight) hours as needed for cough.  . [DISCONTINUED] predniSONE (DELTASONE) 10 MG tablet Take 5x1,4x1,3x1,2x1,1x1  . colchicine 0.6 MG tablet as needed. Take 2  tablets now with food and then 1 tablet twice a day with food   No facility-administered encounter medications on file as of 10/18/2017.     Activities of Daily Living In your present state of health, do you have any difficulty performing the following activities: 10/18/2017  Hearing? 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 N  Difficulty concentrating or making decisions? N  Walking or climbing stairs? N  Dressing or bathing? N  Doing errands, shopping? N  Preparing Food and eating ? N  Using the Toilet? N  In the past six months, have you accidently leaked urine? Y  Do you have problems with loss of bowel control? N  Managing your Medications? N  Managing your Finances? N  Housekeeping or managing your Housekeeping? N  Some recent data might be hidden    Patient Care Team: Tower, Wynelle Fanny, MD as PCP - General Karle Starch, MD as Referring Physician (Ophthalmology) Andi Devon, MD as Referring Physician (General Surgery) Brendolyn Patty, MD as Consulting Physician (Dermatology) Laverle Hobby, MD as Consulting Physician (Pulmonary Disease)    Assessment:   This is a routine wellness examination for Kharter.  Exercise Activities and  Dietary recommendations Current Exercise Habits: The patient does not participate in regular exercise at present, Exercise limited by: Other - see comments(caregiver responsibilities for spouse)  Goals    . Follow up with Primary Care Provider     Starting 10/18/2017, I will continue to take medications as prescribed and to keep appointments with PCP as scheduled.        Fall Risk Fall Risk  10/18/2017 10/17/2016 10/19/2015 10/15/2014 10/15/2013  Falls in the past year? No No No No Yes  Number falls in past yr: - - - - 1  Injury with Fall? - - - - No   Depression Screen PHQ 2/9 Scores 10/18/2017 10/17/2016 10/19/2015 10/15/2014  PHQ - 2 Score 0 0 0 0  PHQ- 9 Score 0 - - -     Cognitive Function MMSE - Mini Mental State Exam 10/18/2017 10/17/2016  Orientation to time 5 5  Orientation to Place 5 5  Registration 3 3  Attention/ Calculation 0 0  Recall 3 3  Language- name 2 objects 0 0  Language- repeat 1 1  Language- follow 3 step command 3 3  Language- read & follow direction 0 0  Write a sentence 0 0  Copy design 0 0  Total score 20 20     PLEASE NOTE: A Mini-Cog screen was completed. Maximum score is 20. A value of 0 denotes this part of [place] MMSE was not completed or the patient failed this part of the Mini-Cog screening.   Mini-Cog Screening Orientation to Time - Max 5 pts Orientation to Place - Max 5 pts Registration - Max 3 pts Recall - Max 3 pts Language Repeat - Max 1 pts Language Follow 3 Step Command - Max 3 pts    Immunization History  Administered Date(s) Administered  . Influenza,inj,Quad PF,6+ Mos 10/15/2014, 10/19/2015, 08/09/2016, 08/17/2017  . Pneumococcal Conjugate-13 10/15/2014  . Pneumococcal Polysaccharide-23 03/22/2010  . Td 01/13/1998, 01/30/2008  . Zoster 02/05/2008    Screening Tests Health Maintenance  Topic Date Due  . MAMMOGRAM  01/04/2018  . TETANUS/TDAP  01/29/2018  . COLONOSCOPY  11/14/2023  . INFLUENZA VACCINE  Completed  . DEXA  SCAN  Completed  . Hepatitis C Screening  Completed  . PNA vac Low Risk Adult  Completed      Plan:     I have personally reviewed, addressed, and noted the following in the patient's chart:  A. Medical and social history B. Use of alcohol, tobacco or illicit drugs  C. Current medications and supplements D. Functional ability and status E.  Nutritional status F.  Physical activity G. Advance directives H. List of other physicians I.  Hospitalizations, surgeries, and ER visits in previous 12 months J.  [place] to include hearing, vision, cognitive, depression L. Referrals and appointments - none  In addition, I have reviewed and discussed with patient certain preventive protocols, quality metrics, and best practice recommendations. A written personalized care plan for preventive services as well as general preventive health recommendations were provided to patient.  See attached scanned questionnaire for additional information.   Signed,   Lindell Noe, MHA, BS, LPN Health Coach

## 2017-10-19 ENCOUNTER — Ambulatory Visit: Payer: Medicare Other

## 2017-10-24 ENCOUNTER — Encounter: Payer: Self-pay | Admitting: Family Medicine

## 2017-10-24 ENCOUNTER — Ambulatory Visit (INDEPENDENT_AMBULATORY_CARE_PROVIDER_SITE_OTHER): Payer: Medicare Other | Admitting: Family Medicine

## 2017-10-24 VITALS — BP 134/70 | HR 65 | Temp 97.9°F | Ht 61.5 in | Wt 204.8 lb

## 2017-10-24 DIAGNOSIS — I10 Essential (primary) hypertension: Secondary | ICD-10-CM | POA: Diagnosis not present

## 2017-10-24 DIAGNOSIS — M1A9XX0 Chronic gout, unspecified, without tophus (tophi): Secondary | ICD-10-CM

## 2017-10-24 DIAGNOSIS — R739 Hyperglycemia, unspecified: Secondary | ICD-10-CM | POA: Diagnosis not present

## 2017-10-24 DIAGNOSIS — Z1211 Encounter for screening for malignant neoplasm of colon: Secondary | ICD-10-CM

## 2017-10-24 DIAGNOSIS — M8589 Other specified disorders of bone density and structure, multiple sites: Secondary | ICD-10-CM | POA: Diagnosis not present

## 2017-10-24 DIAGNOSIS — E7800 Pure hypercholesterolemia, unspecified: Secondary | ICD-10-CM | POA: Diagnosis not present

## 2017-10-24 MED ORDER — ALLOPURINOL 300 MG PO TABS: 300.0000 mg | ORAL_TABLET | Freq: Every day | ORAL | 3 refills | Status: DC

## 2017-10-24 MED ORDER — SIMVASTATIN 20 MG PO TABS: 20.0000 mg | ORAL_TABLET | Freq: Every day | ORAL | 3 refills | Status: DC

## 2017-10-24 MED ORDER — HYDROCHLOROTHIAZIDE 25 MG PO TABS: 25.0000 mg | ORAL_TABLET | Freq: Every day | ORAL | 3 refills | Status: DC

## 2017-10-24 MED ORDER — LOSARTAN POTASSIUM-HCTZ 100-12.5 MG PO TABS: 1.0000 | ORAL_TABLET | Freq: Every day | ORAL | 3 refills | Status: DC

## 2017-10-24 MED ORDER — POTASSIUM CHLORIDE ER 10 MEQ PO TBCR: 10.0000 meq | EXTENDED_RELEASE_TABLET | Freq: Every day | ORAL | 3 refills | Status: DC

## 2017-10-24 NOTE — Patient Instructions (Addendum)
Aim for 64 oz of fluid per day/ mostly water   You have prediabetes- we need to watch  Weight loss helps Try to get most of your carbohydrates from produce (with the exception of white potatoes)  Eat less bread/pasta/rice/snack foods/cereals/sweets and other items from the middle of the grocery store (processed carbs)  Skip the sweetened drinks

## 2017-10-24 NOTE — Progress Notes (Signed)
Subjective:    Patient ID: Andrea Peters, female    DOB: Nov 28, 1945, 72 y.o.   MRN: [id number]  HPI Here for annual f/u of chronic medical problems   Caring for ill husband at home - goes Monday for heart cath  To [facility]  Pneumonia and HF and kidney failure -he has had a hard time    Wt Readings from Last 3 Encounters:  10/24/17 204 lb 12 oz (92.9 kg)  10/18/17 204 lb (92.5 kg)  10/06/17 208 lb (94.3 kg)  weight is down 4 lb - ? If from stress  Is eating more / has to prep all of her husband's food but- low carb/low carb/low salt  No time for extra exercise  38.06 kg/m   Had amw on 2/13 No concerns   Mammogram 5/18 neg Self breast exam - no lumps or changes   Tetanus shot 5/09  Has had a hysterectomy for fibroids and then ovaries were removed   Zoster status -zostavax 6/09   Colonoscopy 3/15 polyps ([place])  Recall - unsure when (thinks they told her 72 y)  Told her the next time she had one would have to go to the hospital- tortuous colon   dexa 5/18 osteopenia -slightly improved  Up and moving more  Taking ca and D No falls or fractures   bp is stable today  No cp or palpitations or headaches or edema  No side effects to medicines  BP Readings from Last 3 Encounters:  10/24/17 134/70  10/18/17 114/80  10/06/17 138/78     Lab Results  Component Value Date   CREATININE 0.98 10/18/2017   BUN 26 (H) 10/18/2017   NA 141 10/18/2017   K 3.8 10/18/2017   CL 100 10/18/2017   CO2 35 (H) 10/18/2017  does not drink enough water  She drinks sprite  Lab Results  Component Value Date   ALT 13 10/18/2017   AST 14 10/18/2017   ALKPHOS 67 10/18/2017   BILITOT 0.8 10/18/2017   Lab Results  Component Value Date   TSH 2.52 10/18/2017   Lab Results  Component Value Date   WBC 9.8 10/18/2017   HGB 13.3 10/18/2017   HCT 40.1 10/18/2017   MCV 93.8 10/18/2017   PLT 259.0 10/18/2017      Hyperglycemia  Lab Results  Component Value Date   HGBA1C  6.4 10/18/2017  glucose 109  Up from 6.1  Hyperlipidemia Lab Results  Component Value Date   CHOL 130 10/18/2017   CHOL 144 10/17/2016   CHOL 128 10/13/2015   Lab Results  Component Value Date   HDL 51.50 10/18/2017   HDL 62.30 10/17/2016   HDL 45.80 10/13/2015   Lab Results  Component Value Date   LDLCALC 65 10/18/2017   LDLCALC 70 10/17/2016   LDLCALC 68 10/13/2015   Lab Results  Component Value Date   TRIG 70.0 10/18/2017   TRIG 58.0 10/17/2016   TRIG 70.0 10/13/2015   Lab Results  Component Value Date   CHOLHDL 3 10/18/2017   CHOLHDL 2 10/17/2016   CHOLHDL 3 10/13/2015   Lab Results  Component Value Date   LDLDIRECT 141.9 01/23/2007   Simvastatin and diet  Good control   Gout  Allopurinol Lab Results  Component Value Date   LABURIC 6.0 10/18/2017   Up from 4.8  Patient Active Problem List   Diagnosis Date Noted  . Cough 03/29/2016  . Fatigue 03/29/2016  . Hypokalemia 10/19/2015  . Osteopenia  10/19/2015  . Estrogen deficiency 10/15/2014  . Encounter for Medicare annual wellness exam 10/15/2013  . Colon cancer screening 10/15/2013  . Right-sided chest wall pain 10/15/2013  . Leg weakness, bilateral 03/13/2013  . Other screening mammogram 03/30/2011  . Gout 11/17/2010  . CHOLELITHIASIS, HX OF 11/17/2010  . HYPERCHOLESTEROLEMIA, PURE 05/17/2007  . Hyperglycemia 05/17/2007  . ALLERGIC  RHINITIS 01/23/2007  . Essential hypertension 12/20/2006  . [place] 12/20/2006  . URINARY INCONTINENCE, STRESS 12/20/2006   Past Medical History:  Diagnosis Date  . Allergic rhinitis   . Hyperglycemia   . Hyperlipidemia   . Hypertension   . Melanoma ([place])   . Mixed incontinence   . Obesity   . Plantar fasciitis    with significant disability  . Tendonitis of foot    L, chronic foot pain   Past Surgical History:  Procedure Laterality Date  . ABDOMINAL HYSTERECTOMY     fibroids, ovaries intact  . BREAST BIOPSY Bilateral yrs ago   benign    . EYE SURGERY [facility]ilateral 12/2015   at [facility]  . TENDON REPAIR  5/07   Tendon rupture L foot   Social History   Tobacco Use  . Smoking status: Never Smoker  . Smokeless tobacco: Never Used  Substance Use Topics  . Alcohol use: No    Alcohol/week: 0.0 oz  . Drug use: No   Family History  Problem Relation Age of Onset  . Heart attack Mother   . Other Mother        ?thyroid problems  . Lung cancer Father    Allergies  Allergen Reactions  . Lipitor [Atorvastatin]     Muscle pain  . Naproxen Sodium   . Naproxen Other (See Comments) and Rash    Severe indigestion   Current Outpatient Medications on File Prior to Visit  Medication Sig Dispense Refill  . albuterol (PROVENTIL HFA;VENTOLIN HFA) 108 (90 Base) MCG/ACT inhaler Inhale 2 puffs into the lungs every 4 (four) hours as needed for wheezing or shortness of breath (cough, shortness of breath or wheezing.). 1 Inhaler 1  . aspirin 81 MG tablet Take 81 mg by mouth daily.      Marland Kitchen CALCIUM-VITAMIN D PO Take 1 tablet by mouth daily.      . fluticasone (FLONASE) 50 MCG/ACT nasal spray PLACE 2 SPRAYS INTO BOTH NOSTRILS DAILY. 48 g 0  . fluticasone furoate-vilanterol (BREO ELLIPTA) 200-25 MCG/INH AEPB Inhale 1 puff into the lungs daily. 120 each 3  . guaiFENesin (MUCINEX) 600 MG 12 hr tablet Take 1,200 mg by mouth daily.    . meloxicam (MOBIC) 15 MG tablet Take 1 tablet (15 mg total) by mouth daily. 90 tablet 0  . Multiple Vitamin (MULTIVITAMIN) tablet Take 2 tablets by mouth daily.     . colchicine 0.6 MG tablet as needed. Take 2  tablets now with food and then 1 tablet twice a day with food     No current facility-administered medications on file prior to visit.     Review of Systems  Constitutional: Positive for fatigue. Negative for activity change, appetite change, fever and unexpected weight change.  HENT: Negative for congestion, ear pain, rhinorrhea, sinus pressure and sore throat.   Eyes: Negative for pain, redness and visual  disturbance.  Respiratory: Negative for cough, shortness of breath and wheezing.   Cardiovascular: Negative for chest pain and palpitations.  Gastrointestinal: Negative for abdominal pain, blood in stool, constipation and diarrhea.  Endocrine: Negative for polydipsia and polyuria.  Genitourinary: Negative for dysuria, frequency and urgency.  Musculoskeletal: Negative for arthralgias, back pain and myalgias.  Skin: Negative for pallor and rash.  Allergic/Immunologic: Negative for environmental allergies.  Neurological: Negative for dizziness, syncope and headaches.  Hematological: Negative for adenopathy. Does not bruise/bleed easily.  Psychiatric/Behavioral: Negative for decreased concentration and dysphoric mood. The patient is not nervous/anxious.        Pos for stressors-caring for her sick husband       Objective:   Physical Exam  Constitutional: She appears well-developed and well-nourished. No distress.  obese and well appearing   HENT:  Head: Normocephalic and atraumatic.  Right Ear: External ear normal.  Left Ear: External ear normal.  Mouth/Throat: Oropharynx is clear and moist.  Eyes: Conjunctivae and EOM are normal. Pupils are equal, round, and reactive to light. No scleral icterus.  Neck: Normal range of motion. Neck supple. No JVD present. Carotid bruit is not present. No thyromegaly present.  Cardiovascular: Normal rate, regular rhythm, normal heart sounds and intact distal pulses. Exam reveals no gallop.  Pulmonary/Chest: Effort normal and breath sounds normal. No respiratory distress. She has no wheezes. She exhibits no tenderness.  Abdominal: Soft. Bowel sounds are normal. She exhibits no distension, no abdominal bruit and no mass. There is no tenderness.  Genitourinary: No breast swelling, tenderness, discharge or bleeding.  Genitourinary Comments: Breast exam: No mass, nodules, thickening, tenderness, bulging, retraction, inflamation, nipple discharge or skin changes  noted.  No axillary or clavicular LA.      Musculoskeletal: Normal range of motion. She exhibits no edema or tenderness.  Lymphadenopathy:    She has no cervical adenopathy.  Neurological: She is alert. She has normal reflexes. No cranial nerve deficit. She exhibits normal muscle tone. Coordination normal.  Skin: Skin is warm and dry. No rash noted. No erythema. No pallor.  Solar lentigines diffusely  Some sks   Psychiatric: She has a normal mood and affect.  Seems fatigued but in good spirits          Assessment & Plan:   Problem List Items Addressed This Visit      Cardiovascular and Mediastinum   Essential hypertension - Primary    bp in fair control at this time  BP Readings from Last 1 Encounters:  10/24/17 134/70   No changes needed Disc lifstyle change with low sodium diet and exercise  Labs reviewed  Counseled on water intake      Relevant Medications   hydrochlorothiazide (HYDRODIURIL) 25 MG tablet   losartan-hydrochlorothiazide (HYZAAR) 100-12.5 MG tablet   simvastatin (ZOCOR) 20 MG tablet     Musculoskeletal and Integument   Osteopenia    5/18 dexa osteopenia slt improved  Needs exercise- though is more active Disc need for calcium/ vitamin D/ wt bearing exercise and bone density test every 2 y to monitor Disc safety/ fracture risk in detail          Other   Colon cancer screening    colonoscop 2015- per pt she had polyps but thinks recall was 10 y  No stool changes       Gout    Controlled with allopurinol Lab Results  Component Value Date   LABURIC 6.0 10/18/2017         HYPERCHOLESTEROLEMIA, PURE    Disc goals for lipids and reasons to control them Rev labs with pt Rev low sat fat diet in detail Controlled with simvastatin and diet      Relevant Medications   hydrochlorothiazide (HYDRODIURIL)  25 MG tablet   losartan-hydrochlorothiazide (HYZAAR) 100-12.5 MG tablet   simvastatin (ZOCOR) 20 MG tablet   Hyperglycemia    Lab Results    Component Value Date   HGBA1C 6.4 10/18/2017   This is up  Disc prediabetes disc imp of low glycemic diet and wt loss to prevent DM2  Handouts given Will change sodas to water Get carbs from produce and eat less sweets

## 2017-10-25 NOTE — Assessment & Plan Note (Signed)
5/18 dexa osteopenia slt improved  Needs exercise- though is more active Disc need for calcium/ vitamin D/ wt bearing exercise and bone density test every 2 y to monitor Disc safety/ fracture risk in detail

## 2017-10-25 NOTE — Assessment & Plan Note (Signed)
Disc goals for lipids and reasons to control them Rev labs with pt Rev low sat fat diet in detail Controlled with simvastatin and diet

## 2017-10-25 NOTE — Assessment & Plan Note (Signed)
Lab Results  Component Value Date   HGBA1C 6.4 10/18/2017   This is up  Disc prediabetes disc imp of low glycemic diet and wt loss to prevent DM2  Handouts given Will change sodas to water Get carbs from produce and eat less sweets

## 2017-10-25 NOTE — Assessment & Plan Note (Signed)
colonoscop 2015- per pt she had polyps but thinks recall was 10 y  No stool changes

## 2017-10-25 NOTE — Assessment & Plan Note (Signed)
Controlled with allopurinol Lab Results  Component Value Date   LABURIC 6.0 10/18/2017

## 2017-10-25 NOTE — Assessment & Plan Note (Signed)
bp in fair control at this time  BP Readings from Last 1 Encounters:  10/24/17 134/70   No changes needed Disc lifstyle change with low sodium diet and exercise  Labs reviewed  Counseled on water intake

## 2017-12-27 ENCOUNTER — Other Ambulatory Visit: Payer: Self-pay | Admitting: *Deleted

## 2017-12-27 MED ORDER — FLUTICASONE PROPIONATE 50 MCG/ACT NA SUSP: NASAL | 1 refills | Status: DC

## 2017-12-27 MED ORDER — MELOXICAM 15 MG PO TABS: 15.0000 mg | ORAL_TABLET | Freq: Every day | ORAL | 1 refills | Status: DC

## 2017-12-27 NOTE — Telephone Encounter (Signed)
CPE scheduled 10/26/18 both last filled on 09/26/17 #90, please advise

## 2018-01-09 DIAGNOSIS — C4361 Malignant melanoma of right upper limb, including shoulder: Secondary | ICD-10-CM | POA: Diagnosis not present

## 2018-03-01 ENCOUNTER — Telehealth: Payer: Self-pay | Admitting: Family Medicine

## 2018-03-01 NOTE — Telephone Encounter (Signed)
Copied from [place] [id number]. Topic: General - Other >> Mar 01, 2018  5:26 PM Cecelia Byars, NT wrote: Reason for CRM: Patient says she thinks she has may have arthritis, she says it feels like the bone are crushing together after driving ,on the outer foot on the top of her foot and would like to know who her pcp would suggest she see for this issue , please call her at  [phone]

## 2018-03-02 ENCOUNTER — Other Ambulatory Visit: Payer: Self-pay | Admitting: Family Medicine

## 2018-03-02 DIAGNOSIS — Z1231 Encounter for screening mammogram for malignant neoplasm of breast: Secondary | ICD-10-CM

## 2018-03-02 NOTE — Telephone Encounter (Signed)
Please make her an appt with Dr Lorelei Pont [facility]  Thanks

## 2018-03-02 NOTE — Telephone Encounter (Signed)
I spoke to patient and she scheduled appointment with Dr.Copland on 03/14/18.

## 2018-03-13 ENCOUNTER — Encounter: Payer: Self-pay | Admitting: *Deleted

## 2018-03-13 NOTE — Progress Notes (Signed)
Dr. Frederico Hamman T. Maahi Lannan, MD, [facility] [place], [address] Phone: [phone] Fax: [phone]  03/14/2018  Patient: Andrea Peters, MRN: [id number], DOB: 1945/10/03, 72 y.o.  Primary Physician:  Tower, Wynelle Fanny, MD   Chief Complaint  Patient presents with  . Foot Pain    right, for couple of months   Subjective:   Andrea Peters is a 72 y.o. very pleasant female patient who presents with the following:  Very nice patient who is concerned she may have some arthritis in her foot and is referred to see me for evaluation by Dr. Glori Bickers.  Hard to walk and get onto it after getting out of the car. Then will sit and could walk with it and doing a little bit better now.  At this point the patient's symptoms have improved a great deal, and she is really not symptomatic.   Past Medical History, Surgical History, Social History, Family History, Problem List, Medications, and Allergies have been reviewed and updated if relevant.  Patient Active Problem List   Diagnosis Date Noted  . Fatigue 03/29/2016  . Hypokalemia 10/19/2015  . Osteopenia 10/19/2015  . Estrogen deficiency 10/15/2014  . Encounter for Medicare annual wellness exam 10/15/2013  . Colon cancer screening 10/15/2013  . Melanoma of upper arm ([place]) 05/08/2013  . Gout 11/17/2010  . CHOLELITHIASIS, HX OF 11/17/2010  . HYPERCHOLESTEROLEMIA, PURE 05/17/2007  . Hyperglycemia 05/17/2007  . ALLERGIC  RHINITIS 01/23/2007  . Essential hypertension 12/20/2006  . [place], [place] 12/20/2006  . URINARY INCONTINENCE, STRESS 12/20/2006    Past Medical History:  Diagnosis Date  . Allergic rhinitis   . Hyperglycemia   . Hyperlipidemia   . Hypertension   . Melanoma ([place])   . Mixed incontinence   . Obesity   . Plantar fasciitis    with significant disability  . Tendonitis of foot    L, chronic foot pain    Past Surgical History:  Procedure Laterality  Date  . ABDOMINAL HYSTERECTOMY     fibroids, ovaries intact  . BREAST BIOPSY Bilateral yrs ago   benign  . EYE SURGERY Bilateral 12/2015   at [facility]  . TENDON REPAIR  5/07   Tendon rupture L foot    Social History   Socioeconomic History  . Marital status: Married    Spouse name: Not on file  . Number of children: Not on file  . Years of education: Not on file  . Highest education level: Not on file  Occupational History  . Not on file  Social Needs  . Financial resource strain: Not on file  . Food insecurity:    Worry: Not on file    Inability: Not on file  . Transportation needs:    Medical: Not on file    Non-medical: Not on file  Tobacco Use  . Smoking status: Never Smoker  . Smokeless tobacco: Never Used  Substance and Sexual Activity  . Alcohol use: No    Alcohol/week: 0.0 oz  . Drug use: No  . Sexual activity: Not on file  Lifestyle  . Physical activity:    Days per week: Not on file    Minutes per session: Not on file  . Stress: Not on file  Relationships  . Social connections:    Talks on phone: Not on file    Gets together: Not on file    Attends religious service: Not on file  Active member of club or organization: Not on file    Attends meetings of clubs or organizations: Not on file    Relationship status: Not on file  . Intimate partner violence:    Fear of current or ex partner: Not on file    Emotionally abused: Not on file    Physically abused: Not on file    Forced sexual activity: Not on file  Other Topics Concern  . Not on file  Social History Narrative   Cannot exercise to chronic foot pain    Family History  Problem Relation Age of Onset  . Heart attack Mother   . Other Mother        ?thyroid problems  . Lung cancer Father     Allergies  Allergen Reactions  . Lipitor [Atorvastatin]     Muscle pain  . Naproxen Sodium   . Naproxen Other (See Comments) and Rash    Severe indigestion    Medication list reviewed and updated  in full in [place].  GEN: No fevers, chills. Nontoxic. Primarily MSK c/o today. MSK: Detailed in the HPI GI: tolerating PO intake without difficulty Neuro: No numbness, parasthesias, or tingling associated. Otherwise the pertinent positives of the ROS are noted above.   Objective:   BP (!) 148/80   Pulse 62   Temp 97.9 F (36.6 C) (Oral)   Ht 5' 1.5" (1.562 m)   Wt 205 lb (93 kg)   BMI 38.11 kg/m    GEN: Well-developed,well-nourished,in no acute distress; alert,appropriate and cooperative throughout examination HEENT: Normocephalic and atraumatic without obvious abnormalities. Ears, externally no deformities PULM: Breathing comfortably in no respiratory distress EXT: No clubbing, cyanosis, or edema PSYCH: Normally interactive. Cooperative during the interview. Pleasant. Friendly and conversant. Not anxious or depressed appearing. Normal, full affect.  On the right foot there globally is some pain primarily in the midfoot, there is only minor.  Entirety of the forefoot and hindfoot as well as the ankle are all nontender.  Patient does have a prominent bunion with notable loss of motion at the great toe.  Radiology: Dg Foot Complete Right  Result Date: 03/14/2018 CLINICAL DATA:  Right foot pain EXAM: RIGHT FOOT COMPLETE - 3+ VIEW COMPARISON:  None. FINDINGS: There is no evidence of fracture or dislocation. There is mild osteoarthritis of the first MTP joint. There is a plantar calcaneal spur. The soft tissues are normal. IMPRESSION: No acute osseous injury of the right foot. Electronically Signed   By: Kathreen Devoid   On: 03/14/2018 15:09    Assessment and Plan:   Achilles tendonosis  Right foot pain - Plan: DG Foot Complete Right  Reassurance, sx resolved. Reviewed OA care  Patient Instructions  Aspercreme or Salonpas or generic  4% lidocaine    Follow-up: No follow-ups on file.  Orders Placed This Encounter  Procedures  . DG Foot Complete Right     Signed,  Gray Doering T. Colin Ellers, MD   Allergies as of 03/14/2018      Reactions   Lipitor [atorvastatin]    Muscle pain   Naproxen Sodium    Naproxen Other (See Comments), Rash   Severe indigestion      Medication List        Accurate as of 03/14/18 11:59 PM. Always use your most recent med list.          albuterol 108 (90 Base) MCG/ACT inhaler Commonly known as:  PROVENTIL HFA;VENTOLIN HFA Inhale 2 puffs into the lungs every  4 (four) hours as needed for wheezing or shortness of breath (cough, shortness of breath or wheezing.).   allopurinol 300 MG tablet Commonly known as:  ZYLOPRIM Take 1 tablet (300 mg total) by mouth daily.   aspirin 81 MG tablet Take 81 mg by mouth daily.   CALCIUM-VITAMIN D PO Take 1 tablet by mouth daily.   colchicine 0.6 MG tablet as needed. Take 2  tablets now with food and then 1 tablet twice a day with food   fluticasone 50 MCG/ACT nasal spray Commonly known as:  FLONASE PLACE 2 SPRAYS INTO BOTH NOSTRILS DAILY.   fluticasone furoate-vilanterol 200-25 MCG/INH Aepb Commonly known as:  BREO ELLIPTA Inhale 1 puff into the lungs daily.   guaiFENesin 600 MG 12 hr tablet Commonly known as:  MUCINEX Take 1,200 mg by mouth daily.   hydrochlorothiazide 25 MG tablet Commonly known as:  HYDRODIURIL Take 1 tablet (25 mg total) by mouth daily.   losartan-hydrochlorothiazide 100-12.5 MG tablet Commonly known as:  HYZAAR Take 1 tablet by mouth daily.   meloxicam 15 MG tablet Commonly known as:  MOBIC Take 1 tablet (15 mg total) by mouth daily.   multivitamin tablet Take 2 tablets by mouth daily.   potassium chloride 10 MEQ tablet Commonly known as:  KLOR-CON 10 Take 1 tablet (10 mEq total) by mouth daily.   simvastatin 20 MG tablet Commonly known as:  ZOCOR Take 1 tablet (20 mg total) by mouth at bedtime.

## 2018-03-14 ENCOUNTER — Ambulatory Visit (INDEPENDENT_AMBULATORY_CARE_PROVIDER_SITE_OTHER): Payer: Medicare Other | Admitting: Family Medicine

## 2018-03-14 ENCOUNTER — Encounter: Payer: Self-pay | Admitting: Family Medicine

## 2018-03-14 ENCOUNTER — Ambulatory Visit (INDEPENDENT_AMBULATORY_CARE_PROVIDER_SITE_OTHER)
Admission: RE | Admit: 2018-03-14 | Discharge: 2018-03-14 | Disposition: A | Discharge status: Home / Self Care | Payer: Medicare Other | Source: Ambulatory Visit | Attending: Family Medicine | Admitting: Family Medicine

## 2018-03-14 VITALS — BP 148/80 | HR 62 | Temp 97.9°F | Ht 61.5 in | Wt 205.0 lb

## 2018-03-14 DIAGNOSIS — M19071 Primary osteoarthritis, right ankle and foot: Secondary | ICD-10-CM | POA: Diagnosis not present

## 2018-03-14 DIAGNOSIS — M79671 Pain in right foot: Secondary | ICD-10-CM

## 2018-03-14 NOTE — Patient Instructions (Addendum)
Aspercreme or Salonpas or generic  4% lidocaine

## 2018-03-15 ENCOUNTER — Encounter: Payer: Self-pay | Admitting: Family Medicine

## 2018-03-23 ENCOUNTER — Encounter: Payer: Self-pay | Admitting: Family Medicine

## 2018-03-23 ENCOUNTER — Ambulatory Visit
Admission: RE | Admit: 2018-03-23 | Discharge: 2018-03-23 | Disposition: A | Discharge status: Home / Self Care | Payer: Medicare Other | Source: Ambulatory Visit | Attending: Family Medicine | Admitting: Family Medicine

## 2018-03-23 DIAGNOSIS — Z1231 Encounter for screening mammogram for malignant neoplasm of breast: Secondary | ICD-10-CM

## 2018-03-23 LAB — HM MAMMOGRAPHY

## 2018-04-18 ENCOUNTER — Other Ambulatory Visit: Payer: Self-pay | Admitting: Family Medicine

## 2018-04-18 MED ORDER — FLUTICASONE FUROATE-VILANTEROL 200-25 MCG/INH IN AEPB: 1.0000 | INHALATION_SPRAY | Freq: Every day | RESPIRATORY_TRACT | 3 refills | Status: DC

## 2018-04-18 NOTE — Telephone Encounter (Signed)
I sent px to [facility] as req

## 2018-04-18 NOTE — Telephone Encounter (Signed)
Copied from [place] [id number]. Topic: Quick Communication - Rx Refill/Question >> Apr 18, 2018  9:32 AM Judyann Munson wrote: Medication: fluticasone furoate-vilanterol (BREO ELLIPTA) 200-25 MCG/INH AEPB  Has the patient contacted their pharmacy? no  Preferred Pharmacy (with phone number or street name): [facility] [place], [place] [facility] [phone] (Phone) [phone] (Fax)   Patient is needing Print copy  of this medication, she stated she will be in the office on Friday 04-20-18, she can pick it up then. Please advise

## 2018-04-18 NOTE — Telephone Encounter (Signed)
Breo last filled by Dr Ashby Dawes; pt last saw Dr Ashby Dawes 05/19/16; pt last annual on 10/24/17.Please advise. Per note pt wants printed Breo rx and pt will pick up on 04/20/18.Please advise.

## 2018-05-14 ENCOUNTER — Other Ambulatory Visit: Payer: Self-pay | Admitting: *Deleted

## 2018-05-14 ENCOUNTER — Telehealth: Payer: Self-pay | Admitting: Family Medicine

## 2018-05-14 MED ORDER — FLUTICASONE FUROATE-VILANTEROL 200-25 MCG/INH IN AEPB: 1.0000 | INHALATION_SPRAY | Freq: Every day | RESPIRATORY_TRACT | 3 refills | Status: DC

## 2018-05-14 NOTE — Telephone Encounter (Signed)
Pt dropped off rx form. Placed in rx tower.

## 2018-05-14 NOTE — Telephone Encounter (Signed)
reviewed note at [facility] was sent with a 2 month supply instead of 3 months. Rx re-sent for 3 month supply and called [facility] and cancelled the 2 month Rx

## 2018-06-01 ENCOUNTER — Other Ambulatory Visit: Payer: Self-pay | Admitting: Family Medicine

## 2018-06-19 ENCOUNTER — Other Ambulatory Visit: Payer: Self-pay | Admitting: *Deleted

## 2018-08-27 ENCOUNTER — Other Ambulatory Visit: Payer: Self-pay | Admitting: *Deleted

## 2018-08-27 MED ORDER — MELOXICAM 15 MG PO TABS: 15.0000 mg | ORAL_TABLET | Freq: Every day | ORAL | 0 refills | Status: DC | PRN

## 2018-08-27 MED ORDER — FLUTICASONE PROPIONATE 50 MCG/ACT NA SUSP: NASAL | 3 refills | Status: DC

## 2018-08-27 NOTE — Telephone Encounter (Signed)
CPE scheduled on 10/26/18, meloxicam last filled on 12/27/17 #90 tabs with 0 refills, flonase also due for refill, please advise   [facility] [address]

## 2018-09-12 ENCOUNTER — Other Ambulatory Visit: Payer: Self-pay | Admitting: *Deleted

## 2018-09-12 MED ORDER — TELMISARTAN-HCTZ 80-12.5 MG PO TABS: 1.0000 | ORAL_TABLET | Freq: Every day | ORAL | 0 refills | Status: DC

## 2018-10-04 ENCOUNTER — Encounter: Payer: Self-pay | Admitting: Family Medicine

## 2018-10-04 ENCOUNTER — Ambulatory Visit (INDEPENDENT_AMBULATORY_CARE_PROVIDER_SITE_OTHER): Payer: Medicare Other | Admitting: Family Medicine

## 2018-10-04 VITALS — BP 152/92 | HR 63 | Temp 98.1°F | Ht 61.5 in | Wt 204.5 lb

## 2018-10-04 DIAGNOSIS — H00012 Hordeolum externum right lower eyelid: Secondary | ICD-10-CM | POA: Diagnosis not present

## 2018-10-04 DIAGNOSIS — Z23 Encounter for immunization: Secondary | ICD-10-CM

## 2018-10-04 DIAGNOSIS — I10 Essential (primary) hypertension: Secondary | ICD-10-CM

## 2018-10-04 DIAGNOSIS — R21 Rash and other nonspecific skin eruption: Secondary | ICD-10-CM | POA: Diagnosis not present

## 2018-10-04 NOTE — Assessment & Plan Note (Signed)
Advised warm compresses. If increasing in size or not resolving let us know and will plan referral to Ophthalmology.

## 2018-10-04 NOTE — Progress Notes (Signed)
Subjective:     KESHANNA RISO is a 73 y.o. female presenting for Rash (in front of her chest. Started less than 1 week ago. Has applied Cortaid cream and rash is better. Did have some blistering. Looks better today. No fever. Has had chills for 2 weeks)     HPI   #Rash - anterior chest - start 1 week ago - cortisone cream w/ help - not itchy - trying not to scratch it - was blistery - but improved now - not painful  Eye itching x 1 week  No blurry vision No double vision  Review of Systems  Constitutional: Positive for chills. Negative for fever.  HENT: Negative.   Eyes: Positive for pain (under the eye) and redness. Negative for itching.  Gastrointestinal: Negative for abdominal pain.  Skin: Positive for rash. Negative for wound.     Social History   Tobacco Use  Smoking Status Never Smoker  Smokeless Tobacco Never Used        Objective:    BP Readings from Last 3 Encounters:  10/04/18 (!) 152/92  03/14/18 (!) 148/80  10/24/17 134/70   Wt Readings from Last 3 Encounters:  10/04/18 204 lb 8 oz (92.8 kg)  03/14/18 205 lb (93 kg)  10/24/17 204 lb 12 oz (92.9 kg)    BP (!) 152/92   Pulse 63   Temp 98.1 F (36.7 C)   Ht 5' 1.5" (1.562 m)   Wt 204 lb 8 oz (92.8 kg)   SpO2 97%   BMI 38.01 kg/m    Physical Exam Constitutional:      General: She is not in acute distress.    Appearance: She is well-developed. She is not diaphoretic.  HENT:     Right Ear: External ear normal.     Left Ear: External ear normal.     Nose: Nose normal.  Eyes:     General:        Right eye: Hordeolum (lower lid) present.     Extraocular Movements: Extraocular movements intact.     Conjunctiva/sclera:     Right eye: Right conjunctiva is injected.  Neck:     Musculoskeletal: Neck supple.  Cardiovascular:     Rate and Rhythm: Normal rate.  Pulmonary:     Effort: Pulmonary effort is normal.  Skin:    General: Skin is warm and dry.     Capillary Refill:  Capillary refill takes less than 2 seconds.     Comments: Several erythematous patches on the upper chest below the neck. No scale or blisters.   Neurological:     Mental Status: She is alert. Mental status is at baseline.  Psychiatric:        Mood and Affect: Mood normal.        Behavior: Behavior normal.           Assessment & Plan:   Problem List Items Addressed This Visit      Cardiovascular and Mediastinum   Essential hypertension    BP mildly elevated. Has follow-up with PCP in 3 weeks. Will route as FYI.         Musculoskeletal and Integument   Skin rash - Primary    Suspect some kind of dermatitis given improvement with steroid and crossing the midline. Continue steroids and return if not improving.         Other   Hordeolum externum of right lower eyelid    Advised warm compresses. If increasing in size  or not resolving let us know and will plan referral to Ophthalmology.        Other Visit Diagnoses    Need for influenza vaccination           Return if symptoms worsen or fail to improve.  Lesleigh Noe, MD

## 2018-10-04 NOTE — Patient Instructions (Addendum)
Skin rash - continue using the cortisone cream - if not improvement in 2-3 weeks, let me know and we can try a stronger treatment   Stye  A stye, also known as a hordeolum, is a bump that forms on an eyelid. It may look like a pimple next to the eyelash. A stye can form inside the eyelid (internal stye) or outside the eyelid (external stye). A stye can cause redness, swelling, and pain on the eyelid. Styes are very common. Anyone can get them at any age. They usually occur in just one eye, but you may have more than one in either eye. What are the causes? A stye is caused by an infection. The infection is almost always caused by bacteria called Staphylococcus aureus. This is a common type of bacteria that lives on the skin. An internal stye may result from an infected oil-producing gland inside the eyelid. An external stye may be caused by an infection at the base of the eyelash (hair follicle). What increases the risk? You are more likely to develop a stye if:  You have had a stye before.  You have any of these conditions: ? Diabetes. ? Red, itchy, inflamed eyelids (blepharitis). ? A skin condition such as seborrheic dermatitis or rosacea. ? High fat levels in your blood (lipids). What are the signs or symptoms? The most common symptom of a stye is eyelid pain. Internal styes are more painful than external styes. Other symptoms may include:  Painful swelling of your eyelid.  A scratchy feeling in your eye.  Tearing and redness of your eye.  Pus draining from the stye. How is this diagnosed? Your health care provider may be able to diagnose a stye just by examining your eye. The health care provider may also check to make sure:  You do not have a fever or other signs of a more serious infection.  The infection has not spread to other parts of your eye or areas around your eye. How is this treated? Most styes will clear up in a few days without treatment or with warm compresses  applied to the area. You may need to use antibiotic drops or ointment to treat an infection. In some cases, if your stye does not heal with routine treatment, your health care provider may drain pus from the stye using a thin blade or needle. This may be done if the stye is large, causing a lot of pain, or affecting your vision. Follow these instructions at home:  Take over-the-counter and prescription medicines only as told by your health care provider. This includes eye drops or ointments.  If you were prescribed an antibiotic medicine, apply or use it as told by your health care provider. Do not stop using the antibiotic even if your condition improves.  Apply a warm, wet cloth (warm compress) to your eye for 5-10 minutes, 4 times a day.  Clean the affected eyelid as directed by your health care provider.  Do not wear contact lenses or eye makeup until your stye has healed.  Do not try to pop or drain the stye.  Do not rub your eye. Contact a health care provider if:  You have chills or a fever.  Your stye does not go away after several days.  Your stye affects your vision.  Your eyeball becomes swollen, red, or painful. Get help right away if:  You have pain when moving your eye around. Summary  A stye is a bump that forms on  an eyelid. It may look like a pimple next to the eyelash.  A stye can form inside the eyelid (internal stye) or outside the eyelid (external stye). A stye can cause redness, swelling, and pain on the eyelid.  Your health care provider may be able to diagnose a stye just by examining your eye.  Apply a warm, wet cloth (warm compress) to your eye for 5-10 minutes, 4 times a day. This information is not intended to replace advice given to you by your health care provider. Make sure you discuss any questions you have with your health care provider. Document Released: 06/01/2005 Document Revised: 05/04/2017 Document Reviewed: 05/04/2017 Elsevier  Interactive Patient Education  2019 [employer].

## 2018-10-04 NOTE — Assessment & Plan Note (Signed)
Suspect some kind of dermatitis given improvement with steroid and crossing the midline. Continue steroids and return if not improving.

## 2018-10-04 NOTE — Assessment & Plan Note (Signed)
BP mildly elevated. Has follow-up with PCP in 3 weeks. Will route as FYI.

## 2018-10-22 ENCOUNTER — Ambulatory Visit: Payer: Medicare Other

## 2018-10-23 ENCOUNTER — Ambulatory Visit (INDEPENDENT_AMBULATORY_CARE_PROVIDER_SITE_OTHER): Payer: Medicare Other

## 2018-10-23 VITALS — BP 144/90 | HR 69 | Temp 97.9°F | Ht 61.25 in | Wt 204.4 lb

## 2018-10-23 DIAGNOSIS — M1000 Idiopathic gout, unspecified site: Secondary | ICD-10-CM | POA: Diagnosis not present

## 2018-10-23 DIAGNOSIS — Z0000 Encounter for general adult medical examination without abnormal findings: Secondary | ICD-10-CM | POA: Diagnosis not present

## 2018-10-23 DIAGNOSIS — E7800 Pure hypercholesterolemia, unspecified: Secondary | ICD-10-CM | POA: Diagnosis not present

## 2018-10-23 DIAGNOSIS — I10 Essential (primary) hypertension: Secondary | ICD-10-CM | POA: Diagnosis not present

## 2018-10-23 DIAGNOSIS — R739 Hyperglycemia, unspecified: Secondary | ICD-10-CM

## 2018-10-23 LAB — CBC WITH DIFFERENTIAL/PLATELET
BASOS PCT: 0.6 % (ref 0.0–3.0)
Basophils Absolute: 0.1 10*3/uL (ref 0.0–0.1)
EOS PCT: 2.8 % (ref 0.0–5.0)
Eosinophils Absolute: 0.3 10*3/uL (ref 0.0–0.7)
HCT: 39.6 % (ref 36.0–46.0)
Hemoglobin: 13 g/dL (ref 12.0–15.0)
Lymphocytes Relative: 17.2 % (ref 12.0–46.0)
Lymphs Abs: 1.5 10*3/uL (ref 0.7–4.0)
MCHC: 32.9 g/dL (ref 30.0–36.0)
MCV: 94.3 fl (ref 78.0–100.0)
Monocytes Absolute: 0.6 10*3/uL (ref 0.1–1.0)
Monocytes Relative: 7.1 % (ref 3.0–12.0)
NEUTROS ABS: 6.5 10*3/uL (ref 1.4–7.7)
Neutrophils Relative %: 72.3 % (ref 43.0–77.0)
PLATELETS: 290 10*3/uL (ref 150.0–400.0)
RBC: 4.19 Mil/uL (ref 3.87–5.11)
RDW: 14.4 % (ref 11.5–15.5)
WBC: 9 10*3/uL (ref 4.0–10.5)

## 2018-10-23 LAB — COMPREHENSIVE METABOLIC PANEL
ALBUMIN: 4.3 g/dL (ref 3.5–5.2)
ALT: 16 U/L (ref 0–35)
AST: 13 U/L (ref 0–37)
Alkaline Phosphatase: 76 U/L (ref 39–117)
BUN: 24 mg/dL — ABNORMAL HIGH (ref 6–23)
CHLORIDE: 98 meq/L (ref 96–112)
CO2: 34 meq/L — AB (ref 19–32)
CREATININE: 1 mg/dL (ref 0.40–1.20)
Calcium: 11.1 mg/dL — ABNORMAL HIGH (ref 8.4–10.5)
GFR: 54.17 mL/min — ABNORMAL LOW (ref >60.00)
Glucose, Bld: 100 mg/dL — ABNORMAL HIGH (ref 70–99)
POTASSIUM: 4.2 meq/L (ref 3.5–5.1)
SODIUM: 139 meq/L (ref 135–145)
Total Bilirubin: 0.8 mg/dL (ref 0.2–1.2)
Total Protein: 7.1 g/dL (ref 6.0–8.3)

## 2018-10-23 LAB — LIPID PANEL
CHOL/HDL RATIO: 2
Cholesterol: 147 mg/dL (ref 0–200)
HDL: 59.1 mg/dL (ref >39.00)
LDL Cholesterol: 74 mg/dL (ref 0–99)
NONHDL: 87.44
Triglycerides: 69 mg/dL (ref 0.0–149.0)
VLDL: 13.8 mg/dL (ref 0.0–40.0)

## 2018-10-23 LAB — URIC ACID: Uric Acid, Serum: 5.4 mg/dL (ref 2.4–7.0)

## 2018-10-23 LAB — TSH: TSH: 2.84 u[IU]/mL (ref 0.35–4.50)

## 2018-10-23 LAB — HEMOGLOBIN A1C: HEMOGLOBIN A1C: 6.2 % (ref 4.6–6.5)

## 2018-10-23 NOTE — Progress Notes (Signed)
Subjective:   Andrea Peters is a 73 y.o. female who presents for Medicare Annual (Subsequent) preventive examination.  Review of Systems:  N/A Cardiac Risk Factors include: advanced age (>52men, >37 women);obesity (BMI >30kg/m2);hypertension;dyslipidemia     Objective:     Vitals: BP (!) 144/90 (BP Location: Left Arm, Patient Position: Sitting, Cuff Size: Normal)   Pulse 69   Temp 97.9 F (36.6 C) (Oral)   Ht 5' 1.25" (1.556 m) Comment: no shoes  Wt 204 lb 6.4 oz (92.7 kg)   SpO2 93%   BMI 38.31 kg/m   Body mass index is 38.31 kg/m.  Advanced Directives 10/23/2018 10/18/2017 10/17/2016  Does Patient Have a Medical Advance Directive? Yes Yes Yes  Type of Paramedic of [place];Living will [place];Living will [place];Living will  Copy of [place] in Chart? - No - copy requested No - copy requested    Tobacco Social History   Tobacco Use  Smoking Status Never Smoker  Smokeless Tobacco Never Used     Counseling given: No   Clinical Intake:  Pre-visit preparation completed: Yes  Pain : No/denies pain Pain Score: 0-No pain     Nutritional Status: BMI > 30  Obese Nutritional Risks: None Diabetes: No  How often do you need to have someone help you when you read instructions, pamphlets, or other written materials from your doctor or pharmacy?: 1 - Never  Interpreter Needed?: No  Comments: pt lives with spouse Information entered by :: LPinson, LPN  Past Medical History:  Diagnosis Date  . Allergic rhinitis   . Hyperglycemia   . Hyperlipidemia   . Hypertension   . Melanoma ([place])   . Mixed incontinence   . Obesity   . Plantar fasciitis    with significant disability  . Tendonitis of foot    L, chronic foot pain   Past Surgical History:  Procedure Laterality Date  . ABDOMINAL HYSTERECTOMY     fibroids, ovaries intact  . BREAST EXCISIONAL BIOPSY Bilateral 1960's  -1970's   benign  . EYE SURGERY Bilateral 12/2015   at [facility]  . TENDON REPAIR  5/07   Tendon rupture L foot   Family History  Problem Relation Age of Onset  . Heart attack Mother   . Other Mother        ?thyroid problems  . Lung cancer Father    Social History   Socioeconomic History  . Marital status: Married    Spouse name: Not on file  . Number of children: Not on file  . Years of education: Not on file  . Highest education level: Not on file  Occupational History  . Not on file  Social Needs  . Financial resource strain: Not on file  . Food insecurity:    Worry: Not on file    Inability: Not on file  . Transportation needs:    Medical: Not on file    Non-medical: Not on file  Tobacco Use  . Smoking status: Never Smoker  . Smokeless tobacco: Never Used  Substance and Sexual Activity  . Alcohol use: No    Alcohol/week: 0.0 standard drinks  . Drug use: No  . Sexual activity: Not on file  Lifestyle  . Physical activity:    Days per week: Not on file    Minutes per session: Not on file  . Stress: Not on file  Relationships  . Social connections:    Talks  on phone: Not on file    Gets together: Not on file    Attends religious service: Not on file    Active member of club or organization: Not on file    Attends meetings of clubs or organizations: Not on file    Relationship status: Not on file  Other Topics Concern  . Not on file  Social History Narrative   Cannot exercise to chronic foot pain    Outpatient Encounter Medications as of 10/23/2018  Medication Sig  . albuterol (PROVENTIL HFA;VENTOLIN HFA) 108 (90 Base) MCG/ACT inhaler Inhale 2 puffs into the lungs every 4 (four) hours as needed for wheezing or shortness of breath (cough, shortness of breath or wheezing.).  Marland Kitchen allopurinol (ZYLOPRIM) 300 MG tablet Take 1 tablet (300 mg total) by mouth daily.  Marland Kitchen aspirin 81 MG tablet Take 81 mg by mouth daily.    Marland Kitchen CALCIUM-VITAMIN D PO Take 1 tablet by mouth daily.     . fluticasone (FLONASE) 50 MCG/ACT nasal spray PLACE 2 SPRAYS INTO BOTH NOSTRILS DAILY.  . fluticasone furoate-vilanterol (BREO ELLIPTA) 200-25 MCG/INH AEPB Inhale 1 puff into the lungs daily.  Marland Kitchen guaiFENesin (MUCINEX) 600 MG 12 hr tablet Take 1,200 mg by mouth daily.  . hydrochlorothiazide (HYDRODIURIL) 25 MG tablet Take 1 tablet (25 mg total) by mouth daily.  . meloxicam (MOBIC) 15 MG tablet Take 1 tablet (15 mg total) by mouth daily as needed for pain. With food  . Multiple Vitamin (MULTIVITAMIN) tablet Take 2 tablets by mouth daily.   . potassium chloride (KLOR-CON 10) 10 MEQ tablet Take 1 tablet (10 mEq total) by mouth daily.  . simvastatin (ZOCOR) 20 MG tablet Take 1 tablet (20 mg total) by mouth at bedtime.  Marland Kitchen telmisartan-hydrochlorothiazide (MICARDIS HCT) 80-12.5 MG tablet Take 1 tablet by mouth daily. **needs physical before future refills are given  . colchicine 0.6 MG tablet as needed. Take 2  tablets now with food and then 1 tablet twice a day with food   No facility-administered encounter medications on file as of 10/23/2018.     Activities of Daily Living In your present state of health, do you have any difficulty performing the following activities: 10/23/2018  Hearing? 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 N  Difficulty concentrating or making decisions? N  Walking or climbing stairs? N  Dressing or bathing? N  Doing errands, shopping? N  Preparing Food and eating ? N  Using the Toilet? N  In the past six months, have you accidently leaked urine? N  Do you have problems with loss of bowel control? N  Managing your Medications? N  Managing your Finances? N  Housekeeping or managing your Housekeeping? N  Some recent data might be hidden    Patient Care Team: Tower, Wynelle Fanny, MD as PCP - General Karle Starch, MD as Referring Physician (Ophthalmology) Andi Devon, MD as Referring Physician (General Surgery) Brendolyn Patty, MD as Consulting Physician  (Dermatology) Laverle Hobby, MD as Consulting Physician (Pulmonary Disease)    Assessment:   This is a routine wellness examination for Andrea Peters.  Exercise Activities and Dietary recommendations Current Exercise Habits: The patient does not participate in regular exercise at present, Exercise limited by: None identified  Goals    . Follow up with Primary Care Provider     Starting 10/23/2018, I will continue to take medications as prescribed and to keep appointments with PCP as scheduled.        Fall Risk  Fall Risk  10/23/2018 10/18/2017 10/17/2016 10/19/2015 10/15/2014  Falls in the past year? 0 No No No No  Number falls in past yr: - - - - -  Injury with Fall? - - - - -   Depression Screen PHQ 2/9 Scores 10/23/2018 10/18/2017 10/17/2016 10/19/2015  PHQ - 2 Score 0 0 0 0  PHQ- 9 Score 0 0 - -     Cognitive Function MMSE - Mini Mental State Exam 10/23/2018 10/18/2017 10/17/2016  Orientation to time 5 5 5   Orientation to Place 5 5 5   Registration 3 3 3   Attention/ Calculation 0 0 0  Recall 3 3 3   Language- name 2 objects 0 0 0  Language- repeat 1 1 1   Language- follow 3 step command 3 3 3   Language- read & follow direction 0 0 0  Write a sentence 0 0 0  Copy design 0 0 0  Total score 20 20 20      PLEASE NOTE: A Mini-Cog screen was completed. Maximum score is 20. A value of 0 denotes this part of [place] MMSE was not completed or the patient failed this part of the Mini-Cog screening.   Mini-Cog Screening Orientation to Time - Max 5 pts Orientation to Place - Max 5 pts Registration - Max 3 pts Recall - Max 3 pts Language Repeat - Max 1 pts Language Follow 3 Step Command - Max 3 pts     Immunization History  Administered Date(s) Administered  . Influenza,inj,Quad PF,6+ Mos 10/15/2014, 10/19/2015, 08/09/2016, 08/17/2017, 10/04/2018  . Pneumococcal Conjugate-13 10/15/2014  . Pneumococcal Polysaccharide-23 03/22/2010  . Td 01/13/1998, 01/30/2008  . Zoster 02/05/2008    Screening Tests Health Maintenance  Topic Date Due  . TETANUS/TDAP  09/04/2020 (Originally 01/29/2018)  . MAMMOGRAM  03/24/2019  . COLONOSCOPY  11/14/2023  . INFLUENZA VACCINE  Completed  . DEXA SCAN  Completed  . Hepatitis C Screening  Completed  . PNA vac Low Risk Adult  Completed      Plan:     I have personally reviewed, addressed, and noted the following in the patient's chart:  A. Medical and social history B. Use of alcohol, tobacco or illicit drugs  C. Current medications and supplements D. Functional ability and status E.  Nutritional status F.  Physical activity G. Advance directives H. List of other physicians I.  Hospitalizations, surgeries, and ER visits in previous 12 months J.  [place] to include hearing, vision, cognitive, depression L. Referrals and appointments - none  In addition, I have reviewed and discussed with patient certain preventive protocols, quality metrics, and best practice recommendations. A written personalized care plan for preventive services as well as general preventive health recommendations were provided to patient.  See attached scanned questionnaire for additional information.   Signed,   Lindell Noe, MHA, BS, LPN Health Coach

## 2018-10-23 NOTE — Patient Instructions (Signed)
Ms. Milhoan , Thank you for taking time to come for your Medicare Wellness Visit. I appreciate your ongoing commitment to your health goals. Please review the following plan we discussed and let me know if I can assist you in the future.   These are the goals we discussed: Goals    . Follow up with Primary Care Provider     Starting 10/23/2018, I will continue to take medications as prescribed and to keep appointments with PCP as scheduled.        This is a list of the screening recommended for you and due dates:  Health Maintenance  Topic Date Due  . Tetanus Vaccine  09/04/2020*  . Mammogram  03/24/2019  . Colon Cancer Screening  11/14/2023  . Flu Shot  Completed  . DEXA scan (bone density measurement)  Completed  .  Hepatitis C: One time screening is recommended by Center for Disease Control  (CDC) for  adults born from [birth year] through 1967.   Completed  . Pneumonia vaccines  Completed  *Topic was postponed. The date shown is not the original due date.   Preventive Care for Adults  A healthy lifestyle and preventive care can promote health and wellness. Preventive health guidelines for adults include the following key practices.  . A routine yearly physical is a good way to check with your health care provider about your health and preventive screening. It is a chance to share any concerns and updates on your health and to receive a thorough exam.  . Visit your dentist for a routine exam and preventive care every 6 months. Brush your teeth twice a day and floss once a day. Good oral hygiene prevents tooth decay and gum disease.  . The frequency of eye exams is based on your age, health, family medical history, use  of contact lenses, and other factors. Follow your health care provider's recommendations for frequency of eye exams.  . Eat a healthy diet. Foods like vegetables, fruits, whole grains, low-fat dairy products, and lean protein foods contain the nutrients you need without  too many calories. Decrease your intake of foods high in solid fats, added sugars, and salt. Eat the right amount of calories for you. Get information about a proper diet from your health care provider, if necessary.  . Regular physical exercise is one of the most important things you can do for your health. Most adults should get at least 150 minutes of moderate-intensity exercise (any activity that increases your heart rate and causes you to sweat) each week. In addition, most adults need muscle-strengthening exercises on 2 or more days a week.  Silver Sneakers may be a benefit available to you. To determine eligibility, you may visit the website: www.silversneakers.com or contact program at 281-769-2437 Mon-Fri between 8AM-8PM.   . Maintain a healthy weight. The body mass index (BMI) is a screening tool to identify possible weight problems. It provides an estimate of body fat based on height and weight. Your health care provider can find your BMI and can help you achieve or maintain a healthy weight.   For adults 20 years and older: ? A BMI below 18.5 is considered underweight. ? A BMI of 18.5 to 24.9 is normal. ? A BMI of 25 to 29.9 is considered overweight. ? A BMI of 30 and above is considered obese.   . Maintain normal blood lipids and cholesterol levels by exercising and minimizing your intake of saturated fat. Eat a balanced diet with plenty of  fruit and vegetables. Blood tests for lipids and cholesterol should begin at age 2 and be repeated every 5 years. If your lipid or cholesterol levels are high, you are over 50, or you are at high risk for heart disease, you may need your cholesterol levels checked more frequently. Ongoing high lipid and cholesterol levels should be treated with medicines if diet and exercise are not working.  . If you smoke, find out from your health care provider how to quit. If you do not use tobacco, please do not start.  . If you choose to drink alcohol,  please do not consume more than 2 drinks per day. One drink is considered to be 12 ounces (355 mL) of beer, 5 ounces (148 mL) of wine, or 1.5 ounces (44 mL) of liquor.  . If you are 60-9 years old, ask your health care provider if you should take aspirin to prevent strokes.  . Use sunscreen. Apply sunscreen liberally and repeatedly throughout the day. You should seek shade when your shadow is shorter than you. Protect yourself by wearing long sleeves, pants, a wide-brimmed hat, and sunglasses year round, whenever you are outdoors.  . Once a month, do a whole body skin exam, using a mirror to look at the skin on your back. Tell your health care provider of new moles, moles that have irregular borders, moles that are larger than a pencil eraser, or moles that have changed in shape or color.

## 2018-10-23 NOTE — Progress Notes (Signed)
PCP notes:   Health maintenance:  No gaps identified.  Abnormal screenings:   Hearing - failed  Hearing Screening   125Hz  250Hz  500Hz  1000Hz  2000Hz  3000Hz  4000Hz  6000Hz  8000Hz   Right ear:   0 0 0  0    Left ear:   40 40 40  40     Patient concerns:   None  Nurse concerns:  None  Next PCP appt:   10/26/18 @ 1430  I reviewed health advisor's note, was available for consultation, and agree with documentation and plan. Loura Pardon MD

## 2018-10-26 ENCOUNTER — Ambulatory Visit (INDEPENDENT_AMBULATORY_CARE_PROVIDER_SITE_OTHER): Payer: Medicare Other | Admitting: Family Medicine

## 2018-10-26 ENCOUNTER — Encounter: Payer: Self-pay | Admitting: Family Medicine

## 2018-10-26 VITALS — BP 152/74 | HR 82 | Temp 97.8°F | Ht 61.25 in | Wt 206.4 lb

## 2018-10-26 DIAGNOSIS — C4360 Malignant melanoma of unspecified upper limb, including shoulder: Secondary | ICD-10-CM

## 2018-10-26 DIAGNOSIS — I10 Essential (primary) hypertension: Secondary | ICD-10-CM | POA: Diagnosis not present

## 2018-10-26 DIAGNOSIS — R7303 Prediabetes: Secondary | ICD-10-CM | POA: Insufficient documentation

## 2018-10-26 DIAGNOSIS — M1000 Idiopathic gout, unspecified site: Secondary | ICD-10-CM | POA: Diagnosis not present

## 2018-10-26 DIAGNOSIS — M8589 Other specified disorders of bone density and structure, multiple sites: Secondary | ICD-10-CM

## 2018-10-26 MED ORDER — SIMVASTATIN 20 MG PO TABS: 20.0000 mg | ORAL_TABLET | Freq: Every day | ORAL | 3 refills | Status: DC

## 2018-10-26 MED ORDER — HYDROCHLOROTHIAZIDE 25 MG PO TABS: 25.0000 mg | ORAL_TABLET | Freq: Every day | ORAL | 3 refills | Status: DC

## 2018-10-26 MED ORDER — ALLOPURINOL 300 MG PO TABS: 300.0000 mg | ORAL_TABLET | Freq: Every day | ORAL | 3 refills | Status: DC

## 2018-10-26 MED ORDER — TELMISARTAN-HCTZ 80-12.5 MG PO TABS: 2.0000 | ORAL_TABLET | Freq: Every day | ORAL | 3 refills | Status: DC

## 2018-10-26 NOTE — Progress Notes (Signed)
Subjective:    Patient ID: Andrea Peters, female    DOB: November 16, 1945, 73 y.o.   MRN: [id number]  HPI  Here for annual f/u of chronic health problems   Has stye on R eye today  Bothersome   Doing fair overall  A lot going on  Husband had open heart surgery with complications- now improved Caring for two elderly people  That is a lot    Wt Readings from Last 3 Encounters:  10/26/18 206 lb 7 oz (93.6 kg)  10/23/18 204 lb 6.4 oz (92.7 kg)  10/04/18 204 lb 8 oz (92.8 kg)  stable  Not much time for any self care - should improve soon  38.69 kg/m   amw on 2/18 Failed hearing R ear- (for years) - she said that they could not do a hearing aide   Mammogram 7/19  Self breast exam -no lumps or changes   Colonoscopy 3/15 in [place] 10 y recall per pt   dexa 5/18 Osteopenia slightly improved at [facility] Taking her ca and D  No exercise (no time) , though she is active   Had zostavax 6/09  bp is elevated today No cp or palpitations or headaches or edema  No side effects to medicines  BP Readings from Last 3 Encounters:  10/26/18 (!) 152/74  10/23/18 (!) 144/90  10/04/18 (!) 152/92    Now takes miacardis HCT and also extra HCTZ Does not eat a lot of salty food  Husband is on super low salt diet   H/o melanoma of arm  Doing well  Follows up once a year / no longer has to have CT scans   Prediabetes Lab Results  Component Value Date   HGBA1C 6.2 10/23/2018  down from 6.4  Has to cook more healthy for her husband   Gout on allopurinol Lab Results  Component Value Date   LABURIC 5.4 10/23/2018   well controlled  Had one flare of gout when husband was in the hospital   Cholesterol  Lab Results  Component Value Date   CHOL 147 10/23/2018   CHOL 130 10/18/2017   CHOL 144 10/17/2016   Lab Results  Component Value Date   HDL 59.10 10/23/2018   HDL 51.50 10/18/2017   HDL 62.30 10/17/2016   Lab Results  Component Value Date   LDLCALC 74 10/23/2018   LDLCALC 65 10/18/2017   LDLCALC 70 10/17/2016   Lab Results  Component Value Date   TRIG 69.0 10/23/2018   TRIG 70.0 10/18/2017   TRIG 58.0 10/17/2016   Lab Results  Component Value Date   CHOLHDL 2 10/23/2018   CHOLHDL 3 10/18/2017   CHOLHDL 2 10/17/2016   Lab Results  Component Value Date   LDLDIRECT 141.9 01/23/2007   Lab Results  Component Value Date   ALT 16 10/23/2018   AST 13 10/23/2018   ALKPHOS 76 10/23/2018   BILITOT 0.8 10/23/2018   Lab Results  Component Value Date   CREATININE 1.00 10/23/2018   BUN 24 (H) 10/23/2018   NA 139 10/23/2018   K 4.2 10/23/2018   CL 98 10/23/2018   CO2 34 (H) 10/23/2018   Lab Results  Component Value Date   WBC 9.0 10/23/2018   HGB 13.0 10/23/2018   HCT 39.6 10/23/2018   MCV 94.3 10/23/2018   PLT 290.0 10/23/2018    Lab Results  Component Value Date   TSH 2.84 10/23/2018    Patient Active Problem List   Diagnosis  Date Noted  . Prediabetes 10/26/2018  . Hordeolum externum of right lower eyelid 10/04/2018  . Skin rash 10/04/2018  . Fatigue 03/29/2016  . Hypokalemia 10/19/2015  . Osteopenia 10/19/2015  . Estrogen deficiency 10/15/2014  . Encounter for Medicare annual wellness exam 10/15/2013  . Colon cancer screening 10/15/2013  . Melanoma of upper arm ([place]) 05/08/2013  . Gout 11/17/2010  . CHOLELITHIASIS, HX OF 11/17/2010  . HYPERCHOLESTEROLEMIA, PURE 05/17/2007  . ALLERGIC  RHINITIS 01/23/2007  . Essential hypertension 12/20/2006  . [place] 12/20/2006  . URINARY INCONTINENCE, STRESS 12/20/2006   Past Medical History:  Diagnosis Date  . Allergic rhinitis   . Hyperglycemia   . Hyperlipidemia   . Hypertension   . Melanoma ([place])   . Mixed incontinence   . Obesity   . Plantar fasciitis    with significant disability  . Tendonitis of foot    L, chronic foot pain   Past Surgical History:  Procedure Laterality Date  . ABDOMINAL HYSTERECTOMY     fibroids, ovaries intact  . BREAST EXCISIONAL  BIOPSY Bilateral 1960's -1970's   benign  . EYE SURGERY Bilateral 12/2015   at [facility]  . TENDON REPAIR  5/07   Tendon rupture L foot   Social History   Tobacco Use  . Smoking status: Never Smoker  . Smokeless tobacco: Never Used  Substance Use Topics  . Alcohol use: No    Alcohol/week: 0.0 standard drinks  . Drug use: No   Family History  Problem Relation Age of Onset  . Heart attack Mother   . Other Mother        ?thyroid problems  . Lung cancer Father    Allergies  Allergen Reactions  . Lipitor [Atorvastatin]     Muscle pain  . Naproxen Sodium   . Bioflavonoids Rash  . Naproxen Other (See Comments) and Rash    Severe indigestion   Current Outpatient Medications on File Prior to Visit  Medication Sig Dispense Refill  . albuterol (PROVENTIL HFA;VENTOLIN HFA) 108 (90 Base) MCG/ACT inhaler Inhale 2 puffs into the lungs every 4 (four) hours as needed for wheezing or shortness of breath (cough, shortness of breath or wheezing.). 1 Inhaler 1  . aspirin 81 MG tablet Take 81 mg by mouth daily.      Marland Kitchen CALCIUM-VITAMIN D PO Take 1 tablet by mouth daily.      . fluticasone (FLONASE) 50 MCG/ACT nasal spray PLACE 2 SPRAYS INTO BOTH NOSTRILS DAILY. 48 g 3  . fluticasone furoate-vilanterol (BREO ELLIPTA) 200-25 MCG/INH AEPB Inhale 1 puff into the lungs daily. 180 each 3  . guaiFENesin (MUCINEX) 600 MG 12 hr tablet Take 1,200 mg by mouth daily.    . meloxicam (MOBIC) 15 MG tablet Take 1 tablet (15 mg total) by mouth daily as needed for pain. With food 90 tablet 0  . Multiple Vitamin (MULTIVITAMIN) tablet Take 2 tablets by mouth daily.     . potassium chloride (KLOR-CON 10) 10 MEQ tablet Take 1 tablet (10 mEq total) by mouth daily. 90 tablet 3  . colchicine 0.6 MG tablet as needed. Take 2  tablets now with food and then 1 tablet twice a day with food     No current facility-administered medications on file prior to visit.      Review of Systems  Constitutional: Positive for fatigue.  Negative for activity change, appetite change, fever and unexpected weight change.  HENT: Negative for congestion, ear pain, rhinorrhea, sinus pressure and sore throat.  Eyes: Negative for pain, redness and visual disturbance.       Stye on R lower eyelid for a day  Respiratory: Negative for cough, shortness of breath and wheezing.   Cardiovascular: Negative for chest pain and palpitations.  Gastrointestinal: Negative for abdominal pain, blood in stool, constipation and diarrhea.  Endocrine: Negative for polydipsia and polyuria.  Genitourinary: Negative for dysuria, frequency and urgency.  Musculoskeletal: Negative for arthralgias, back pain and myalgias.  Skin: Negative for pallor and rash.  Allergic/Immunologic: Negative for environmental allergies.  Neurological: Negative for dizziness, syncope and headaches.  Hematological: Negative for adenopathy. Does not bruise/bleed easily.  Psychiatric/Behavioral: Negative for decreased concentration and dysphoric mood. The patient is not nervous/anxious.        Stressors          Objective:   Physical Exam Constitutional:      General: She is not in acute distress.    Appearance: Normal appearance. She is well-developed. She is obese. She is not ill-appearing.  HENT:     Head: Normocephalic and atraumatic.     Right Ear: Tympanic membrane, ear canal and external ear normal.     Left Ear: Tympanic membrane, ear canal and external ear normal.     Nose: Nose normal.     Mouth/Throat:     Mouth: Mucous membranes are moist.     Pharynx: Oropharynx is clear.  Eyes:     General: No scleral icterus.    Conjunctiva/sclera: Conjunctivae normal.     Pupils: Pupils are equal, round, and reactive to light.  Neck:     Musculoskeletal: Normal range of motion and neck supple.     Thyroid: No thyromegaly.     Vascular: No carotid bruit or JVD.     Comments: Small stye noted on inner L lower eyelid - that is open posteriorly with scant  drainage Cardiovascular:     Rate and Rhythm: Normal rate and regular rhythm.     Pulses: Normal pulses.     Heart sounds: Normal heart sounds. No gallop.   Pulmonary:     Effort: Pulmonary effort is normal. No respiratory distress.     Breath sounds: Normal breath sounds. No wheezing.  Chest:     Chest wall: No tenderness.  Abdominal:     General: Bowel sounds are normal. There is no distension or abdominal bruit.     Palpations: Abdomen is soft. There is no mass.     Tenderness: There is no abdominal tenderness.  Genitourinary:    Comments: Breast exam: No mass, nodules, thickening, tenderness, bulging, retraction, inflamation, nipple discharge or skin changes noted.  No axillary or clavicular LA.     Musculoskeletal: Normal range of motion.        General: No tenderness.     Comments: No kyphosis   Lymphadenopathy:     Cervical: No cervical adenopathy.  Skin:    General: Skin is warm and dry.     Capillary Refill: Capillary refill takes less than 2 seconds.     Coloration: Skin is not pale.     Findings: No erythema, lesion or rash.     Comments: Solar lentigines diffusely   Neurological:     General: No focal deficit present.     Mental Status: She is alert.     Cranial Nerves: No cranial nerve deficit.     Motor: No abnormal muscle tone.     Coordination: Coordination normal.     Deep Tendon Reflexes: Reflexes are  normal and symmetric. Reflexes normal.  Psychiatric:        Mood and Affect: Mood normal.           Assessment & Plan:   Problem List Items Addressed This Visit      Cardiovascular and Mediastinum   Essential hypertension - Primary    bp in fair control at this time  BP Readings from Last 1 Encounters:  10/26/18 (!) 152/74   Plan to increase miacardis-hct to 160-25 mg daily  inst to alert if side effects or problems  F/u planned  Most recent labs reviewed  Disc lifstyle change with low sodium diet and exercise        Relevant Medications    telmisartan-hydrochlorothiazide (MICARDIS HCT) 80-12.5 MG tablet   simvastatin (ZOCOR) 20 MG tablet   hydrochlorothiazide (HYDRODIURIL) 25 MG tablet     Musculoskeletal and Integument   Osteopenia    Due for 2 y dexa after 5/20 Continue ca and D No falls or fx Enc to exercise when she can find time         Other   Gout    Lab Results  Component Value Date   LABURIC 5.4 10/23/2018   Continue current allopurinol dose Only one flare this year       Melanoma of upper arm ([place])    Doing well with dermatology f/u appts No re occurance      Relevant Medications   allopurinol (ZYLOPRIM) 300 MG tablet   Prediabetes    Lab Results  Component Value Date   HGBA1C 6.2 10/23/2018   disc imp of low glycemic diet and wt loss to prevent DM2

## 2018-10-26 NOTE — Patient Instructions (Addendum)
You can get a bone density test after 5/20  Call when you are ready to schedule it   Your mammogram will be due in July   To prevent diabetes :  Try to get most of your carbohydrates from produce (with the exception of white potatoes)  Eat less bread/pasta/rice/snack foods/cereals/sweets and other items from the middle of the grocery store (processed carbs)   Try to find time for self care whenever you can

## 2018-10-27 NOTE — Assessment & Plan Note (Signed)
Due for 2 y dexa after 5/20 Continue ca and D No falls or fx Enc to exercise when she can find time

## 2018-10-27 NOTE — Assessment & Plan Note (Signed)
Doing well with dermatology f/u appts No re occurance

## 2018-10-27 NOTE — Assessment & Plan Note (Signed)
bp in fair control at this time  BP Readings from Last 1 Encounters:  10/26/18 (!) 152/74   Plan to increase miacardis-hct to 160-25 mg daily  inst to alert if side effects or problems  F/u planned  Most recent labs reviewed  Disc lifstyle change with low sodium diet and exercise

## 2018-10-27 NOTE — Assessment & Plan Note (Signed)
Lab Results  Component Value Date   LABURIC 5.4 10/23/2018   Continue current allopurinol dose Only one flare this year

## 2018-10-27 NOTE — Assessment & Plan Note (Signed)
Lab Results  Component Value Date   HGBA1C 6.2 10/23/2018   disc imp of low glycemic diet and wt loss to prevent DM2

## 2018-11-21 ENCOUNTER — Other Ambulatory Visit: Payer: Self-pay | Admitting: Family Medicine

## 2018-11-21 NOTE — Telephone Encounter (Signed)
Electronic refill request Last office visit 10/26/18 Last refill 08/27/18 #90 See allergy/contraindication

## 2018-12-09 ENCOUNTER — Other Ambulatory Visit: Payer: Self-pay | Admitting: Family Medicine

## 2019-02-13 ENCOUNTER — Other Ambulatory Visit: Payer: Self-pay | Admitting: Family Medicine

## 2019-02-19 ENCOUNTER — Other Ambulatory Visit: Payer: Self-pay | Admitting: Family Medicine

## 2019-02-19 NOTE — Telephone Encounter (Signed)
CPE scheduled for 11/05/19, last filled on 11/21/18 #90 tabs with 0 refills

## 2019-04-26 ENCOUNTER — Other Ambulatory Visit: Payer: Self-pay | Admitting: Family Medicine

## 2019-04-29 NOTE — Telephone Encounter (Signed)
Pt left v/m that [facility] has not gotten response from refill request for Breo. Pt request cb.

## 2019-04-29 NOTE — Telephone Encounter (Signed)
Just received refill request. Med filled today

## 2019-05-09 ENCOUNTER — Other Ambulatory Visit: Payer: Self-pay | Admitting: Family Medicine

## 2019-07-26 ENCOUNTER — Telehealth: Payer: Self-pay | Admitting: Family Medicine

## 2019-07-26 MED ORDER — CETIRIZINE HCL 10 MG PO TABS: 10.0000 mg | ORAL_TABLET | Freq: Every day | ORAL | 3 refills | Status: DC

## 2019-07-26 NOTE — Telephone Encounter (Signed)
I sent the zyrtec  It is available otc so unsure if her ins will cover it   Store brand is cheaper if she does have to pay for it

## 2019-07-26 NOTE — Telephone Encounter (Signed)
Pt notified Rx sent and advise pt of Dr. Marliss Coots comments

## 2019-07-26 NOTE — Telephone Encounter (Signed)
Pt said she has been using the flonase and the breo and also her inhaler as needed. Pt said that medication regiment has been working fine but pt went outside yesterday and was hanging lights around her home and she thinks being outside in all of the levels and trees and around her home that is triggered her allergies to worsen. Pt is requesting a Rx sent to pharmacy she isn't sure what medication Dr. Glori Bickers prescribed in the past but she said she thinks it was the generic zyrtec but she is requesting whatever medication Dr. Glori Bickers recommends to be send in as a prescription   [facility] [address]

## 2019-07-26 NOTE — Telephone Encounter (Signed)
For nasal allergies she has flonase on her list  She also has breo ellipta and albuterol inhalers (for bronchial symtoms)   Otc: zyrtec, allegra, claritin and xyzal antihistamines are all available   Do these sound familiar Does she know which one she wants?

## 2019-07-26 NOTE — Telephone Encounter (Signed)
Patient called stating that her yearly allergies have started,. She stated that she get these every year and you have prescribed her a medication before that will clear them up, and she would like to know if you could prescribed this for her again. Advised of virtual visit may be needed but she wanted a message sent first,   Patient did not remember the name of the medication she was previously prescribed.

## 2019-07-27 ENCOUNTER — Other Ambulatory Visit: Payer: Self-pay | Admitting: Family Medicine

## 2019-08-20 ENCOUNTER — Other Ambulatory Visit: Payer: Self-pay | Admitting: Family Medicine

## 2019-08-24 ENCOUNTER — Other Ambulatory Visit: Payer: Self-pay | Admitting: Family Medicine

## 2019-09-23 ENCOUNTER — Telehealth: Payer: Self-pay | Admitting: Family Medicine

## 2019-09-23 NOTE — Telephone Encounter (Signed)
Pt notified of Dr. Marliss Coots instructions. Pt is a little hesitant to come in due to Covid so she said since her sxs are improving she will give it another week and if it's not improving she will schedule a f/u with Dr. Lorelei Pont

## 2019-09-23 NOTE — Telephone Encounter (Signed)
That can certainly happen if she has issues with her low back, but I cannot say for sure.  Follow up for an appt if no improvement  (seeing dr Lorelei Pont is fine as well)

## 2019-09-23 NOTE — Telephone Encounter (Signed)
Patient called today requesting advice. She stated that about 2 weeks ago she pinched a nerve, it seems to be improving. But she states that every now and then her leg would give out. Patient wanted to know if this is normal or something she should be concerned about   Please advise

## 2019-09-26 ENCOUNTER — Other Ambulatory Visit: Payer: Self-pay | Admitting: Family Medicine

## 2019-10-17 DIAGNOSIS — M25562 Pain in left knee: Secondary | ICD-10-CM | POA: Diagnosis not present

## 2019-10-17 DIAGNOSIS — M25552 Pain in left hip: Secondary | ICD-10-CM | POA: Diagnosis not present

## 2019-10-17 DIAGNOSIS — M5416 Radiculopathy, lumbar region: Secondary | ICD-10-CM | POA: Diagnosis not present

## 2019-10-17 DIAGNOSIS — M6281 Muscle weakness (generalized): Secondary | ICD-10-CM | POA: Diagnosis not present

## 2019-10-17 DIAGNOSIS — M7050 Other bursitis of knee, unspecified knee: Secondary | ICD-10-CM | POA: Diagnosis not present

## 2019-10-17 DIAGNOSIS — M7062 Trochanteric bursitis, left hip: Secondary | ICD-10-CM | POA: Diagnosis not present

## 2019-10-18 ENCOUNTER — Other Ambulatory Visit: Payer: Self-pay | Admitting: Orthopedic Surgery

## 2019-10-18 DIAGNOSIS — M25552 Pain in left hip: Secondary | ICD-10-CM

## 2019-10-18 DIAGNOSIS — M6281 Muscle weakness (generalized): Secondary | ICD-10-CM

## 2019-10-28 ENCOUNTER — Telehealth: Payer: Self-pay | Admitting: Family Medicine

## 2019-10-28 DIAGNOSIS — M1000 Idiopathic gout, unspecified site: Secondary | ICD-10-CM

## 2019-10-28 DIAGNOSIS — I10 Essential (primary) hypertension: Secondary | ICD-10-CM

## 2019-10-28 DIAGNOSIS — R7303 Prediabetes: Secondary | ICD-10-CM

## 2019-10-28 DIAGNOSIS — E7800 Pure hypercholesterolemia, unspecified: Secondary | ICD-10-CM

## 2019-10-28 NOTE — Telephone Encounter (Signed)
-----   Message from Cloyd Stagers, RT sent at 10/17/2019  1:41 PM EST ----- Regarding: Lab Orders for Tuesday 2.23.2021 Please place lab orders for Tuesday 2.23.2021, office visit for physical on Tuesday 3.2.2021 Thank you, Dyke Maes RT(R)

## 2019-10-29 ENCOUNTER — Other Ambulatory Visit: Payer: Self-pay

## 2019-10-29 ENCOUNTER — Other Ambulatory Visit (INDEPENDENT_AMBULATORY_CARE_PROVIDER_SITE_OTHER): Payer: Medicare Other

## 2019-10-29 DIAGNOSIS — I10 Essential (primary) hypertension: Secondary | ICD-10-CM

## 2019-10-29 DIAGNOSIS — R7303 Prediabetes: Secondary | ICD-10-CM | POA: Diagnosis not present

## 2019-10-29 DIAGNOSIS — E7800 Pure hypercholesterolemia, unspecified: Secondary | ICD-10-CM

## 2019-10-29 DIAGNOSIS — M1000 Idiopathic gout, unspecified site: Secondary | ICD-10-CM

## 2019-10-29 LAB — COMPREHENSIVE METABOLIC PANEL
ALT: 16 U/L (ref 0–35)
AST: 14 U/L (ref 0–37)
Albumin: 4.2 g/dL (ref 3.5–5.2)
Alkaline Phosphatase: 78 U/L (ref 39–117)
BUN: 23 mg/dL (ref 6–23)
CO2: 34 mEq/L — ABNORMAL HIGH (ref 19–32)
Calcium: 10.8 mg/dL — ABNORMAL HIGH (ref 8.4–10.5)
Chloride: 101 mEq/L (ref 96–112)
Creatinine, Ser: 1.07 mg/dL (ref 0.40–1.20)
GFR: 49.96 mL/min — ABNORMAL LOW (ref >60.00)
Glucose, Bld: 121 mg/dL — ABNORMAL HIGH (ref 70–99)
Potassium: 4.1 mEq/L (ref 3.5–5.1)
Sodium: 141 mEq/L (ref 135–145)
Total Bilirubin: 0.8 mg/dL (ref 0.2–1.2)
Total Protein: 6.7 g/dL (ref 6.0–8.3)

## 2019-10-29 LAB — LIPID PANEL
Cholesterol: 143 mg/dL (ref 0–200)
HDL: 45.7 mg/dL (ref >39.00)
LDL Cholesterol: 75 mg/dL (ref 0–99)
NonHDL: 97.28
Total CHOL/HDL Ratio: 3
Triglycerides: 112 mg/dL (ref 0.0–149.0)
VLDL: 22.4 mg/dL (ref 0.0–40.0)

## 2019-10-29 LAB — CBC WITH DIFFERENTIAL/PLATELET
Basophils Absolute: 0.1 10*3/uL (ref 0.0–0.1)
Basophils Relative: 0.6 % (ref 0.0–3.0)
Eosinophils Absolute: 0.3 10*3/uL (ref 0.0–0.7)
Eosinophils Relative: 3.4 % (ref 0.0–5.0)
HCT: 40 % (ref 36.0–46.0)
Hemoglobin: 13 g/dL (ref 12.0–15.0)
Lymphocytes Relative: 21.5 % (ref 12.0–46.0)
Lymphs Abs: 1.9 10*3/uL (ref 0.7–4.0)
MCHC: 32.5 g/dL (ref 30.0–36.0)
MCV: 97.1 fl (ref 78.0–100.0)
Monocytes Absolute: 0.7 10*3/uL (ref 0.1–1.0)
Monocytes Relative: 8.3 % (ref 3.0–12.0)
Neutro Abs: 5.9 10*3/uL (ref 1.4–7.7)
Neutrophils Relative %: 66.2 % (ref 43.0–77.0)
Platelets: 269 10*3/uL (ref 150.0–400.0)
RBC: 4.12 Mil/uL (ref 3.87–5.11)
RDW: 14.3 % (ref 11.5–15.5)
WBC: 8.9 10*3/uL (ref 4.0–10.5)

## 2019-10-29 LAB — URIC ACID: Uric Acid, Serum: 6.1 mg/dL (ref 2.4–7.0)

## 2019-10-29 LAB — TSH: TSH: 3.83 u[IU]/mL (ref 0.35–4.50)

## 2019-10-29 LAB — HEMOGLOBIN A1C: Hgb A1c MFr Bld: 6.4 % (ref 4.6–6.5)

## 2019-10-31 ENCOUNTER — Other Ambulatory Visit: Payer: Self-pay

## 2019-10-31 ENCOUNTER — Ambulatory Visit
Admission: RE | Admit: 2019-10-31 | Discharge: 2019-10-31 | Disposition: A | Discharge status: Home / Self Care | Payer: Medicare Other | Source: Ambulatory Visit | Attending: Orthopedic Surgery | Admitting: Orthopedic Surgery

## 2019-10-31 DIAGNOSIS — M25552 Pain in left hip: Secondary | ICD-10-CM | POA: Diagnosis not present

## 2019-10-31 DIAGNOSIS — M6281 Muscle weakness (generalized): Secondary | ICD-10-CM | POA: Diagnosis not present

## 2019-10-31 DIAGNOSIS — M5126 Other intervertebral disc displacement, lumbar region: Secondary | ICD-10-CM | POA: Diagnosis not present

## 2019-11-03 ENCOUNTER — Other Ambulatory Visit: Payer: Self-pay | Admitting: Family Medicine

## 2019-11-03 ENCOUNTER — Ambulatory Visit: Payer: Medicare Other

## 2019-11-03 ENCOUNTER — Ambulatory Visit: Payer: Medicare Other | Attending: Internal Medicine

## 2019-11-03 DIAGNOSIS — Z23 Encounter for immunization: Secondary | ICD-10-CM | POA: Insufficient documentation

## 2019-11-03 NOTE — Progress Notes (Signed)
   Covid-19 Vaccination Clinic  Name:  Andrea Peters    MRN: [id number] DOB: 09/30/1945  11/03/2019  Andrea Peters was observed post Covid-19 immunization for 15 minutes without incidence. She was provided with Vaccine Information Sheet and instruction to access the V-Safe system.   Andrea Peters was instructed to call 911 with any severe reactions post vaccine: Marland Kitchen Difficulty breathing  . Swelling of your face and throat  . A fast heartbeat  . A bad rash all over your body  . Dizziness and weakness    Immunizations Administered    Name Date Dose VIS Date Route   Pfizer COVID-19 Vaccine 11/03/2019  2:11 PM 0.3 mL 08/16/2019 Intramuscular   Manufacturer: [place]   Lot: [id number]   [place]: [address]-1

## 2019-11-04 DIAGNOSIS — M719 Bursopathy, unspecified: Secondary | ICD-10-CM | POA: Diagnosis not present

## 2019-11-04 DIAGNOSIS — M6281 Muscle weakness (generalized): Secondary | ICD-10-CM | POA: Diagnosis not present

## 2019-11-04 DIAGNOSIS — M5416 Radiculopathy, lumbar region: Secondary | ICD-10-CM | POA: Diagnosis not present

## 2019-11-04 NOTE — Telephone Encounter (Signed)
CPE scheduled tomorrow. Last filled on 02/19/19 #90 tabs with 2 refills

## 2019-11-05 ENCOUNTER — Ambulatory Visit (INDEPENDENT_AMBULATORY_CARE_PROVIDER_SITE_OTHER): Payer: Medicare Other | Admitting: Family Medicine

## 2019-11-05 ENCOUNTER — Other Ambulatory Visit: Payer: Self-pay

## 2019-11-05 ENCOUNTER — Encounter: Payer: Self-pay | Admitting: Family Medicine

## 2019-11-05 VITALS — BP 134/68 | HR 67 | Temp 97.2°F | Ht 61.0 in | Wt 202.3 lb

## 2019-11-05 DIAGNOSIS — C4360 Malignant melanoma of unspecified upper limb, including shoulder: Secondary | ICD-10-CM | POA: Diagnosis not present

## 2019-11-05 DIAGNOSIS — E7800 Pure hypercholesterolemia, unspecified: Secondary | ICD-10-CM | POA: Diagnosis not present

## 2019-11-05 DIAGNOSIS — I10 Essential (primary) hypertension: Secondary | ICD-10-CM | POA: Diagnosis not present

## 2019-11-05 DIAGNOSIS — R7303 Prediabetes: Secondary | ICD-10-CM | POA: Diagnosis not present

## 2019-11-05 DIAGNOSIS — M1000 Idiopathic gout, unspecified site: Secondary | ICD-10-CM

## 2019-11-05 DIAGNOSIS — Z0000 Encounter for general adult medical examination without abnormal findings: Secondary | ICD-10-CM

## 2019-11-05 DIAGNOSIS — E2839 Other primary ovarian failure: Secondary | ICD-10-CM | POA: Diagnosis not present

## 2019-11-05 DIAGNOSIS — M8589 Other specified disorders of bone density and structure, multiple sites: Secondary | ICD-10-CM | POA: Diagnosis not present

## 2019-11-05 LAB — VITAMIN D 25 HYDROXY (VIT D DEFICIENCY, FRACTURES): VITD: 55.47 ng/mL (ref 30.00–100.00)

## 2019-11-05 LAB — PHOSPHORUS: Phosphorus: 3 mg/dL (ref 2.3–4.6)

## 2019-11-05 MED ORDER — HYDROCHLOROTHIAZIDE 25 MG PO TABS: 25.0000 mg | ORAL_TABLET | Freq: Every day | ORAL | 3 refills | Status: DC

## 2019-11-05 MED ORDER — POTASSIUM CHLORIDE ER 10 MEQ PO TBCR: 10.0000 meq | EXTENDED_RELEASE_TABLET | Freq: Every day | ORAL | 3 refills | Status: DC

## 2019-11-05 MED ORDER — SIMVASTATIN 20 MG PO TABS: ORAL_TABLET | ORAL | 3 refills | Status: DC

## 2019-11-05 MED ORDER — TELMISARTAN-HCTZ 80-12.5 MG PO TABS: 1.0000 | ORAL_TABLET | Freq: Every day | ORAL | 3 refills | Status: DC

## 2019-11-05 NOTE — Assessment & Plan Note (Signed)
Rev dexa 5/18  No fractures but has fallen due to back/leg problem (in PT now) No fractures Taking D and ca  Exercise is PT now  Ref for dexa fu  Ca is mildly elevated- will check PTH and phos today

## 2019-11-05 NOTE — Assessment & Plan Note (Signed)
Disc goals for lipids and reasons to control them Rev last labs with pt Rev low sat fat diet in detail Stable with simvastatin and diet

## 2019-11-05 NOTE — Assessment & Plan Note (Signed)
Lab Results  Component Value Date   HGBA1C 6.4 10/29/2019   This is up slightly  disc imp of low glycemic diet and wt loss to prevent DM2

## 2019-11-05 NOTE — Assessment & Plan Note (Signed)
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

## 2019-11-05 NOTE — Assessment & Plan Note (Signed)
With known osteopenia PTH, ca/ phos ordered today

## 2019-11-05 NOTE — Progress Notes (Signed)
Subjective:    Patient ID: Andrea Peters, female    DOB: [birth year], 74 y.o.   MRN: [id number]  This visit occurred during the SARS-CoV-2 public health emergency.  Safety protocols were in place, including screening questions prior to the visit, additional usage of staff PPE, and extensive cleaning of exam room while observing appropriate contact time as indicated for disinfecting solutions.    HPI Pt presents for amw and review of chronic health problems   I have personally reviewed the Medicare Annual Wellness questionnaire and have noted 1. The patient's medical and social history 2. Their use of alcohol, tobacco or illicit drugs 3. Their current medications and supplements 4. The patient's functional ability including ADL's, fall risks, home safety risks and hearing or visual             impairment. 5. Diet and physical activities 6. Evidence for depression or mood disorders  The patients weight, height, BMI have been recorded in the chart and visual acuity is per eye clinic.  I have made referrals, counseling and provided education to the patient based review of the above and I have provided the pt with a written personalized care plan for preventive services. Reviewed and updated provider list, see scanned forms.  See scanned forms.  Routine anticipatory guidance given to patient.  See health maintenance. Colon cancer screening -colonoscopy 3/15- 10 y recall  Breast cancer screening mammogram 7/19 - she had to cancel her appt  Self breast exam- no lumps  Flu vaccine - missed her shot this year  First covid vaccine 2/28- getting 2nd one this mo  Tetanus vaccine -postponed/financial Pneumovax completed  Zoster vaccine   zostavax 6/09  Dexa 5/18 -osteopenia  Falls- several from a weak hamstring from disc disease  Fractures-none  Supplements- taking D and Ca Exercise -in PT for her back and leg right now , no regular exercise (no time for self care)  Ca level is mildly  high at 10.8  GFR 49.9  Advance directive- has living will and poa  Cognitive function addressed- see scanned forms- and if abnormal then additional documentation follows. (also brain tumor)  Memory is fairly good / no confusion    PMH and SH reviewed  Meds, vitals, and allergies reviewed.   ROS: See HPI.  Otherwise negative.    H/o melanoma on arm  Has not followed up for a year  She stays out of the sun    Very tough year  Caring for husband/ also he has dementia (mild so far)    Dealing with back pain -worse when she fell up and down steps  Saw ortho- MRI showed pinched nerve and she is going to PT  This caused L hamstring to loose strength and that caused the falls  All coming from a disc problem (not surgical at this point)  No pain right now  Unsure what caused the L3 disc injury     Weight : Wt Readings from Last 3 Encounters:  11/05/19 202 lb 5 oz (91.8 kg)  10/26/18 206 lb 7 oz (93.6 kg)  10/23/18 204 lb 6.4 oz (92.7 kg)  wt is down 4 lb  In PT for exercise  She eats fairly healthy - she avoids sugar/snacks but needs more veggies  38.23 kg/m   Hearing/vision:  Hearing Screening   125Hz  250Hz  500Hz  1000Hz  2000Hz  3000Hz  4000Hz  6000Hz  8000Hz   Right ear:   0 0 0  0    Left ear:   40  40 40  40    Vision Screening Comments: Pt had eye exam with Dr. Kerin Ransom yearly. Pt has next exam on 11/19/19  Has decreased hearing in R ear - she did get evaluated and hearing aides would not help   Depression screen  Score of 1  Has a good outlook even during stress   bp is stable today  No cp or palpitations or headaches or edema  No side effects to medicines  BP Readings from Last 3 Encounters:  11/05/19 134/68  10/26/18 (!) 152/74  10/23/18 (!) 144/90     Last visit miacardis dose inc to 160-25   Lab Results  Component Value Date   CREATININE 1.07 10/29/2019   BUN 23 10/29/2019   NA 141 10/29/2019   K 4.1 10/29/2019   CL 101 10/29/2019   CO2 34 (H)  10/29/2019   Gout- taking allopurinol Lab Results  Component Value Date   LABURIC 6.1 10/29/2019   Lab Results  Component Value Date   ALT 16 10/29/2019   AST 14 10/29/2019   ALKPHOS 78 10/29/2019   BILITOT 0.8 10/29/2019     Hyperlipidemia Lab Results  Component Value Date   CHOL 143 10/29/2019   CHOL 147 10/23/2018   CHOL 130 10/18/2017   Lab Results  Component Value Date   HDL 45.70 10/29/2019   HDL 59.10 10/23/2018   HDL 51.50 10/18/2017   Lab Results  Component Value Date   LDLCALC 75 10/29/2019   LDLCALC 74 10/23/2018   LDLCALC 65 10/18/2017   Lab Results  Component Value Date   TRIG 112.0 10/29/2019   TRIG 69.0 10/23/2018   TRIG 70.0 10/18/2017   Lab Results  Component Value Date   CHOLHDL 3 10/29/2019   CHOLHDL 2 10/23/2018   CHOLHDL 3 10/18/2017   Lab Results  Component Value Date   LDLDIRECT 141.9 01/23/2007  simvastatin and diet   Prediabetes Lab Results  Component Value Date   HGBA1C 6.4 10/29/2019  up from 6.2  Patient Active Problem List   Diagnosis Date Noted  . Serum calcium elevated 11/05/2019  . Prediabetes 10/26/2018  . Hordeolum externum of right lower eyelid 10/04/2018  . Skin rash 10/04/2018  . Fatigue 03/29/2016  . Hypokalemia 10/19/2015  . Osteopenia 10/19/2015  . Estrogen deficiency 10/15/2014  . Encounter for Medicare annual wellness exam 10/15/2013  . Colon cancer screening 10/15/2013  . Melanoma of upper arm ([place]) 05/08/2013  . Gout 11/17/2010  . CHOLELITHIASIS, HX OF 11/17/2010  . HYPERCHOLESTEROLEMIA, PURE 05/17/2007  . ALLERGIC  RHINITIS 01/23/2007  . Essential hypertension 12/20/2006  . [place], [place] 12/20/2006  . URINARY INCONTINENCE, STRESS 12/20/2006   Past Medical History:  Diagnosis Date  . Allergic rhinitis   . Hyperglycemia   . Hyperlipidemia   . Hypertension   . Melanoma ([place])   . Mixed incontinence   . Obesity   . Plantar fasciitis    with significant disability  . Tendonitis of foot     L, chronic foot pain   Past Surgical History:  Procedure Laterality Date  . ABDOMINAL HYSTERECTOMY     fibroids, ovaries intact  . BREAST EXCISIONAL BIOPSY Bilateral 1960's -1970's   benign  . EYE SURGERY Bilateral 12/2015   at [facility]  . TENDON REPAIR  5/07   Tendon rupture L foot   Social History   Tobacco Use  . Smoking status: Never Smoker  . Smokeless tobacco: Never Used  Substance Use Topics  . Alcohol use: No  Alcohol/week: 0.0 standard drinks  . Drug use: No   Family History  Problem Relation Age of Onset  . Heart attack Mother   . Other Mother        ?thyroid problems  . Lung cancer Father    Allergies  Allergen Reactions  . Lipitor [Atorvastatin]     Muscle pain  . Naproxen Sodium   . Bioflavonoids Rash  . Naproxen Other (See Comments) and Rash    Severe indigestion   Current Outpatient Medications on File Prior to Visit  Medication Sig Dispense Refill  . albuterol (PROVENTIL HFA;VENTOLIN HFA) 108 (90 Base) MCG/ACT inhaler Inhale 2 puffs into the lungs every 4 (four) hours as needed for wheezing or shortness of breath (cough, shortness of breath or wheezing.). 1 Inhaler 1  . allopurinol (ZYLOPRIM) 300 MG tablet Take 1 tablet (300 mg total) by mouth daily. 90 tablet 3  . aspirin 81 MG tablet Take 81 mg by mouth daily.      Marland Kitchen BREO ELLIPTA 200-25 MCG/INH AEPB USE 1 INHALATION ORALLY    DAILY 180 each 1  . CALCIUM-VITAMIN D PO Take 1 tablet by mouth daily.      . cetirizine (ZYRTEC) 10 MG tablet Take 1 tablet (10 mg total) by mouth daily. 30 tablet 3  . fluticasone (FLONASE) 50 MCG/ACT nasal spray SPRAY 2 SPRAYS INTO EACH NOSTRIL EVERY DAY 48 mL 1  . guaiFENesin (MUCINEX) 600 MG 12 hr tablet Take 1,200 mg by mouth daily.    . meloxicam (MOBIC) 15 MG tablet TAKE 1 TABLET (15 MG TOTAL) BY MOUTH DAILY AS NEEDED FOR PAIN WITH FOOD 90 tablet 0  . Multiple Vitamin (MULTIVITAMIN) tablet Take 2 tablets by mouth daily.     . colchicine 0.6 MG tablet as needed. Take 2   tablets now with food and then 1 tablet twice a day with food     No current facility-administered medications on file prior to visit.     Review of Systems  Constitutional: Negative for activity change, appetite change, fatigue, fever and unexpected weight change.  HENT: Negative for congestion, ear pain, rhinorrhea, sinus pressure and sore throat.   Eyes: Negative for pain, redness and visual disturbance.  Respiratory: Negative for cough, shortness of breath and wheezing.   Cardiovascular: Negative for chest pain and palpitations.  Gastrointestinal: Negative for abdominal pain, blood in stool, constipation and diarrhea.  Endocrine: Negative for polydipsia and polyuria.  Genitourinary: Negative for dysuria, frequency and urgency.  Musculoskeletal: Positive for arthralgias, back pain and gait problem. Negative for myalgias.  Skin: Negative for pallor and rash.  Allergic/Immunologic: Negative for environmental allergies.  Neurological: Positive for weakness. Negative for dizziness, syncope and headaches.  Hematological: Negative for adenopathy. Does not bruise/bleed easily.  Psychiatric/Behavioral: Negative for decreased concentration and dysphoric mood. The patient is not nervous/anxious.        Stressors  Handling them well        Objective:   Physical Exam Constitutional:      General: She is not in acute distress.    Appearance: Normal appearance. She is well-developed. She is obese. She is not ill-appearing or diaphoretic.  HENT:     Head: Normocephalic and atraumatic.     Right Ear: Tympanic membrane, ear canal and external ear normal.     Left Ear: Tympanic membrane, ear canal and external ear normal.     Nose: Nose normal. No congestion.     Mouth/Throat:     Mouth: Mucous membranes  are moist.     Pharynx: Oropharynx is clear. No posterior oropharyngeal erythema.  Eyes:     General: No scleral icterus.    Extraocular Movements: Extraocular movements intact.      Conjunctiva/sclera: Conjunctivae normal.     Pupils: Pupils are equal, round, and reactive to light.  Neck:     Thyroid: No thyromegaly.     Vascular: No carotid bruit or JVD.  Cardiovascular:     Rate and Rhythm: Normal rate and regular rhythm.     Pulses: Normal pulses.     Heart sounds: Normal heart sounds. No gallop.   Pulmonary:     Effort: Pulmonary effort is normal. No respiratory distress.     Breath sounds: Normal breath sounds. No wheezing.     Comments: Good air exch Chest:     Chest wall: No tenderness.  Abdominal:     General: Bowel sounds are normal. There is no distension or abdominal bruit.     Palpations: Abdomen is soft. There is no mass.     Tenderness: There is no abdominal tenderness.     Hernia: No hernia is present.  Genitourinary:    Comments: Breast exam: No mass, nodules, thickening, tenderness, bulging, retraction, inflamation, nipple discharge or skin changes noted.  No axillary or clavicular LA.     Musculoskeletal:        General: No tenderness. Normal range of motion.     Cervical back: Normal range of motion and neck supple. No rigidity. No muscular tenderness.     Right lower leg: No edema.     Left lower leg: No edema.  Lymphadenopathy:     Cervical: No cervical adenopathy.  Skin:    General: Skin is warm and dry.     Coloration: Skin is not pale.     Findings: No erythema or rash.     Comments: Solar lentigines diffusely Also sks   Neurological:     Mental Status: She is alert. Mental status is at baseline.     Cranial Nerves: No cranial nerve deficit.     Motor: No abnormal muscle tone.     Coordination: Coordination normal.     Gait: Gait normal.     Deep Tendon Reflexes: Reflexes are normal and symmetric. Reflexes normal.  Psychiatric:        Mood and Affect: Mood normal.        Cognition and Memory: Cognition and memory normal.           Assessment & Plan:   Problem List Items Addressed This Visit      Cardiovascular and  Mediastinum   Essential hypertension    bp in fair control at this time  BP Readings from Last 1 Encounters:  11/05/19 134/68   No changes needed Most recent labs reviewed  Disc lifstyle change with low sodium diet and exercise        Relevant Medications   hydrochlorothiazide (HYDRODIURIL) 25 MG tablet   simvastatin (ZOCOR) 20 MG tablet   telmisartan-hydrochlorothiazide (MICARDIS HCT) 80-12.5 MG tablet     Musculoskeletal and Integument   Osteopenia    Rev dexa 5/18  No fractures but has fallen due to back/leg problem (in PT now) No fractures Taking D and ca  Exercise is PT now  Ref for dexa fu  Ca is mildly elevated- will check PTH and phos today        Other   HYPERCHOLESTEROLEMIA, PURE    Disc goals for lipids and  reasons to control them Rev last labs with pt Rev low sat fat diet in detail Stable with simvastatin and diet        Relevant Medications   hydrochlorothiazide (HYDRODIURIL) 25 MG tablet   simvastatin (ZOCOR) 20 MG tablet   telmisartan-hydrochlorothiazide (MICARDIS HCT) 80-12.5 MG tablet   Gout    On allopurinol Lab Results  Component Value Date   LABURIC 6.1 10/29/2019   No episodes/flares      Encounter for Medicare annual wellness exam - Primary    Reviewed health habits including diet and exercise and skin cancer prevention Reviewed appropriate screening tests for age  Also reviewed health mt list, fam hx and immunization status , as well as social and family history   See HPI Labs reviewed  inst pt to schedule dexa and mammogram- referrals done and phone numbers given  Pt has adv directive No cognitive concerns Hearing loss on R ear is chronic- per pt was evaluated utd eye/vision exam  Depression screen reviewed- mood is good        Estrogen deficiency    dexa ordered      Relevant Orders   DG Bone Density   Melanoma of upper arm ([place])    Due for dermatology f/u Pt wants to schedule this herself  Uses sun protection  reliably      Prediabetes    Lab Results  Component Value Date   HGBA1C 6.4 10/29/2019   This is up slightly  disc imp of low glycemic diet and wt loss to prevent DM2        Serum calcium elevated    With known osteopenia PTH, ca/ phos ordered today      Relevant Orders   VITAMIN D 25 Hydroxy (Vit-D Deficiency, Fractures) (Completed)   Phosphorus (Completed)   PTH, intact and calcium

## 2019-11-05 NOTE — Assessment & Plan Note (Signed)
bp in fair control at this time  BP Readings from Last 1 Encounters:  11/05/19 134/68   No changes needed Most recent labs reviewed  Disc lifstyle change with low sodium diet and exercise

## 2019-11-05 NOTE — Patient Instructions (Addendum)
Please call and schedule your mammogram - at [facility]  Also your bone density test    If you are interested in the new shingles vaccine (Shingrix) - call your local pharmacy to check on coverage and availability  If affordable, get on a wait list at your pharmacy to get the vaccine.  Be sure to follow up with dermatology for melanoma f/u and skin screen   You are getting close to diabetes  Weight loss is the key to preventing diabetes Try to get most of your carbohydrates from produce (with the exception of white potatoes)  Eat less bread/pasta/rice/snack foods/cereals/sweets and other items from the middle of the grocery store (processed carbs)

## 2019-11-05 NOTE — Assessment & Plan Note (Signed)
On allopurinol Lab Results  Component Value Date   LABURIC 6.1 10/29/2019   No episodes/flares

## 2019-11-05 NOTE — Assessment & Plan Note (Signed)
Due for dermatology f/u Pt wants to schedule this herself  Uses sun protection reliably

## 2019-11-05 NOTE — Assessment & Plan Note (Signed)
dexa ordered

## 2019-11-06 LAB — PTH, INTACT AND CALCIUM
Calcium: 10.5 mg/dL — ABNORMAL HIGH (ref 8.6–10.4)
PTH: 52 pg/mL (ref 14–64)

## 2019-11-08 DIAGNOSIS — M719 Bursopathy, unspecified: Secondary | ICD-10-CM | POA: Diagnosis not present

## 2019-11-08 DIAGNOSIS — M6281 Muscle weakness (generalized): Secondary | ICD-10-CM | POA: Diagnosis not present

## 2019-11-08 DIAGNOSIS — M5416 Radiculopathy, lumbar region: Secondary | ICD-10-CM | POA: Diagnosis not present

## 2019-11-11 ENCOUNTER — Telehealth: Payer: Self-pay | Admitting: *Deleted

## 2019-11-11 NOTE — Telephone Encounter (Signed)
Left VM requesting pt to call the office back regarding lab results  

## 2019-11-12 NOTE — Telephone Encounter (Signed)
Addressed through result notes  

## 2019-11-14 DIAGNOSIS — M5416 Radiculopathy, lumbar region: Secondary | ICD-10-CM | POA: Diagnosis not present

## 2019-11-15 ENCOUNTER — Telehealth: Payer: Self-pay

## 2019-11-15 ENCOUNTER — Encounter: Payer: Self-pay | Admitting: Family Medicine

## 2019-11-15 NOTE — Telephone Encounter (Signed)
I sent an email

## 2019-11-15 NOTE — Telephone Encounter (Signed)
Patient called stating that Dr Glori Bickers told her during last visit of a book for her husband, who has dementia, and she forgot the name of it. Patient asked if we could send her a mychart message with the name. I advised patient that Dr Glori Bickers is out of the office and it will probably be next week before she hears back from [place]. Patient is fine with that

## 2019-11-19 DIAGNOSIS — H25812 Combined forms of age-related cataract, left eye: Secondary | ICD-10-CM | POA: Diagnosis not present

## 2019-11-19 DIAGNOSIS — H524 Presbyopia: Secondary | ICD-10-CM | POA: Diagnosis not present

## 2019-11-19 DIAGNOSIS — H25811 Combined forms of age-related cataract, right eye: Secondary | ICD-10-CM | POA: Diagnosis not present

## 2019-11-19 DIAGNOSIS — H25813 Combined forms of age-related cataract, bilateral: Secondary | ICD-10-CM | POA: Diagnosis not present

## 2019-11-19 DIAGNOSIS — H43811 Vitreous degeneration, right eye: Secondary | ICD-10-CM | POA: Diagnosis not present

## 2019-11-20 DIAGNOSIS — M5416 Radiculopathy, lumbar region: Secondary | ICD-10-CM | POA: Diagnosis not present

## 2019-11-20 DIAGNOSIS — M6281 Muscle weakness (generalized): Secondary | ICD-10-CM | POA: Diagnosis not present

## 2019-11-20 DIAGNOSIS — M719 Bursopathy, unspecified: Secondary | ICD-10-CM | POA: Diagnosis not present

## 2019-11-26 ENCOUNTER — Ambulatory Visit: Payer: Medicare Other | Attending: Internal Medicine

## 2019-11-26 DIAGNOSIS — Z23 Encounter for immunization: Secondary | ICD-10-CM

## 2019-11-26 DIAGNOSIS — M5416 Radiculopathy, lumbar region: Secondary | ICD-10-CM | POA: Diagnosis not present

## 2019-11-26 NOTE — Progress Notes (Signed)
   Covid-19 Vaccination Clinic  Name:  Andrea Peters    MRN: [id number] DOB: [birth year]  11/26/2019  [name] was observed post Covid-19 immunization for 30 minutes based on pre-vaccination screening without incident. She was provided with Vaccine Information Sheet and instruction to access the V-Safe system.   [name] was instructed to call 911 with any severe reactions post vaccine: Marland Kitchen Difficulty breathing  . Swelling of face and throat  . A fast heartbeat  . A bad rash all over body  . Dizziness and weakness   Immunizations Administered    Name Date Dose VIS Date Route   Pfizer COVID-19 Vaccine 11/26/2019  2:02 PM 0.3 mL 08/16/2019 Intramuscular   Manufacturer: [place]   Lot: [id number]   [place]: [address]-6

## 2019-11-27 ENCOUNTER — Telehealth: Payer: Self-pay

## 2019-11-27 MED ORDER — TELMISARTAN-HCTZ 80-12.5 MG PO TABS: 2.0000 | ORAL_TABLET | Freq: Every day | ORAL | 3 refills | Status: DC

## 2019-11-27 NOTE — Telephone Encounter (Signed)
Pt had [place] on 11/05/19 and when telmisartan HCTZ 80-12.5 mg was refilled the instructions were changed to take 1 daily and pt said no one told her to change the dosage so pt has been taking 2 tabs po daily as she has always done. Pt request new rx sent to [place] [facility]  With instructions to take 2 tabs po daily.pt request cb after reviewed by Dr Glori Bickers. Pt said cb on  11/28/19 is OK. Please advise.

## 2019-12-04 DIAGNOSIS — M719 Bursopathy, unspecified: Secondary | ICD-10-CM | POA: Diagnosis not present

## 2019-12-04 DIAGNOSIS — M6281 Muscle weakness (generalized): Secondary | ICD-10-CM | POA: Diagnosis not present

## 2019-12-04 DIAGNOSIS — M5416 Radiculopathy, lumbar region: Secondary | ICD-10-CM | POA: Diagnosis not present

## 2019-12-10 ENCOUNTER — Other Ambulatory Visit: Payer: Self-pay | Admitting: Family Medicine

## 2019-12-10 DIAGNOSIS — Z1231 Encounter for screening mammogram for malignant neoplasm of breast: Secondary | ICD-10-CM

## 2019-12-12 DIAGNOSIS — R29898 Other symptoms and signs involving the musculoskeletal system: Secondary | ICD-10-CM | POA: Diagnosis not present

## 2019-12-26 ENCOUNTER — Other Ambulatory Visit: Payer: Self-pay | Admitting: Family Medicine

## 2019-12-28 ENCOUNTER — Other Ambulatory Visit: Payer: Self-pay | Admitting: Family Medicine

## 2019-12-30 NOTE — Telephone Encounter (Signed)
Pt left v/m requesting cb with status of refill request for allopurinol; pt is out of med.

## 2020-01-07 DIAGNOSIS — M5416 Radiculopathy, lumbar region: Secondary | ICD-10-CM | POA: Diagnosis not present

## 2020-01-07 DIAGNOSIS — M5126 Other intervertebral disc displacement, lumbar region: Secondary | ICD-10-CM | POA: Diagnosis not present

## 2020-01-13 ENCOUNTER — Ambulatory Visit
Admission: RE | Admit: 2020-01-13 | Discharge: 2020-01-13 | Disposition: A | Discharge status: Home / Self Care | Payer: Medicare Other | Source: Ambulatory Visit | Attending: Family Medicine | Admitting: Family Medicine

## 2020-01-13 ENCOUNTER — Other Ambulatory Visit: Payer: Self-pay | Admitting: Family Medicine

## 2020-01-13 DIAGNOSIS — E2839 Other primary ovarian failure: Secondary | ICD-10-CM | POA: Diagnosis not present

## 2020-01-13 DIAGNOSIS — M85852 Other specified disorders of bone density and structure, left thigh: Secondary | ICD-10-CM | POA: Diagnosis not present

## 2020-01-13 DIAGNOSIS — Z1231 Encounter for screening mammogram for malignant neoplasm of breast: Secondary | ICD-10-CM | POA: Diagnosis not present

## 2020-01-13 DIAGNOSIS — R928 Other abnormal and inconclusive findings on diagnostic imaging of breast: Secondary | ICD-10-CM

## 2020-01-18 ENCOUNTER — Other Ambulatory Visit: Payer: Self-pay | Admitting: Family Medicine

## 2020-01-27 ENCOUNTER — Ambulatory Visit
Admission: RE | Admit: 2020-01-27 | Discharge: 2020-01-27 | Disposition: A | Discharge status: Home / Self Care | Payer: Medicare Other | Source: Ambulatory Visit | Attending: Family Medicine | Admitting: Family Medicine

## 2020-01-27 DIAGNOSIS — R928 Other abnormal and inconclusive findings on diagnostic imaging of breast: Secondary | ICD-10-CM

## 2020-01-30 DIAGNOSIS — M5126 Other intervertebral disc displacement, lumbar region: Secondary | ICD-10-CM | POA: Diagnosis not present

## 2020-01-30 DIAGNOSIS — M5416 Radiculopathy, lumbar region: Secondary | ICD-10-CM | POA: Diagnosis not present

## 2020-01-30 DIAGNOSIS — M5136 Other intervertebral disc degeneration, lumbar region: Secondary | ICD-10-CM | POA: Diagnosis not present

## 2020-01-31 ENCOUNTER — Other Ambulatory Visit: Payer: Self-pay | Admitting: Family Medicine

## 2020-01-31 NOTE — Telephone Encounter (Signed)
Last filled on 11/04/19 #90 tabs with 0 refills, CPE was on 11/05/19 and a f/u to discuss DEXA results scheduled on 02/11/20

## 2020-02-10 ENCOUNTER — Telehealth: Payer: Self-pay | Admitting: Family Medicine

## 2020-02-10 NOTE — Telephone Encounter (Signed)
-----   Message from Cloyd Stagers, RT sent at 02/10/2020  4:18 PM EDT ----- Regarding: Lab Orders for Tuesday 6.8.2021 Please place lab orders for Tuesday 6.8.2021, appt notes state "lab" Thank you, Dyke Maes RT(R)

## 2020-02-11 ENCOUNTER — Ambulatory Visit (INDEPENDENT_AMBULATORY_CARE_PROVIDER_SITE_OTHER): Payer: Medicare Other | Admitting: Family Medicine

## 2020-02-11 ENCOUNTER — Other Ambulatory Visit: Payer: Medicare Other

## 2020-02-11 ENCOUNTER — Other Ambulatory Visit: Payer: Self-pay

## 2020-02-11 ENCOUNTER — Other Ambulatory Visit (INDEPENDENT_AMBULATORY_CARE_PROVIDER_SITE_OTHER): Payer: Medicare Other

## 2020-02-11 ENCOUNTER — Encounter: Payer: Self-pay | Admitting: Family Medicine

## 2020-02-11 VITALS — BP 136/72 | HR 61 | Temp 96.9°F | Ht 61.0 in | Wt 201.1 lb

## 2020-02-11 DIAGNOSIS — M8589 Other specified disorders of bone density and structure, multiple sites: Secondary | ICD-10-CM | POA: Diagnosis not present

## 2020-02-11 DIAGNOSIS — R7303 Prediabetes: Secondary | ICD-10-CM | POA: Diagnosis not present

## 2020-02-11 LAB — HEMOGLOBIN A1C: Hgb A1c MFr Bld: 6.6 % — ABNORMAL HIGH (ref 4.6–6.5)

## 2020-02-11 MED ORDER — ALENDRONATE SODIUM 70 MG PO TABS
70.0000 mg | ORAL_TABLET | ORAL | 11 refills | Status: DC
Start: 2020-02-11 — End: 2020-02-11

## 2020-02-11 MED ORDER — ALENDRONATE SODIUM 70 MG PO TABS
70.0000 mg | ORAL_TABLET | ORAL | 3 refills | Status: DC
Start: 2020-02-11 — End: 2021-01-05

## 2020-02-11 NOTE — Patient Instructions (Addendum)
Try to incorporate any type of weight bearing exercise   Continue the vitamin D   Take care of yourself   Try not to fall - your fracture risk is up   Try alendronate weekly  If any side effects stop it If you have to have dental procedures hold it and let me know

## 2020-02-11 NOTE — Assessment & Plan Note (Signed)
Mild/borderline PTH nl last time Continue to follow/esp in light of osteopenia

## 2020-02-11 NOTE — Assessment & Plan Note (Signed)
A1C today  disc imp of low glycemic diet and wt loss to prevent DM2  

## 2020-02-11 NOTE — Assessment & Plan Note (Signed)
Rev dexa in detail and rev fx history  Disc poss tx-will try a 5 y course of alendronate Disc poss side eff and inst to stop if issues Also inst to hold for major dental procedures like extractions  inst to begin wt bearing exercise when able Continue vit D Re checking ca and PTH today as well (nl PTH last time)

## 2020-02-11 NOTE — Progress Notes (Signed)
Subjective:    Patient ID: Andrea Peters, female    DOB: Apr 17, 1946, 74 y.o.   MRN: [id number]  This visit occurred during the SARS-CoV-2 public health emergency.  Safety protocols were in place, including screening questions prior to the visit, additional usage of staff PPE, and extensive cleaning of exam room while observing appropriate contact time as indicated for disinfecting solutions.    HPI Pt presents to discuss dexa results   Wt Readings from Last 3 Encounters:  02/11/20 201 lb 2 oz (91.2 kg)  11/05/19 202 lb 5 oz (91.8 kg)  10/26/18 206 lb 7 oz (93.6 kg)   38.00 kg/m   Unsure if open to exercise  Cares for husband and hard to get away to exercise    dexa 01/13/20  LS T score -0.9 Dual femoral neck -2.4 (down from -2.0) Dual femur total -1.4 down from -0.8  frax 10 y prob of major OP fx is 21.5% and hip fx 5.9%  Fracture history L foot    Falls - none  Supplements - ca and D Exercise - none (but not sedentary)   Family history -no OP that she knows of  One aunt was kyphotic    Height -fairly stable     Lab Results  Component Value Date   PTH 52 11/05/2019   CALCIUM 10.5 (H) 11/05/2019   PHOS 3.0 11/05/2019    No dental issues   Patient Active Problem List   Diagnosis Date Noted   Serum calcium elevated 11/05/2019   Prediabetes 10/26/2018   Hordeolum externum of right lower eyelid 10/04/2018   Skin rash 10/04/2018   Fatigue 03/29/2016   Hypokalemia 10/19/2015   Osteopenia 10/19/2015   Estrogen deficiency 10/15/2014   Encounter for Medicare annual wellness exam 10/15/2013   Colon cancer screening 10/15/2013   Melanoma of upper arm ([place]) 05/08/2013   Gout 11/17/2010   CHOLELITHIASIS, HX OF 11/17/2010   HYPERCHOLESTEROLEMIA, PURE 05/17/2007   ALLERGIC  RHINITIS 01/23/2007   Essential hypertension 12/20/2006   FASCIITIS, PLANTAR 12/20/2006   URINARY INCONTINENCE, STRESS 12/20/2006   Past Medical History:  Diagnosis  Date   Allergic rhinitis    Hyperglycemia    Hyperlipidemia    Hypertension    Melanoma ([place])    Mixed incontinence    Obesity    Plantar fasciitis    with significant disability   Tendonitis of foot    L, chronic foot pain   Past Surgical History:  Procedure Laterality Date   ABDOMINAL HYSTERECTOMY     fibroids, ovaries intact   BREAST EXCISIONAL BIOPSY Bilateral 1960's -1970's   benign   EYE SURGERY Bilateral 12/2015   at [facility]  5/07   Tendon rupture L foot   Social History   Tobacco Use   Smoking status: Never Smoker   Smokeless tobacco: Never Used  Substance Use Topics   Alcohol use: No    Alcohol/week: 0.0 standard drinks   Drug use: No   Family History  Problem Relation Age of Onset   Heart attack Mother    Other Mother        ?thyroid problems   Lung cancer Father    Allergies  Allergen Reactions   Lipitor [Atorvastatin]     Muscle pain   Naproxen Sodium    Bioflavonoids Rash   Naproxen Other (See Comments) and Rash    Severe indigestion   Current Outpatient Medications on File Prior to Visit  Medication Sig Dispense  Refill   albuterol (PROVENTIL HFA;VENTOLIN HFA) 108 (90 Base) MCG/ACT inhaler Inhale 2 puffs into the lungs every 4 (four) hours as needed for wheezing or shortness of breath (cough, shortness of breath or wheezing.). 1 Inhaler 1   allopurinol (ZYLOPRIM) 300 MG tablet TAKE 1 TABLET BY MOUTH EVERY DAY 90 tablet 3   aspirin 81 MG tablet Take 81 mg by mouth daily.       BREO ELLIPTA 200-25 MCG/INH AEPB USE 1 INHALATION ORALLY    DAILY 180 each 1   CALCIUM-VITAMIN D PO Take 1 tablet by mouth daily.       cetirizine (ZYRTEC) 10 MG tablet Take 1 tablet (10 mg total) by mouth daily. 30 tablet 3   fluticasone (FLONASE) 50 MCG/ACT nasal spray SPRAY 2 SPRAYS INTO EACH NOSTRIL EVERY DAY 48 mL 1   guaiFENesin (MUCINEX) 600 MG 12 hr tablet Take 1,200 mg by mouth daily.     hydrochlorothiazide  (HYDRODIURIL) 25 MG tablet Take 1 tablet (25 mg total) by mouth daily. 90 tablet 3   meloxicam (MOBIC) 15 MG tablet TAKE 1 TABLET (15 MG TOTAL) BY MOUTH DAILY AS NEEDED FOR PAIN WITH FOOD 90 tablet 1   Multiple Vitamin (MULTIVITAMIN) tablet Take 2 tablets by mouth daily.      potassium chloride (KLOR-CON) 10 MEQ tablet Take 1 tablet (10 mEq total) by mouth daily. 90 tablet 3   simvastatin (ZOCOR) 20 MG tablet TAKE 1 TABLET BY MOUTH EVERYDAY AT BEDTIME 90 tablet 3   telmisartan-hydrochlorothiazide (MICARDIS HCT) 80-12.5 MG tablet Take 2 tablets by mouth daily. 180 tablet 3   colchicine 0.6 MG tablet as needed. Take 2  tablets now with food and then 1 tablet twice a day with food     No current facility-administered medications on file prior to visit.    Review of Systems  Constitutional: Negative for activity change, appetite change, fatigue, fever and unexpected weight change.  HENT: Negative for congestion, ear pain, rhinorrhea, sinus pressure and sore throat.   Eyes: Negative for pain, redness and visual disturbance.  Respiratory: Negative for cough, shortness of breath and wheezing.   Cardiovascular: Negative for chest pain and palpitations.  Gastrointestinal: Negative for abdominal pain, blood in stool, constipation and diarrhea.  Endocrine: Negative for polydipsia and polyuria.  Genitourinary: Negative for dysuria, frequency and urgency.  Musculoskeletal: Negative for arthralgias, back pain and myalgias.  Skin: Negative for pallor and rash.  Allergic/Immunologic: Negative for environmental allergies.  Neurological: Negative for dizziness, syncope and headaches.  Hematological: Negative for adenopathy. Does not bruise/bleed easily.  Psychiatric/Behavioral: Negative for decreased concentration and dysphoric mood. The patient is not nervous/anxious.        Objective:   Physical Exam Constitutional:      General: She is not in acute distress.    Appearance: Normal appearance.  She is obese. She is not ill-appearing.  HENT:     Mouth/Throat:     Mouth: Mucous membranes are moist.  Eyes:     General: No scleral icterus.    Conjunctiva/sclera: Conjunctivae normal.     Pupils: Pupils are equal, round, and reactive to light.  Neck:     Comments: No thyromegally Cardiovascular:     Rate and Rhythm: Normal rate and regular rhythm.     Pulses: Normal pulses.  Pulmonary:     Effort: Pulmonary effort is normal. No respiratory distress.  Musculoskeletal:     Cervical back: Neck supple.     Comments: No kyphosis  Small  to medium frame  Lymphadenopathy:     Cervical: No cervical adenopathy.  Skin:    General: Skin is warm and dry.     Coloration: Skin is not pale.  Neurological:     Mental Status: She is alert.     Coordination: Coordination normal.  Psychiatric:        Mood and Affect: Mood normal.           Assessment & Plan:   Problem List Items Addressed This Visit      Musculoskeletal and Integument   Osteopenia - Primary    Rev dexa in detail and rev fx history  Disc poss tx-will try a 5 y course of alendronate Disc poss side eff and inst to stop if issues Also inst to hold for major dental procedures like extractions  inst to begin wt bearing exercise when able Continue vit D Re checking ca and PTH today as well (nl PTH last time)         Other   Prediabetes    A1C today  disc imp of low glycemic diet and wt loss to prevent DM2       Relevant Orders   Hemoglobin A1c   Serum calcium elevated    Mild/borderline PTH nl last time Continue to follow/esp in light of osteopenia

## 2020-02-12 LAB — PTH, INTACT AND CALCIUM
Calcium: 10.4 mg/dL (ref 8.6–10.4)
PTH: 47 pg/mL (ref 14–64)

## 2020-02-20 ENCOUNTER — Other Ambulatory Visit: Payer: Self-pay | Admitting: Family Medicine

## 2020-05-02 ENCOUNTER — Other Ambulatory Visit: Payer: Self-pay | Admitting: Family Medicine

## 2020-05-04 NOTE — Telephone Encounter (Signed)
3 month f/u scheduled on 05/12/20

## 2020-05-12 ENCOUNTER — Other Ambulatory Visit: Payer: Self-pay

## 2020-05-12 ENCOUNTER — Ambulatory Visit (INDEPENDENT_AMBULATORY_CARE_PROVIDER_SITE_OTHER): Payer: Medicare Other | Admitting: Family Medicine

## 2020-05-12 ENCOUNTER — Encounter: Payer: Self-pay | Admitting: Family Medicine

## 2020-05-12 VITALS — BP 120/80 | HR 77 | Temp 96.8°F | Ht 61.0 in | Wt 194.3 lb

## 2020-05-12 DIAGNOSIS — R7303 Prediabetes: Secondary | ICD-10-CM | POA: Diagnosis not present

## 2020-05-12 DIAGNOSIS — I10 Essential (primary) hypertension: Secondary | ICD-10-CM

## 2020-05-12 DIAGNOSIS — Z23 Encounter for immunization: Secondary | ICD-10-CM | POA: Diagnosis not present

## 2020-05-12 LAB — POCT GLYCOSYLATED HEMOGLOBIN (HGB A1C): Hemoglobin A1C: 5.9 % — AB (ref 4.0–5.6)

## 2020-05-12 NOTE — Patient Instructions (Addendum)
Try to get most of your carbohydrates from produce (with the exception of white potatoes)  Eat less bread/pasta/rice/snack foods/cereals/sweets and other items from the middle of the grocery store (processed carbs)   Add some intentional exercise - walking/ whatever you like to do (what you hate the least)  We recommend 30 minutes daily - work up to it self   Your sugar control is better  A1C is down to 5.9  Stay off the ice cream   If your foot does not continue to improve please let us know

## 2020-05-12 NOTE — Assessment & Plan Note (Signed)
Big improvement with better diet (primarily cutting out ice cream) and 5 lb wt loss Lab Results  Component Value Date   HGBA1C 5.9 (A) 05/12/2020   Down from 6.6  disc imp of low glycemic diet and wt loss to prevent DM2  Enc her to keep working on it  Adding exercise would help as well-discussed this

## 2020-05-12 NOTE — Assessment & Plan Note (Signed)
bp in fair control at this time  BP Readings from Last 1 Encounters:  05/12/20 120/80   No changes needed Most recent labs reviewed  Disc lifstyle change with low sodium diet and exercise  Commended on 5 lb wt loss

## 2020-05-12 NOTE — Progress Notes (Signed)
Subjective:    Patient ID: Andrea Peters, female    DOB: 20-Feb-1946, 74 y.o.   MRN: [id number]  This visit occurred during the SARS-CoV-2 public health emergency.  Safety protocols were in place, including screening questions prior to the visit, additional usage of staff PPE, and extensive cleaning of exam room while observing appropriate contact time as indicated for disinfecting solutions.    HPI  Pt presents for 3 mo f/u of chronic medical problems  Wt Readings from Last 3 Encounters:  05/12/20 194 lb 4.8 oz (88.1 kg)  02/11/20 201 lb 2 oz (91.2 kg)  11/05/19 202 lb 5 oz (91.8 kg)   36.71 kg/m   Down 5 lb  Has not changed diet very much  She quit buying chocolate mint ice cream    Doing ok  Staying in with pandemic Husband is getting some mild dementia  She is covid immunized   Had her flu shot today   H/o prediabetes  Last a1c was up  Lab Results  Component Value Date   HGBA1C 6.6 (H) 02/11/2020   Needs re check  Had counseled on diet   She stopped ice cream  Not a sweet eater as a rule  Does not eat much in terms of processed carbs   Does not eat large portions   No deliberate exercise but is on feet all day    Lab Results  Component Value Date   HGBA1C 5.9 (A) 05/12/2020    HTN bp is stable today  No cp or palpitations or headaches or edema  No side effects to medicines  Takes micardis hct and also additional hctz  bp is stable today  No cp or palpitations or headaches or edema  No side effects to medicines  BP Readings from Last 3 Encounters:  05/12/20 120/80  02/11/20 136/72  11/05/19 134/68       Takes simvastatin for cholesterol  Lab Results  Component Value Date   CHOL 143 10/29/2019   HDL 45.70 10/29/2019   LDLCALC 75 10/29/2019   LDLDIRECT 141.9 01/23/2007   TRIG 112.0 10/29/2019   CHOLHDL 3 10/29/2019    She stepped in a hole 2 wk ago- having pain laterally ankle /foot  Does not want an xray  Thinks getting better  -declines xr or eval today  Has known bone loss/takign fosamax  Patient Active Problem List   Diagnosis Date Noted  . Serum calcium elevated 11/05/2019  . Prediabetes 10/26/2018  . Hordeolum externum of right lower eyelid 10/04/2018  . Skin rash 10/04/2018  . Fatigue 03/29/2016  . Hypokalemia 10/19/2015  . Osteopenia 10/19/2015  . Estrogen deficiency 10/15/2014  . Encounter for Medicare annual wellness exam 10/15/2013  . Colon cancer screening 10/15/2013  . Melanoma of upper arm ([place]) 05/08/2013  . Gout 11/17/2010  . CHOLELITHIASIS, HX OF 11/17/2010  . HYPERCHOLESTEROLEMIA, PURE 05/17/2007  . ALLERGIC  RHINITIS 01/23/2007  . Essential hypertension 12/20/2006  . [place], [place] 12/20/2006  . URINARY INCONTINENCE, STRESS 12/20/2006   Past Medical History:  Diagnosis Date  . Allergic rhinitis   . Hyperglycemia   . Hyperlipidemia   . Hypertension   . Melanoma ([place])   . Mixed incontinence   . Obesity   . Plantar fasciitis    with significant disability  . Tendonitis of foot    L, chronic foot pain   Past Surgical History:  Procedure Laterality Date  . ABDOMINAL HYSTERECTOMY     fibroids, ovaries intact  . BREAST  EXCISIONAL BIOPSY Bilateral 1960's -1970's   benign  . EYE SURGERY [facility]ilateral 12/2015   at [facility]  . TENDON REPAIR  5/07   Tendon rupture L foot   Social History   Tobacco Use  . Smoking status: Never Smoker  . Smokeless tobacco: Never Used  Vaping Use  . Vaping Use: Never used  Substance Use Topics  . Alcohol use: No    Alcohol/week: 0.0 standard drinks  . Drug use: No   Family History  Problem Relation Age of Onset  . Heart attack Mother   . Other Mother        ?thyroid problems  . Lung cancer Father    Allergies  Allergen Reactions  . Lipitor [Atorvastatin]     Muscle pain  . Naproxen Sodium   . Bioflavonoids Rash  . Naproxen Other (See Comments) and Rash    Severe indigestion   Current Outpatient Medications on File Prior to Visit    Medication Sig Dispense Refill  . albuterol (PROVENTIL HFA;VENTOLIN HFA) 108 (90 Base) MCG/ACT inhaler Inhale 2 puffs into the lungs every 4 (four) hours as needed for wheezing or shortness of breath (cough, shortness of breath or wheezing.). 1 Inhaler 1  . alendronate (FOSAMAX) 70 MG tablet Take 1 tablet (70 mg total) by mouth every 7 (seven) days. Take with a full glass of water on an empty stomach. 12 tablet 3  . allopurinol (ZYLOPRIM) 300 MG tablet TAKE 1 TABLET BY MOUTH EVERY DAY 90 tablet 3  . aspirin 81 MG tablet Take 81 mg by mouth daily.      Marland Kitchen BREO ELLIPTA 200-25 MCG/INH AEPB USE 1 INHALATION ORALLY    DAILY 180 each 1  . CALCIUM-VITAMIN D PO Take 1 tablet by mouth daily.      . cetirizine (ZYRTEC) 10 MG tablet Take 1 tablet (10 mg total) by mouth daily. 30 tablet 3  . fluticasone (FLONASE) 50 MCG/ACT nasal spray SPRAY 2 SPRAYS INTO EACH NOSTRIL EVERY DAY 48 mL 1  . guaiFENesin (MUCINEX) 600 MG 12 hr tablet Take 1,200 mg by mouth daily.    . hydrochlorothiazide (HYDRODIURIL) 25 MG tablet Take 1 tablet (25 mg total) by mouth daily. 90 tablet 3  . meloxicam (MOBIC) 15 MG tablet TAKE 1 TABLET (15 MG TOTAL) BY MOUTH DAILY AS NEEDED FOR PAIN WITH FOOD 90 tablet 1  . Multiple Vitamin (MULTIVITAMIN) tablet Take 2 tablets by mouth daily.     . potassium chloride (KLOR-CON) 10 MEQ tablet Take 1 tablet (10 mEq total) by mouth daily. 90 tablet 3  . simvastatin (ZOCOR) 20 MG tablet TAKE 1 TABLET BY MOUTH EVERYDAY AT BEDTIME 90 tablet 3  . telmisartan-hydrochlorothiazide (MICARDIS HCT) 80-12.5 MG tablet Take 2 tablets by mouth daily. 180 tablet 3  . colchicine 0.6 MG tablet as needed. Take 2  tablets now with food and then 1 tablet twice a day with food     No current facility-administered medications on file prior to visit.    Review of Systems  Constitutional: Negative for activity change, appetite change, fatigue, fever and unexpected weight change.  HENT: Negative for congestion, ear pain,  rhinorrhea, sinus pressure and sore throat.   Eyes: Negative for pain, redness and visual disturbance.  Respiratory: Negative for cough, shortness of breath and wheezing.   Cardiovascular: Negative for chest pain and palpitations.  Gastrointestinal: Negative for abdominal pain, blood in stool, constipation and diarrhea.  Endocrine: Negative for polydipsia and polyuria.  Genitourinary: Negative for dysuria,  frequency and urgency.  Musculoskeletal: Negative for arthralgias, back pain and myalgias.       R foot injury  Skin: Negative for pallor and rash.  Allergic/Immunologic: Negative for environmental allergies.  Neurological: Negative for dizziness, syncope and headaches.  Hematological: Negative for adenopathy. Does not bruise/bleed easily.  Psychiatric/Behavioral: Negative for decreased concentration and dysphoric mood. The patient is not nervous/anxious.        Objective:   Physical Exam Constitutional:      General: She is not in acute distress.    Appearance: Normal appearance. She is well-developed. She is obese. She is not ill-appearing.  HENT:     Head: Normocephalic and atraumatic.  Eyes:     General: No scleral icterus.    Conjunctiva/sclera: Conjunctivae normal.     Pupils: Pupils are equal, round, and reactive to light.  Neck:     Thyroid: No thyromegaly.     Vascular: No carotid bruit or JVD.  Cardiovascular:     Rate and Rhythm: Normal rate and regular rhythm.     Heart sounds: Normal heart sounds. No gallop.   Pulmonary:     Effort: Pulmonary effort is normal. No respiratory distress.     Breath sounds: Normal breath sounds. No wheezing or rales.  Abdominal:     General: There is no abdominal bruit.  Musculoskeletal:     Cervical back: Normal range of motion and neck supple.     Right lower leg: No edema.     Left lower leg: No edema.  Lymphadenopathy:     Cervical: No cervical adenopathy.  Skin:    General: Skin is warm and dry.     Findings: No rash.    Neurological:     Mental Status: She is alert.     Deep Tendon Reflexes: Reflexes are normal and symmetric.  Psychiatric:        Mood and Affect: Mood normal.           Assessment & Plan:   Problem List Items Addressed This Visit      Cardiovascular and Mediastinum   Essential hypertension    bp in fair control at this time  BP Readings from Last 1 Encounters:  05/12/20 120/80   No changes needed Most recent labs reviewed  Disc lifstyle change with low sodium diet and exercise  Commended on 5 lb wt loss        Other   Prediabetes - Primary    Big improvement with better diet (primarily cutting out ice cream) and 5 lb wt loss Lab Results  Component Value Date   HGBA1C 5.9 (A) 05/12/2020   Down from 6.6  disc imp of low glycemic diet and wt loss to prevent DM2  Enc her to keep working on it  Adding exercise would help as well-discussed this       Relevant Orders   POCT glycosylated hemoglobin (Hb A1C) (Completed)    Other Visit Diagnoses    Need for immunization against influenza       Relevant Orders   Flu Vaccine QUAD High Dose(Fluad) (Completed)

## 2020-07-15 ENCOUNTER — Other Ambulatory Visit: Payer: Self-pay | Admitting: Family Medicine

## 2020-07-21 ENCOUNTER — Telehealth: Payer: Self-pay

## 2020-07-21 MED ORDER — BREO ELLIPTA 200-25 MCG/INH IN AEPB: INHALATION_SPRAY | RESPIRATORY_TRACT | 1 refills | Status: DC

## 2020-07-21 NOTE — Telephone Encounter (Signed)
Pt left v/m that recent Breo refill was sent to [facility] for 30 day supply and pt usually gets 90 day supply and pt has to pay same amt if 30 day or 90 day supply. Pt request cb to see how to handle this.

## 2020-07-21 NOTE — Telephone Encounter (Signed)
Looks like a float filled med, since mail order pharmacy it should have been 3 months supply, new Rx sent to pharmacy

## 2020-07-21 NOTE — Telephone Encounter (Signed)
Pt.notified

## 2020-07-28 ENCOUNTER — Other Ambulatory Visit: Payer: Self-pay | Admitting: Family Medicine

## 2020-07-29 NOTE — Telephone Encounter (Signed)
meloxicam last filled on 01/31/20 #90 with 1 refills, flonase also due. Last OV was a f/u on 05/12/20

## 2020-08-14 ENCOUNTER — Telehealth: Payer: Self-pay

## 2020-08-14 NOTE — Telephone Encounter (Signed)
Pt left a message on triage VM stating she had some questions about Fosamax side effects she thinks she is having.   I called and left her a message to find out what side effects she is having.

## 2020-08-14 NOTE — Telephone Encounter (Signed)
Hair loss is not listed as a side effect to fosamax but I do think it can be a rare side effect (anecdotal evidence)  All the medicines for bone density can cause osteoneocrosis of the jaw and I recommend holding medication for the implants and healing after  (can see if hair improves at that time)   Prolia , a twice yearly injection is a medicine I like for bone density- please look it up to see if interested. If so- will need to check insurance coverage status  because it can he hard to get covered   If she has already taken that let me know

## 2020-08-14 NOTE — Telephone Encounter (Signed)
Pt called back; pt is taking fosamax for 6 months; pt noticing hair line is receding and now the hair is thinning; pt usually has thick hair and pt and her beautician has noticed pts hair is definitely thinning but not coming out in clumps or handfulls. Pt wants to know if could get substitute med for fosamax. [facility]. While pt was reading about the hair thinning and pt also noticed about possible problems taking fosamax and other bone meds if having any dental issues. 09/10/19 thru the month of Jan pt having dental implant done and not sure exactly what will be done at each visit but should pt be off fosamax while getting dental work done. Pt request cb after reviewed by Dr Glori Bickers.

## 2020-08-18 NOTE — Telephone Encounter (Signed)
Pt notified of Dr. Marliss Coots comments she will hold med until after dental procedures and she's healed up. Pt will also look into prolia to see if that's something she wants to proceed with. Pt will update [place] once she's done and healed from her dental procedures and let us know

## 2020-08-18 NOTE — Telephone Encounter (Signed)
Patient left a voicemail requesting a call back about Fosamax.

## 2020-10-21 ENCOUNTER — Other Ambulatory Visit: Payer: Self-pay | Admitting: Family Medicine

## 2020-12-22 ENCOUNTER — Other Ambulatory Visit: Payer: Self-pay | Admitting: Family Medicine

## 2020-12-28 ENCOUNTER — Other Ambulatory Visit: Payer: Self-pay | Admitting: Family Medicine

## 2021-01-05 ENCOUNTER — Other Ambulatory Visit: Payer: Self-pay | Admitting: Family Medicine

## 2021-01-19 ENCOUNTER — Other Ambulatory Visit: Payer: Self-pay | Admitting: Family Medicine

## 2021-01-22 ENCOUNTER — Other Ambulatory Visit: Payer: Self-pay | Admitting: Family Medicine

## 2021-01-22 NOTE — Telephone Encounter (Signed)
Last f/u was on 05/12/20, last filled on 07/29/20 #90 tabs 1 refills

## 2021-01-25 ENCOUNTER — Other Ambulatory Visit: Payer: Self-pay | Admitting: Family Medicine

## 2021-01-30 ENCOUNTER — Telehealth: Payer: Self-pay | Admitting: Family Medicine

## 2021-02-02 NOTE — Telephone Encounter (Signed)
Dr Glori Bickers, LOV was on 05/06/20 routine follow up and no future appointments made. When does patient need to come back in?

## 2021-02-02 NOTE — Telephone Encounter (Signed)
Please schedule annual exam in late summer and refill until then

## 2021-02-03 NOTE — Telephone Encounter (Signed)
BP med refilled once will route to [place] to f/u with pt and try and get appt scheduled

## 2021-02-04 ENCOUNTER — Other Ambulatory Visit: Payer: Self-pay | Admitting: Family Medicine

## 2021-02-04 NOTE — Telephone Encounter (Signed)
Patient is scheduled. EM

## 2021-02-05 ENCOUNTER — Other Ambulatory Visit: Payer: Self-pay | Admitting: Family Medicine

## 2021-02-22 ENCOUNTER — Telehealth: Payer: Self-pay | Admitting: Family Medicine

## 2021-02-22 DIAGNOSIS — R7303 Prediabetes: Secondary | ICD-10-CM

## 2021-02-22 DIAGNOSIS — M1000 Idiopathic gout, unspecified site: Secondary | ICD-10-CM

## 2021-02-22 DIAGNOSIS — E7800 Pure hypercholesterolemia, unspecified: Secondary | ICD-10-CM

## 2021-02-22 DIAGNOSIS — I10 Essential (primary) hypertension: Secondary | ICD-10-CM

## 2021-02-22 NOTE — Telephone Encounter (Signed)
-----   Message from Cloyd Stagers, RT sent at 02/08/2021  4:36 PM EDT ----- Regarding: Lab Orders for Tuesday 6.21.2022 Please place lab orders for Tuesday 6.21.2022, office visit for physical on Tuesday 6.28.2022 Thank you, Dyke Maes RT(R)

## 2021-02-23 ENCOUNTER — Other Ambulatory Visit: Payer: Self-pay

## 2021-02-23 ENCOUNTER — Other Ambulatory Visit (INDEPENDENT_AMBULATORY_CARE_PROVIDER_SITE_OTHER): Payer: Medicare Other

## 2021-02-23 DIAGNOSIS — M1000 Idiopathic gout, unspecified site: Secondary | ICD-10-CM | POA: Diagnosis not present

## 2021-02-23 DIAGNOSIS — I10 Essential (primary) hypertension: Secondary | ICD-10-CM | POA: Diagnosis not present

## 2021-02-23 DIAGNOSIS — R7303 Prediabetes: Secondary | ICD-10-CM | POA: Diagnosis not present

## 2021-02-23 DIAGNOSIS — E7800 Pure hypercholesterolemia, unspecified: Secondary | ICD-10-CM | POA: Diagnosis not present

## 2021-02-23 LAB — CBC WITH DIFFERENTIAL/PLATELET
Basophils Absolute: 0 10*3/uL (ref 0.0–0.1)
Basophils Relative: 0.6 % (ref 0.0–3.0)
Eosinophils Absolute: 0.3 10*3/uL (ref 0.0–0.7)
Eosinophils Relative: 3.9 % (ref 0.0–5.0)
HCT: 40.4 % (ref 36.0–46.0)
Hemoglobin: 13.2 g/dL (ref 12.0–15.0)
Lymphocytes Relative: 21.5 % (ref 12.0–46.0)
Lymphs Abs: 1.6 10*3/uL (ref 0.7–4.0)
MCHC: 32.6 g/dL (ref 30.0–36.0)
MCV: 96.8 fl (ref 78.0–100.0)
Monocytes Absolute: 0.5 10*3/uL (ref 0.1–1.0)
Monocytes Relative: 6.7 % (ref 3.0–12.0)
Neutro Abs: 5 10*3/uL (ref 1.4–7.7)
Neutrophils Relative %: 67.3 % (ref 43.0–77.0)
Platelets: 260 10*3/uL (ref 150.0–400.0)
RBC: 4.18 Mil/uL (ref 3.87–5.11)
RDW: 13.8 % (ref 11.5–15.5)
WBC: 7.4 10*3/uL (ref 4.0–10.5)

## 2021-02-23 LAB — URIC ACID: Uric Acid, Serum: 5 mg/dL (ref 2.4–7.0)

## 2021-02-23 LAB — LIPID PANEL
Cholesterol: 152 mg/dL (ref 0–200)
HDL: 56.5 mg/dL (ref >39.00)
LDL Cholesterol: 84 mg/dL (ref 0–99)
NonHDL: 95.42
Total CHOL/HDL Ratio: 3
Triglycerides: 58 mg/dL (ref 0.0–149.0)
VLDL: 11.6 mg/dL (ref 0.0–40.0)

## 2021-02-23 LAB — COMPREHENSIVE METABOLIC PANEL
ALT: 11 U/L (ref 0–35)
AST: 13 U/L (ref 0–37)
Albumin: 4.3 g/dL (ref 3.5–5.2)
Alkaline Phosphatase: 68 U/L (ref 39–117)
BUN: 33 mg/dL — ABNORMAL HIGH (ref 6–23)
CO2: 33 mEq/L — ABNORMAL HIGH (ref 19–32)
Calcium: 10.7 mg/dL — ABNORMAL HIGH (ref 8.4–10.5)
Chloride: 101 mEq/L (ref 96–112)
Creatinine, Ser: 1.07 mg/dL (ref 0.40–1.20)
GFR: 50.45 mL/min — ABNORMAL LOW (ref >60.00)
Glucose, Bld: 101 mg/dL — ABNORMAL HIGH (ref 70–99)
Potassium: 4.4 mEq/L (ref 3.5–5.1)
Sodium: 141 mEq/L (ref 135–145)
Total Bilirubin: 0.7 mg/dL (ref 0.2–1.2)
Total Protein: 6.5 g/dL (ref 6.0–8.3)

## 2021-02-23 LAB — HEMOGLOBIN A1C: Hgb A1c MFr Bld: 6.2 % (ref 4.6–6.5)

## 2021-02-23 LAB — TSH: TSH: 4.65 u[IU]/mL — ABNORMAL HIGH (ref 0.35–4.50)

## 2021-02-23 LAB — VITAMIN D 25 HYDROXY (VIT D DEFICIENCY, FRACTURES): VITD: 70.87 ng/mL (ref 30.00–100.00)

## 2021-03-02 ENCOUNTER — Other Ambulatory Visit: Payer: Self-pay

## 2021-03-02 ENCOUNTER — Ambulatory Visit (INDEPENDENT_AMBULATORY_CARE_PROVIDER_SITE_OTHER): Payer: Medicare Other | Admitting: Family Medicine

## 2021-03-02 ENCOUNTER — Encounter: Payer: Self-pay | Admitting: Family Medicine

## 2021-03-02 VITALS — BP 131/77 | HR 58 | Temp 96.9°F | Ht 61.0 in | Wt 194.0 lb

## 2021-03-02 DIAGNOSIS — E038 Other specified hypothyroidism: Secondary | ICD-10-CM | POA: Diagnosis not present

## 2021-03-02 DIAGNOSIS — E7800 Pure hypercholesterolemia, unspecified: Secondary | ICD-10-CM

## 2021-03-02 DIAGNOSIS — Z0000 Encounter for general adult medical examination without abnormal findings: Secondary | ICD-10-CM | POA: Diagnosis not present

## 2021-03-02 DIAGNOSIS — I10 Essential (primary) hypertension: Secondary | ICD-10-CM

## 2021-03-02 DIAGNOSIS — M8589 Other specified disorders of bone density and structure, multiple sites: Secondary | ICD-10-CM

## 2021-03-02 DIAGNOSIS — R7303 Prediabetes: Secondary | ICD-10-CM

## 2021-03-02 DIAGNOSIS — Z1231 Encounter for screening mammogram for malignant neoplasm of breast: Secondary | ICD-10-CM | POA: Diagnosis not present

## 2021-03-02 DIAGNOSIS — Z1211 Encounter for screening for malignant neoplasm of colon: Secondary | ICD-10-CM

## 2021-03-02 DIAGNOSIS — M1000 Idiopathic gout, unspecified site: Secondary | ICD-10-CM | POA: Diagnosis not present

## 2021-03-02 MED ORDER — POTASSIUM CHLORIDE ER 10 MEQ PO TBCR: 10.0000 meq | EXTENDED_RELEASE_TABLET | Freq: Every day | ORAL | 3 refills | Status: DC

## 2021-03-02 MED ORDER — MELOXICAM 15 MG PO TABS: 15.0000 mg | ORAL_TABLET | Freq: Every day | ORAL | 3 refills | Status: DC | PRN

## 2021-03-02 MED ORDER — TELMISARTAN-HCTZ 80-12.5 MG PO TABS: 2.0000 | ORAL_TABLET | Freq: Every day | ORAL | 3 refills | Status: DC

## 2021-03-02 MED ORDER — SIMVASTATIN 20 MG PO TABS: ORAL_TABLET | ORAL | 3 refills | Status: DC

## 2021-03-02 MED ORDER — HYDROCHLOROTHIAZIDE 25 MG PO TABS: 25.0000 mg | ORAL_TABLET | Freq: Every day | ORAL | 3 refills | Status: DC

## 2021-03-02 NOTE — Assessment & Plan Note (Signed)
dexa 5/21 Tried alendronate and had side effects  No falls or fractures Taking D and ca  Good exercise   Disc need for calcium/ vitamin D/ wt bearing exercise and bone density test every 2 y to monitor Disc safety/ fracture risk in detail

## 2021-03-02 NOTE — Assessment & Plan Note (Signed)
Lab Results  Component Value Date   LABURIC 5.0 02/23/2021   Plans to continue allopurinol  No recent flares

## 2021-03-02 NOTE — Assessment & Plan Note (Signed)
Lab Results  Component Value Date   TSH 4.65 (H) 02/23/2021    This is new  No clinical changes  Will re check in 1 mo with FT4

## 2021-03-02 NOTE — Assessment & Plan Note (Signed)
Disc goals for lipids and reasons to control them Rev last labs with pt Rev low sat fat diet in detail Plan to continue simvastatin and diet

## 2021-03-02 NOTE — Assessment & Plan Note (Signed)
Pt declines any kind of colon cancer screening

## 2021-03-02 NOTE — Progress Notes (Signed)
Subjective:    Patient ID: Andrea Peters, female    DOB: 21-Aug-1946, 75 y.o.   MRN: [id number]  This visit occurred during the SARS-CoV-2 public health emergency.  Safety protocols were in place, including screening questions prior to the visit, additional usage of staff PPE, and extensive cleaning of exam room while observing appropriate contact time as indicated for disinfecting solutions.   HPI Pt presents for amw and annual f/u of chronic health problems  I have personally reviewed the Medicare Annual Wellness questionnaire and have noted 1. The patient's medical and social history 2. Their use of alcohol, tobacco or illicit drugs 3. Their current medications and supplements 4. The patient's functional ability including ADL's, fall risks, home safety risks and hearing or visual             impairment. 5. Diet and physical activities 6. Evidence for depression or mood disorders  The patients weight, height, BMI have been recorded in the chart and visual acuity is per eye clinic.  I have made referrals, counseling and provided education to the patient based review of the above and I have provided the pt with a written personalized care plan for preventive services. Reviewed and updated provider list, see scanned forms.  See scanned forms.  Routine anticipatory guidance given to patient.  See health maintenance. Colon cancer screening  colonoscopy 3/15 polyps [facility]) could not finish it /was a tortuous colon  Declines any more colon cancer screening  Breast cancer screening mammogram 5/21 - repeat was fine  Self breast exam-no lumps  Flu vaccine 9/21 Tetanus vaccine 5/09 Td-postponed for ins  Pneumovax completed Zoster vaccine- had the zostavax , but not the shingrix  (will check on coverage)  Dexa 5/21 osteopenia  Alendronate was px She stopped it because her hair started thinning and medicine made her feel nauseated and had diarrhea  Falls-none  Fractures-none   Supplements ca and D D level is 70.8  Exercise -walking at least 3 times per week   Advance directive- up to date  Cognitive function addressed- see scanned forms- and if abnormal then additional documentation follows.   Excellent memory and cognition  Has to do all the household tasks and affairs   PMH and SH reviewed  Meds, vitals, and allergies reviewed.   ROS: See HPI.  Otherwise negative.    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  Was told that hearing aide would not help it   Wears glasses  Time to go back for a check    Care team: [name]   Past h/o melanoma  Has a f/u planned in July with derm   HTN bp is stable today  No cp or palpitations or headaches or edema  No side effects to medicines  BP Readings from Last 3 Encounters:  03/02/21 131/77  05/12/20 120/80  02/11/20  136/72     Miacardis hct 80-12.5 daily  Hctz 25 mg daily   Lab Results  Component Value Date   CREATININE 1.07 02/23/2021   BUN 33 (H) 02/23/2021   NA 141 02/23/2021   K 4.4 02/23/2021   CL 101 02/23/2021   CO2 33 (H) 02/23/2021     H/o gout  Takes allopurinol 300 mg Lab Results  Component Value Date   LABURIC 5.0 02/23/2021    Hyperlipidemia Lab Results  Component Value Date   CHOL 152 02/23/2021   CHOL 143 10/29/2019   CHOL 147 10/23/2018   Lab Results  Component Value Date   HDL 56.50 02/23/2021   HDL 45.70 10/29/2019   HDL 59.10  10/23/2018   Lab Results  Component Value Date   LDLCALC 84 02/23/2021   LDLCALC 75 10/29/2019   LDLCALC 74 10/23/2018   Lab Results  Component Value Date   TRIG 58.0 02/23/2021   TRIG 112.0 10/29/2019   TRIG 69.0 10/23/2018   Lab Results  Component Value Date   CHOLHDL 3 02/23/2021   CHOLHDL 3 10/29/2019   CHOLHDL 2 10/23/2018   Lab Results  Component Value Date   LDLDIRECT 141.9 01/23/2007   Simvastatin 20 mg daily  Diet is not great   Prediabetes Lab Results  Component Value Date   HGBA1C 6.2 02/23/2021  She cut out ice cream (her only pleasure)  Careful about bread  Not much pasta  No chips or snack foods     Mild ca elevation  10.7 this draw  PTH was normal in 2021 twice   Other labs Lab Results  Component Value Date   WBC 7.4 02/23/2021   HGB 13.2 02/23/2021   HCT 40.4 02/23/2021   MCV 96.8 02/23/2021   PLT 260.0 02/23/2021   Lab Results  Component Value Date   ALT 11 02/23/2021   AST 13 02/23/2021   ALKPHOS 68 02/23/2021   BILITOT 0.7 02/23/2021   Lab Results  Component Value Date   TSH 4.65 (H) 02/23/2021   No goiter  No trouble swallowing Is fatigued   More aches and pains with age  L shoulder especially   Patient Active Problem List   Diagnosis Date Noted   Subclinical hypothyroidism 03/02/2021   Encounter for screening mammogram for breast cancer 03/02/2021   Serum calcium elevated 11/05/2019   Prediabetes 10/26/2018   Hordeolum externum of right lower eyelid 10/04/2018   Fatigue 03/29/2016   Hypokalemia 10/19/2015   Osteopenia 10/19/2015   Estrogen deficiency 10/15/2014   Encounter for Medicare annual wellness exam 10/15/2013   Colon cancer screening 10/15/2013   Melanoma of upper arm ([place]) 05/08/2013   Gout 11/17/2010   CHOLELITHIASIS, HX OF 11/17/2010   HYPERCHOLESTEROLEMIA, PURE 05/17/2007   ALLERGIC  RHINITIS 01/23/2007   Essential hypertension 12/20/2006   FASCIITIS, PLANTAR 12/20/2006   URINARY INCONTINENCE,  STRESS 12/20/2006   Past Medical History:  Diagnosis Date   Allergic rhinitis    Dysplastic nevus 02/06/2013   R flank - moderate   Hyperglycemia    Hyperlipidemia    Hypertension    Melanoma ([place])    Mixed incontinence    Obesity    Plantar fasciitis    with significant disability   Tendonitis of foot    L, chronic foot pain   Past Surgical History:  Procedure Laterality Date   ABDOMINAL HYSTERECTOMY     fibroids, ovaries intact   BREAST EXCISIONAL BIOPSY Bilateral 1960's -1970's   benign  EYE SURGERY Bilateral 12/2015   at [facility]  5/07   Tendon rupture L foot   Social History   Tobacco Use   Smoking status: Never   Smokeless tobacco: Never  Vaping Use   Vaping Use: Never used  Substance Use Topics   Alcohol use: No    Alcohol/week: 0.0 standard drinks   Drug use: No   Family History  Problem Relation Age of Onset   Heart attack Mother    Other Mother        ?thyroid problems   Lung cancer Father    Allergies  Allergen Reactions   Alendronate     Hair loss  Nausea Diarrhea    Lipitor [Atorvastatin]     Muscle pain   Naproxen Sodium    Bioflavonoids Rash   Naproxen Other (See Comments) and Rash    Severe indigestion   Current Outpatient Medications on File Prior to Visit  Medication Sig Dispense Refill   albuterol (PROVENTIL HFA;VENTOLIN HFA) 108 (90 Base) MCG/ACT inhaler Inhale 2 puffs into the lungs every 4 (four) hours as needed for wheezing or shortness of breath (cough, shortness of breath or wheezing.). 1 Inhaler 1   allopurinol (ZYLOPRIM) 300 MG tablet TAKE 1 TABLET BY MOUTH EVERY DAY 90 tablet 1   aspirin 81 MG tablet Take 81 mg by mouth daily.       BREO ELLIPTA 200-25 MCG/INH AEPB USE 1 INHALATION ORALLY    DAILY 180 each 1   CALCIUM-VITAMIN D PO Take 1 tablet by mouth daily.       cetirizine (ZYRTEC) 10 MG tablet Take 1 tablet (10 mg total) by mouth daily. 30 tablet 3   fluticasone (FLONASE) 50 MCG/ACT nasal spray SPRAY 2  SPRAYS INTO EACH NOSTRIL EVERY DAY 48 mL 0   guaiFENesin (MUCINEX) 600 MG 12 hr tablet Take 1,200 mg by mouth daily.     Multiple Vitamin (MULTIVITAMIN) tablet Take 2 tablets by mouth daily.      colchicine 0.6 MG tablet as needed. Take 2  tablets now with food and then 1 tablet twice a day with food     No current facility-administered medications on file prior to visit.     Review of Systems  Constitutional:  Positive for fatigue. Negative for activity change, appetite change, fever and unexpected weight change.  HENT:  Negative for congestion, ear pain, rhinorrhea, sinus pressure and sore throat.   Eyes:  Negative for pain, redness and visual disturbance.  Respiratory:  Negative for cough, shortness of breath and wheezing.   Cardiovascular:  Negative for chest pain and palpitations.  Gastrointestinal:  Negative for abdominal pain, blood in stool, constipation and diarrhea.  Endocrine: Negative for polydipsia and polyuria.  Genitourinary:  Negative for dysuria, frequency and urgency.  Musculoskeletal:  Positive for arthralgias. Negative for back pain and myalgias.  Skin:  Negative for pallor and rash.  Allergic/Immunologic: Negative for environmental allergies.  Neurological:  Negative for dizziness, syncope and headaches.  Hematological:  Negative for adenopathy. Does not bruise/bleed easily.  Psychiatric/Behavioral:  Negative for decreased concentration and dysphoric mood. The patient is not nervous/anxious.        Some caregiver stress      Objective:   Physical Exam Constitutional:      General: She is not in acute distress.    Appearance: Normal appearance. She is well-developed. She is obese. She is not ill-appearing or diaphoretic.  HENT:     Head: Normocephalic and  atraumatic.     Right Ear: Tympanic membrane, ear canal and external ear normal.     Left Ear: Tympanic membrane, ear canal and external ear normal.     Nose: Nose normal. No congestion.     Mouth/Throat:      Mouth: Mucous membranes are moist.     Pharynx: Oropharynx is clear. No posterior oropharyngeal erythema.  Eyes:     General: No scleral icterus.    Extraocular Movements: Extraocular movements intact.     Conjunctiva/sclera: Conjunctivae normal.     Pupils: Pupils are equal, round, and reactive to light.  Neck:     Thyroid: No thyromegaly.     Vascular: No carotid bruit or JVD.  Cardiovascular:     Rate and Rhythm: Normal rate and regular rhythm.     Pulses: Normal pulses.     Heart sounds: Normal heart sounds.    No gallop.  Pulmonary:     Effort: Pulmonary effort is normal. No respiratory distress.     Breath sounds: Normal breath sounds. No wheezing.     Comments: Good air exch Chest:     Chest wall: No tenderness.  Abdominal:     General: Bowel sounds are normal. There is no distension or abdominal bruit.     Palpations: Abdomen is soft. There is no mass.     Tenderness: There is no abdominal tenderness.     Hernia: No hernia is present.  Genitourinary:    Comments: Breast exam: No mass, nodules, thickening, tenderness, bulging, retraction, inflamation, nipple discharge or skin changes noted.  No axillary or clavicular LA.     Musculoskeletal:        General: No tenderness. Normal range of motion.     Cervical back: Normal range of motion and neck supple. No rigidity. No muscular tenderness.     Right lower leg: No edema.     Left lower leg: No edema.     Comments: No kyphosis   Lymphadenopathy:     Cervical: No cervical adenopathy.  Skin:    General: Skin is warm and dry.     Coloration: Skin is not pale.     Findings: No erythema or rash.     Comments: Solar lentigines diffusely   Neurological:     Mental Status: She is alert. Mental status is at baseline.     Cranial Nerves: No cranial nerve deficit.     Motor: No abnormal muscle tone.     Coordination: Coordination normal.     Gait: Gait normal.     Deep Tendon Reflexes: Reflexes are normal and symmetric.  Reflexes normal.  Psychiatric:        Mood and Affect: Mood normal.        Cognition and Memory: Cognition and memory normal.          Assessment & Plan:   Problem List Items Addressed This Visit       Cardiovascular and Mediastinum   Essential hypertension    bp in fair control at this time  BP Readings from Last 1 Encounters:  03/02/21 131/77  No changes needed Most recent labs reviewed  Disc lifstyle change with low sodium diet and exercise  Plan to continue miacardis hct 160-25 mg daily and hctz 25 mg daily additionally       Relevant Medications   telmisartan-hydrochlorothiazide (MICARDIS HCT) 80-12.5 MG tablet   simvastatin (ZOCOR) 20 MG tablet   hydrochlorothiazide (HYDRODIURIL) 25 MG tablet     Endocrine  Subclinical hypothyroidism    Lab Results  Component Value Date   TSH 4.65 (H) 02/23/2021    This is new  No clinical changes  Will re check in 1 mo with FT4       Relevant Orders   TSH   T4, free     Musculoskeletal and Integument   Osteopenia    dexa 5/21 Tried alendronate and had side effects  No falls or fractures Taking D and ca  Good exercise   Disc need for calcium/ vitamin D/ wt bearing exercise and bone density test every 2 y to monitor Disc safety/ fracture risk in detail           Other   HYPERCHOLESTEROLEMIA, PURE    Disc goals for lipids and reasons to control them Rev last labs with pt Rev low sat fat diet in detail Plan to continue simvastatin and diet         Relevant Medications   telmisartan-hydrochlorothiazide (MICARDIS HCT) 80-12.5 MG tablet   simvastatin (ZOCOR) 20 MG tablet   hydrochlorothiazide (HYDRODIURIL) 25 MG tablet   Gout    Lab Results  Component Value Date   LABURIC 5.0 02/23/2021  Plans to continue allopurinol  No recent flares        Encounter for Medicare annual wellness exam - Primary    Reviewed health habits including diet and exercise and skin cancer prevention Reviewed appropriate  screening tests for age  Also reviewed health mt list, fam hx and immunization status , as well as social and family history   See HPI Labs reviewed  Declines colon cancer screening  Mammogram ordered- pt will schedule Recommend shingrix vaccine if affordable  No falls or fx/ dexa is utd and good exercise Advance directive is up to date  No cognitive concerns (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)  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 and has been evaluated Due for eye prof exam, vision is 20/30 with both and she thinks she needs new glasses         Colon cancer screening    Pt declines any kind of colon cancer screening        Prediabetes    Lab Results  Component Value Date   HGBA1C 6.2 02/23/2021  This is up  disc imp of low glycemic diet and wt loss to prevent DM2         Serum calcium elevated    Mildly elevated at 10.7 PTH nl last few times Continue to monitor  Has osteopenia         Relevant Orders   PTH, Intact and Calcium   Encounter for screening mammogram for breast cancer    Scheduled annual screening mammogram Nl breast exam today  Encouraged monthly self exams         Relevant Orders   MM 3D SCREEN BREAST BILATERAL

## 2021-03-02 NOTE — Assessment & Plan Note (Signed)
Lab Results  Component Value Date   HGBA1C 6.2 02/23/2021   This is up  disc imp of low glycemic diet and wt loss to prevent DM2

## 2021-03-02 NOTE — Assessment & Plan Note (Signed)
Mildly elevated at 10.7 PTH nl last few times Continue to monitor  Has osteopenia

## 2021-03-02 NOTE — Patient Instructions (Addendum)
Don't forget to schedule your mammogram   If you are interested in the new shingles vaccine (Shingrix) - call your local pharmacy to check on coverage and availability  If affordable, get on a wait list at your pharmacy to get the vaccine.  For diabetes prevention  Try to get most of your carbohydrates from produce (with the exception of white potatoes)  Eat less bread/pasta/rice/snack foods/cereals/sweets and other items from the middle of the grocery store (processed carbs)   Let's re check thyroid tests in 1-2 months

## 2021-03-02 NOTE — Assessment & Plan Note (Addendum)
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

## 2021-03-02 NOTE — Assessment & Plan Note (Signed)
Scheduled annual screening mammogram Nl breast exam today  Encouraged monthly self exams   

## 2021-03-02 NOTE — Assessment & Plan Note (Signed)
bp in fair control at this time  BP Readings from Last 1 Encounters:  03/02/21 131/77   No changes needed Most recent labs reviewed  Disc lifstyle change with low sodium diet and exercise  Plan to continue miacardis hct 160-25 mg daily and hctz 25 mg daily additionally

## 2021-03-11 ENCOUNTER — Other Ambulatory Visit: Payer: Self-pay

## 2021-03-11 ENCOUNTER — Ambulatory Visit
Admission: RE | Admit: 2021-03-11 | Discharge: 2021-03-11 | Disposition: A | Discharge status: Home / Self Care | Payer: Medicare Other | Source: Ambulatory Visit | Attending: Family Medicine | Admitting: Family Medicine

## 2021-03-11 DIAGNOSIS — Z1231 Encounter for screening mammogram for malignant neoplasm of breast: Secondary | ICD-10-CM | POA: Diagnosis present

## 2021-03-25 ENCOUNTER — Encounter: Payer: Self-pay | Admitting: Dermatology

## 2021-03-25 ENCOUNTER — Ambulatory Visit (INDEPENDENT_AMBULATORY_CARE_PROVIDER_SITE_OTHER): Payer: Medicare Other | Admitting: Dermatology

## 2021-03-25 ENCOUNTER — Other Ambulatory Visit: Payer: Self-pay

## 2021-03-25 DIAGNOSIS — I8393 Asymptomatic varicose veins of bilateral lower extremities: Secondary | ICD-10-CM | POA: Diagnosis not present

## 2021-03-25 DIAGNOSIS — L820 Inflamed seborrheic keratosis: Secondary | ICD-10-CM

## 2021-03-25 DIAGNOSIS — Z85820 Personal history of malignant melanoma of skin: Secondary | ICD-10-CM

## 2021-03-25 NOTE — Progress Notes (Signed)
   New Patient Visit  Subjective  Andrea Peters is a 75 y.o. female who presents for the following: New Patient (Initial Visit) (Patient here today concerning a spot on right forearm and back of right leg. Patient reports the spot on right forearm has been present for months. Patient reports she noticed the spot at back of right leg a couple months ago and would like to have checked. Patient reports history of melanoma in 2013. She also has history of dysplastic nevus. She reports she is currently followed by Dr. Freddi Che surgical oncologist at [facility] about every 6 months for her melanoma.  She states she has a total-body skin exam with her visits at [facility]  The following portions of the chart were reviewed this encounter and updated as appropriate:   Tobacco  Allergies  Meds  Problems  Med Hx  Surg Hx  Fam Hx     Review of Systems:  No other skin or systemic complaints except as noted in HPI or Assessment and Plan.  Objective  Well appearing patient in no apparent distress; mood and affect are within normal limits.  A focused examination was performed including right forearm, bilateral legs, and right calf. Relevant physical exam findings are noted in the Assessment and Plan.  right forearm x 1, right calf x 1 (2) Erythematous keratotic or waxy stuck-on papule or plaque.   bilateral extremities    Assessment & Plan  Inflamed seborrheic keratosis right forearm x 1, right calf x 1  Prior to procedure, discussed risks of blister formation, small wound, skin dyspigmentation, or rare scar following cryotherapy. Recommend Vaseline ointment to treated areas while healing.   Destruction of lesion - right forearm x 1, right calf x 1 Complexity: simple   Destruction method: cryotherapy   Informed consent: discussed and consent obtained   Timeout:  patient name, date of birth, surgical site, and procedure verified Lesion destroyed using liquid nitrogen: Yes   Region frozen until ice  ball extended beyond lesion: Yes   Outcome: patient tolerated procedure well with no complications   Post-procedure details: wound care instructions given    Spider veins of both lower extremities bilateral extremities  Discussed sclerotherapy treatment   Discussed $350 session  Discussed best time to treat in fall / winter   History of Melanoma Clear (2014)- followed by Dr. Freddi Che at surgical oncology at [facility] - No evidence of recurrence today - No lymphadenopathy - Recommend regular full body skin exams (she states she is getting these total-body skin exams at [facility] with her visits there.) - Recommend daily broad spectrum sunscreen SPF 30+ to sun-exposed areas, reapply every 2 hours as needed.  - Call if any new or changing lesions are noted between office visits  Return if symptoms worsen or fail to improve.  IRuthell Rummage, CMA, am acting as scribe for Sarina Ser, MD. Documentation: I have reviewed the above documentation for accuracy and completeness, and I agree with the above.  Sarina Ser, MD

## 2021-03-25 NOTE — Patient Instructions (Signed)
Cryotherapy Aftercare  Wash gently with soap and water everyday.   Apply Vaseline and Band-Aid daily until healed.   Seborrheic Keratosis  What causes seborrheic keratoses? Seborrheic keratoses are harmless, common skin growths that first appear during adult life.  As time goes by, more growths appear.  Some people may develop a large number of them.  Seborrheic keratoses appear on both covered and uncovered body parts.  They are not caused by sunlight.  The tendency to develop seborrheic keratoses can be inherited.  They vary in color from skin-colored to gray, brown, or even black.  They can be either smooth or have a rough, warty surface.   Seborrheic keratoses are superficial and look as if they were stuck on the skin.  Under the microscope this type of keratosis looks like layers upon layers of skin.  That is why at times the top layer may seem to fall off, but the rest of the growth remains and re-grows.    Treatment Seborrheic keratoses do not need to be treated, but can easily be removed in the office.  Seborrheic keratoses often cause symptoms when they rub on clothing or jewelry.  Lesions can be in the way of shaving.  If they become inflamed, they can cause itching, soreness, or burning.  Removal of a seborrheic keratosis can be accomplished by freezing, burning, or surgery. If any spot bleeds, scabs, or grows rapidly, please return to have it checked, as these can be an indication of a skin cancer.   If you have any questions or concerns for your doctor, please call our main line at [phone] and press option 4 to reach your doctor's medical assistant. If no one answers, please leave a voicemail as directed and we will return your call as soon as possible. Messages left after 4 pm will be answered the following business day.   You may also send [place] a message via [place]. We typically respond to MyChart messages within 1-2 business days.  For prescription refills, please ask your  pharmacy to contact our office. Our fax number is [phone].  If you have an urgent issue when the clinic is closed that cannot wait until the next business day, you can page your doctor at the number below.    Please note that while we do our best to be available for urgent issues outside of office hours, we are not available 24/7.   If you have an urgent issue and are unable to reach [place], you may choose to seek medical care at your doctor's office, retail clinic, urgent care center, or emergency room.  If you have a medical emergency, please immediately call 911 or go to the emergency department.  Pager Numbers  - Dr. Nehemiah Massed: [phone]  - Dr. Laurence Ferrari: [phone]  - Dr. Nicole Kindred: [phone]  In the event of inclement weather, please call our main line at [phone] for an update on the status of any delays or closures.  Dermatology Medication Tips: Please keep the boxes that topical medications come in in order to help keep track of the instructions about where and how to use these. Pharmacies typically print the medication instructions only on the boxes and not directly on the medication tubes.   If your medication is too expensive, please contact our office at [phone] option 4 or send [place] a message through [place].   We are unable to tell what your co-pay for medications will be in advance as this is different depending on your insurance coverage.  However, we may be able to find a substitute medication at lower cost or fill out paperwork to get insurance to cover a needed medication.   If a prior authorization is required to get your medication covered by your insurance company, please allow [place] 1-2 business days to complete this process.  Drug prices often vary depending on where the prescription is filled and some pharmacies may offer cheaper prices.  The website www.goodrx.com contains coupons for medications through different pharmacies. The prices here do not  account for what the cost may be with help from insurance (it may be cheaper with your insurance), but the website can give you the price if you did not use any insurance.  - You can print the associated coupon and take it with your prescription to the pharmacy.  - You may also stop by our office during regular business hours and pick up a GoodRx coupon card.  - If you need your prescription sent electronically to a different pharmacy, notify our office through [facility] or by phone at [phone] option 4.

## 2021-03-27 ENCOUNTER — Encounter: Payer: Self-pay | Admitting: Dermatology

## 2021-04-19 ENCOUNTER — Other Ambulatory Visit: Payer: Self-pay

## 2021-04-19 ENCOUNTER — Other Ambulatory Visit (INDEPENDENT_AMBULATORY_CARE_PROVIDER_SITE_OTHER): Payer: Medicare Other

## 2021-04-19 DIAGNOSIS — E038 Other specified hypothyroidism: Secondary | ICD-10-CM | POA: Diagnosis not present

## 2021-04-19 LAB — TSH: TSH: 5.09 u[IU]/mL (ref 0.35–5.50)

## 2021-04-19 LAB — T4, FREE: Free T4: 1 ng/dL (ref 0.60–1.60)

## 2021-04-20 LAB — PTH, INTACT AND CALCIUM
Calcium: 9.6 mg/dL (ref 8.6–10.4)
PTH: 51 pg/mL (ref 16–77)

## 2021-04-22 ENCOUNTER — Encounter: Payer: Self-pay | Admitting: *Deleted

## 2021-04-24 ENCOUNTER — Other Ambulatory Visit: Payer: Self-pay | Admitting: Family Medicine

## 2021-05-22 ENCOUNTER — Other Ambulatory Visit: Payer: Self-pay | Admitting: Family Medicine

## 2021-07-28 ENCOUNTER — Other Ambulatory Visit: Payer: Self-pay | Admitting: Family Medicine

## 2021-09-24 ENCOUNTER — Encounter: Payer: Self-pay | Admitting: Family

## 2021-09-24 ENCOUNTER — Ambulatory Visit
Admission: RE | Admit: 2021-09-24 | Discharge: 2021-09-24 | Disposition: A | Discharge status: Home / Self Care | Payer: Medicare Other | Source: Ambulatory Visit | Attending: Family | Admitting: Family

## 2021-09-24 ENCOUNTER — Other Ambulatory Visit: Payer: Self-pay

## 2021-09-24 ENCOUNTER — Ambulatory Visit (INDEPENDENT_AMBULATORY_CARE_PROVIDER_SITE_OTHER): Payer: Medicare Other | Admitting: Family

## 2021-09-24 ENCOUNTER — Telehealth: Payer: Self-pay

## 2021-09-24 VITALS — BP 136/60 | HR 62 | Temp 97.9°F | Ht 61.0 in | Wt 189.0 lb

## 2021-09-24 DIAGNOSIS — R1013 Epigastric pain: Secondary | ICD-10-CM

## 2021-09-24 DIAGNOSIS — R630 Anorexia: Secondary | ICD-10-CM

## 2021-09-24 DIAGNOSIS — R109 Unspecified abdominal pain: Secondary | ICD-10-CM | POA: Diagnosis not present

## 2021-09-24 DIAGNOSIS — R12 Heartburn: Secondary | ICD-10-CM | POA: Diagnosis not present

## 2021-09-24 DIAGNOSIS — E279 Disorder of adrenal gland, unspecified: Secondary | ICD-10-CM | POA: Diagnosis not present

## 2021-09-24 DIAGNOSIS — R1084 Generalized abdominal pain: Secondary | ICD-10-CM | POA: Diagnosis not present

## 2021-09-24 DIAGNOSIS — K8689 Other specified diseases of pancreas: Secondary | ICD-10-CM | POA: Diagnosis not present

## 2021-09-24 DIAGNOSIS — K769 Liver disease, unspecified: Secondary | ICD-10-CM | POA: Diagnosis not present

## 2021-09-24 LAB — CBC WITH DIFFERENTIAL/PLATELET
Basophils Absolute: 0.1 10*3/uL (ref 0.0–0.1)
Basophils Relative: 0.5 % (ref 0.0–3.0)
Eosinophils Absolute: 0.2 10*3/uL (ref 0.0–0.7)
Eosinophils Relative: 1.8 % (ref 0.0–5.0)
HCT: 40.9 % (ref 36.0–46.0)
Hemoglobin: 13.4 g/dL (ref 12.0–15.0)
Lymphocytes Relative: 12.3 % (ref 12.0–46.0)
Lymphs Abs: 1.4 10*3/uL (ref 0.7–4.0)
MCHC: 32.8 g/dL (ref 30.0–36.0)
MCV: 94.7 fl (ref 78.0–100.0)
Monocytes Absolute: 0.8 10*3/uL (ref 0.1–1.0)
Monocytes Relative: 7.2 % (ref 3.0–12.0)
Neutro Abs: 8.9 10*3/uL — ABNORMAL HIGH (ref 1.4–7.7)
Neutrophils Relative %: 78.2 % — ABNORMAL HIGH (ref 43.0–77.0)
Platelets: 304 10*3/uL (ref 150.0–400.0)
RBC: 4.32 Mil/uL (ref 3.87–5.11)
RDW: 13.4 % (ref 11.5–15.5)
WBC: 11.3 10*3/uL — ABNORMAL HIGH (ref 4.0–10.5)

## 2021-09-24 LAB — COMPREHENSIVE METABOLIC PANEL
ALT: 11 U/L (ref 0–35)
AST: 15 U/L (ref 0–37)
Albumin: 4.2 g/dL (ref 3.5–5.2)
Alkaline Phosphatase: 74 U/L (ref 39–117)
BUN: 25 mg/dL — ABNORMAL HIGH (ref 6–23)
CO2: 29 mEq/L (ref 19–32)
Calcium: 10.7 mg/dL — ABNORMAL HIGH (ref 8.4–10.5)
Chloride: 99 mEq/L (ref 96–112)
Creatinine, Ser: 1.07 mg/dL (ref 0.40–1.20)
GFR: 50.25 mL/min — ABNORMAL LOW (ref >60.00)
Glucose, Bld: 125 mg/dL — ABNORMAL HIGH (ref 70–99)
Potassium: 3.4 mEq/L — ABNORMAL LOW (ref 3.5–5.1)
Sodium: 136 mEq/L (ref 135–145)
Total Bilirubin: 0.8 mg/dL (ref 0.2–1.2)
Total Protein: 7 g/dL (ref 6.0–8.3)

## 2021-09-24 LAB — H. PYLORI ANTIBODY, IGG: H Pylori IgG: NEGATIVE

## 2021-09-24 LAB — LIPASE: Lipase: 48 U/L (ref 11.0–59.0)

## 2021-09-24 LAB — AMYLASE: Amylase: 18 U/L — ABNORMAL LOW (ref 27–131)

## 2021-09-24 MED ORDER — PANTOPRAZOLE SODIUM 40 MG PO TBEC: 40.0000 mg | DELAYED_RELEASE_TABLET | Freq: Two times a day (BID) | ORAL | 0 refills | Status: DC

## 2021-09-24 MED ORDER — IOHEXOL 300 MG/ML  SOLN
100.0000 mL | Freq: Once | INTRAMUSCULAR | Status: AC | PRN
  Administered 2021-09-24: 100 mL via INTRAVENOUS

## 2021-09-24 NOTE — Assessment & Plan Note (Signed)
trial of PPI Rx given for pantoprazole 40 mg twice daily to take for the next 14 days to see if any improvement.  Referral to GI at this time as well.

## 2021-09-24 NOTE — Assessment & Plan Note (Signed)
Stat CT abdomen pelvis.  Pending results urinalysis ordered as well.

## 2021-09-24 NOTE — Patient Instructions (Addendum)
CAT scan of the abdomen pelvis was ordered today stat, and you are to go to: [facility], arrive for appointment at 1230 pm today. Do not eat anything further, and do not drink anything (not even water). Go to the [facility] medical mall entrance.   As discussed in the visit if you have any worsening abdominal pain and or fever chills chest pain shortness of breath or increasing weakness please immediately go to the emergency room and/or call 911.  Stop by the lab prior to leaving today. I will notify you of your results once received.   I will be starting you on a PPI that I want you to take daily for the next 30 days to see if we can have improvement in symptoms.  Pending the results of the lab work that I am ordering today as well as the CAT scan .Please keep me updated as well.  A referral was placed today for GI. Please let us know if you have not heard back within 1 week about your referral.  It was a pleasure seeing you today! Please do not hesitate to reach out with any questions and or concerns.  Regards,   Eugenia Pancoast FNP-C

## 2021-09-24 NOTE — Progress Notes (Signed)
Established Patient Office Visit  Subjective:  Patient ID: Andrea Peters, female    DOB: 06/20/1946  Age: 76 y.o. MRN: [id number]  CC:  Chief Complaint  Patient presents with   Gastroesophageal Reflux    HPI Andrea Peters is here today with concerns.   She notices that when she eats she has severe indigestion that starts around epigastric area and goes all the way around her rib cage. She no longer has her gallbladder. Noticed initially a little prior to christmas and would occur intermittently, but has since increased in frequency and now every time she eats, even water, she has the pain. She states since five days ago has improved slightly, but she has increasing weakness with bil leg heaviness. She states she has not been eating much throughout this one week.   With an empty stomach she does not have the pain, after eating only.  She denies n/v.  No constipation/diarrhea.  Increased flatulence. No blood in stool.   Past Medical History:  Diagnosis Date   Allergic rhinitis    CHOLELITHIASIS, HX OF 11/17/2010   Qualifier: Diagnosis of  By: Glori Bickers MD, Carmell Austria    Dysplastic nevus 02/06/2013   R flank - moderate   Hyperglycemia    Hyperlipidemia    Hypertension    Melanoma ([place]) 08/17/2012   back of right arm   Mixed incontinence    Obesity    Plantar fasciitis    with significant disability   Tendonitis of foot    L, chronic foot pain    Past Surgical History:  Procedure Laterality Date   ABDOMINAL HYSTERECTOMY     fibroids, ovaries intact   BREAST EXCISIONAL BIOPSY Bilateral 1960's -1970's   benign   EYE SURGERY Bilateral 12/2015   at [place]  5/07   Tendon rupture L foot    Family History  Problem Relation Age of Onset   Heart attack Mother    Other Mother        ?thyroid problems   Lung cancer Father     Social History   Socioeconomic History   Marital status: Married    Spouse name: Not on file   Number of children: Not on  file   Years of education: Not on file   Highest education level: Not on file  Occupational History   Not on file  Tobacco Use   Smoking status: Never   Smokeless tobacco: Never  Vaping Use   Vaping Use: Never used  Substance and Sexual Activity   Alcohol use: No    Alcohol/week: 0.0 standard drinks   Drug use: No   Sexual activity: Not on file  Other Topics Concern   Not on file  Social History Narrative   Cannot exercise to chronic foot pain   Social Determinants of Health   Financial Resource Strain: Not on file  Food Insecurity: Not on file  Transportation Needs: Not on file  Physical Activity: Not on file  Stress: Not on file  Social Connections: Not on file  Intimate Partner Violence: Not on file    Outpatient Medications Prior to Visit  Medication Sig Dispense Refill   albuterol (PROVENTIL HFA;VENTOLIN HFA) 108 (90 Base) MCG/ACT inhaler Inhale 2 puffs into the lungs every 4 (four) hours as needed for wheezing or shortness of breath (cough, shortness of breath or wheezing.). 1 Inhaler 1   allopurinol (ZYLOPRIM) 300 MG tablet TAKE 1 TABLET BY MOUTH EVERY DAY 90 tablet  1   aspirin 81 MG tablet Take 81 mg by mouth daily.       CALCIUM-VITAMIN D PO Take 1 tablet by mouth daily.       cetirizine (ZYRTEC) 10 MG tablet Take 1 tablet (10 mg total) by mouth daily. 30 tablet 3   fluticasone (FLONASE) 50 MCG/ACT nasal spray SPRAY 2 SPRAYS INTO EACH NOSTRIL EVERY DAY 48 mL 2   fluticasone furoate-vilanterol (BREO ELLIPTA) 200-25 MCG/INH AEPB USE 1 INHALATION ORALLY    DAILY 180 each 1   guaiFENesin (MUCINEX) 600 MG 12 hr tablet Take 1,200 mg by mouth daily.     hydrochlorothiazide (HYDRODIURIL) 25 MG tablet Take 1 tablet (25 mg total) by mouth daily. 90 tablet 3   meloxicam (MOBIC) 15 MG tablet Take 1 tablet (15 mg total) by mouth daily as needed for pain. With food 90 tablet 3   Multiple Vitamin (MULTIVITAMIN) tablet Take 2 tablets by mouth daily.      potassium chloride  (KLOR-CON) 10 MEQ tablet Take 1 tablet (10 mEq total) by mouth daily. 90 tablet 3   simvastatin (ZOCOR) 20 MG tablet TAKE 1 TABLET BY MOUTH EVERYDAY AT BEDTIME 90 tablet 3   telmisartan-hydrochlorothiazide (MICARDIS HCT) 80-12.5 MG tablet Take 2 tablets by mouth daily. 180 tablet 3   colchicine 0.6 MG tablet as needed. Take 2  tablets now with food and then 1 tablet twice a day with food     No facility-administered medications prior to visit.    Allergies  Allergen Reactions   Alendronate     Hair loss  Nausea Diarrhea    Lipitor [Atorvastatin]     Muscle pain   Naproxen Sodium    Bioflavonoids Rash   Naproxen Other (See Comments) and Rash    Severe indigestion    ROS Review of Systems  Constitutional:  Positive for fatigue. Negative for chills and fever.  Gastrointestinal:  Positive for abdominal pain. Negative for constipation, diarrhea, nausea and vomiting.       Increased flatulence and heartburn   Genitourinary:  Positive for flank pain. Negative for dysuria, urgency (bil) and vaginal pain.  Neurological:  Positive for weakness.     Objective:    Physical Exam Constitutional:      General: She is not in acute distress.    Appearance: Normal appearance. She is normal weight. She is not ill-appearing, toxic-appearing or diaphoretic.  HENT:     Head: Normocephalic.  Cardiovascular:     Rate and Rhythm: Normal rate and regular rhythm.     Pulses: Normal pulses.  Pulmonary:     Effort: Pulmonary effort is normal.     Breath sounds: Normal breath sounds.  Abdominal:     General: Abdomen is flat. Bowel sounds are normal.     Palpations: Abdomen is soft. There is no mass.     Tenderness: There is abdominal tenderness (epigastric (moderate), ruq (moderate), and rlq (mild)). There is guarding. There is no rebound.  Skin:    General: Skin is warm.  Neurological:     General: No focal deficit present.     Mental Status: She is alert and oriented to person, place, and  time.  Psychiatric:        Mood and Affect: Mood normal.        Behavior: Behavior normal.        Thought Content: Thought content normal.        Judgment: Judgment normal.    BP 136/60  Pulse 62    Temp 97.9 F (36.6 C) (Temporal)    Ht 5\' 1"  (1.549 m)    Wt 189 lb (85.7 kg)    SpO2 97%    BMI 35.71 kg/m  Wt Readings from Last 3 Encounters:  09/24/21 189 lb (85.7 kg)  03/02/21 194 lb (88 kg)  05/12/20 194 lb 4.8 oz (88.1 kg)     Health Maintenance Due  Topic Date Due   Zoster Vaccines- Shingrix (1 of 2) Never done   TETANUS/TDAP  01/29/2018   COVID-19 Vaccine (3 - Pfizer risk series) 12/24/2019   INFLUENZA VACCINE  04/05/2021    There are no preventive care reminders to display for this patient.  Lab Results  Component Value Date   TSH 5.09 04/19/2021   Lab Results  Component Value Date   WBC 7.4 02/23/2021   HGB 13.2 02/23/2021   HCT 40.4 02/23/2021   MCV 96.8 02/23/2021   PLT 260.0 02/23/2021   Lab Results  Component Value Date   NA 141 02/23/2021   K 4.4 02/23/2021   CO2 33 (H) 02/23/2021   GLUCOSE 101 (H) 02/23/2021   BUN 33 (H) 02/23/2021   CREATININE 1.07 02/23/2021   BILITOT 0.7 02/23/2021   ALKPHOS 68 02/23/2021   AST 13 02/23/2021   ALT 11 02/23/2021   PROT 6.5 02/23/2021   ALBUMIN 4.3 02/23/2021   CALCIUM 9.6 04/19/2021   GFR 50.45 (L) 02/23/2021   Lab Results  Component Value Date   HGBA1C 6.2 02/23/2021      Assessment & Plan:   Problem List Items Addressed This Visit       Other   Heart burn     trial of PPI Rx given for pantoprazole 40 mg twice daily to take for the next 14 days to see if any improvement.  Referral to GI at this time as well.      Relevant Medications   pantoprazole (PROTONIX) 40 MG tablet   Other Relevant Orders   H. pylori antibody, IgG   Alpha-Gal Panel   CT Abdomen Pelvis W Contrast   Ambulatory referral to Gastroenterology   Epigastric pain    Moderate epigastric pain and on physical examination  she is in the treatment trial PPI was 40 mg twice daily for 14 days as suspected ulcer possibly.  Stat CT abdomen and pelvis currently in progress to rule out other etiologies.      Relevant Medications   pantoprazole (PROTONIX) 40 MG tablet   Other Relevant Orders   H. pylori antibody, IgG   Alpha-Gal Panel   CT Abdomen Pelvis W Contrast   Ambulatory referral to Gastroenterology   Flank pain    Stat CT abdomen pelvis.  Pending results urinalysis ordered as well.      Relevant Orders   Urinalysis w microscopic + reflex cultur   Comprehensive metabolic panel   CT Abdomen Pelvis W Contrast   Decreased appetite - Primary    Patient has not been eating that often and weakness is thought to possibly be due to this advised patient to use tolerated increase water intake if possible and if continues to feel weak/worsening weakness to immediately go to the emergency room.      Relevant Orders   CT Abdomen Pelvis W Contrast   Ambulatory referral to Gastroenterology   Generalized abdominal pain    Moderate pain in epigastric and right upper quadrant no rebound or guarding  On physical exam, mild right lower quadrant abdominal  pain.  See stat CT of abdomen pelvis andProgress pending results.  Did advise patient if it there is any worsening of this pain and to immediately go to the emergency room and or call 911.  Lab work ordered as well for abdominal work-up.  Pending      Relevant Orders   Amylase   Lipase   CBC w/Diff   Comprehensive metabolic panel   CT Abdomen Pelvis W Contrast   Ambulatory referral to Gastroenterology    Meds ordered this encounter  Medications   pantoprazole (PROTONIX) 40 MG tablet    Sig: Take 1 tablet (40 mg total) by mouth 2 (two) times daily for 14 days.    Dispense:  28 tablet    Refill:  0    Order Specific Question:   Supervising Provider    Answer:   BEDSOLE, AMY E [[id number]]    Follow-up: Return if symptoms worsen or fail to improve.    Eugenia Pancoast, FNP

## 2021-09-24 NOTE — Assessment & Plan Note (Signed)
Moderate epigastric pain and on physical examination she is in the treatment trial PPI was 40 mg twice daily for 14 days as suspected ulcer possibly.  Stat CT abdomen and pelvis currently in progress to rule out other etiologies.

## 2021-09-24 NOTE — Telephone Encounter (Signed)
Olivia Mackie with [facility] radiology called report on CT abd,pelvis with contrast. Report in Epic. I spoke with T Dugal FNP and she will take care of. Sending note to Red Christians FNP and Dr Glori Bickers as Juluis Rainier for PCP.

## 2021-09-24 NOTE — Assessment & Plan Note (Signed)
Patient has not been eating that often and weakness is thought to possibly be due to this advised patient to use tolerated increase water intake if possible and if continues to feel weak/worsening weakness to immediately go to the emergency room.

## 2021-09-24 NOTE — Assessment & Plan Note (Addendum)
Moderate pain in epigastric and right upper quadrant no rebound or guarding  On physical exam, mild right lower quadrant abdominal pain.  See stat CT of abdomen pelvis andProgress pending results.  Did advise patient if it there is any worsening of this pain and to immediately go to the emergency room and or call 911.  Lab work ordered as well for abdominal work-up.  Pending

## 2021-09-26 ENCOUNTER — Other Ambulatory Visit: Payer: Self-pay | Admitting: Family

## 2021-09-26 DIAGNOSIS — D3501 Benign neoplasm of right adrenal gland: Secondary | ICD-10-CM

## 2021-09-26 DIAGNOSIS — K3189 Other diseases of stomach and duodenum: Secondary | ICD-10-CM

## 2021-09-26 DIAGNOSIS — R918 Other nonspecific abnormal finding of lung field: Secondary | ICD-10-CM

## 2021-09-26 DIAGNOSIS — K769 Liver disease, unspecified: Secondary | ICD-10-CM

## 2021-09-26 DIAGNOSIS — R590 Localized enlarged lymph nodes: Secondary | ICD-10-CM

## 2021-09-26 NOTE — Progress Notes (Signed)
Called pt Friday evening with results. Discussed with her in detail and advised her that I would be setting her up with an oncologist as soon as possible, stat referral as well as ordering a CT chest for additional view as suggested by the radiologist. Pt did state she was feeling a bit better and less weak then from this am, however I did let pt know that if she were to experience any worsening pain and or weakness over the weekend to go the ER as I am worried about dehydration with her having difficulty keeping food and water down.

## 2021-09-26 NOTE — Progress Notes (Signed)
Good morning Dr. Glori Bickers,   I just wanted you to be also be aware what is going on with your patient. Friday she came in with some abdominal pain, nausea, and difficulty keeping food and also water down. I sent her for a stat Ct abd/pelvis with attached findings. I called her late Friday evening to update her on what was seen, and I have placed both stat referrals for oncology as well as CT chest pending Monday.

## 2021-09-27 ENCOUNTER — Ambulatory Visit
Admission: RE | Admit: 2021-09-27 | Discharge: 2021-09-27 | Disposition: A | Discharge status: Home / Self Care | Payer: Medicare Other | Source: Ambulatory Visit | Attending: Family | Admitting: Family

## 2021-09-27 ENCOUNTER — Telehealth: Payer: Self-pay

## 2021-09-27 DIAGNOSIS — R918 Other nonspecific abnormal finding of lung field: Secondary | ICD-10-CM | POA: Diagnosis not present

## 2021-09-27 DIAGNOSIS — R911 Solitary pulmonary nodule: Secondary | ICD-10-CM | POA: Diagnosis not present

## 2021-09-27 DIAGNOSIS — K3189 Other diseases of stomach and duodenum: Secondary | ICD-10-CM | POA: Insufficient documentation

## 2021-09-27 DIAGNOSIS — R590 Localized enlarged lymph nodes: Secondary | ICD-10-CM | POA: Insufficient documentation

## 2021-09-27 DIAGNOSIS — K769 Liver disease, unspecified: Secondary | ICD-10-CM | POA: Insufficient documentation

## 2021-09-27 DIAGNOSIS — D3501 Benign neoplasm of right adrenal gland: Secondary | ICD-10-CM | POA: Insufficient documentation

## 2021-09-27 MED ORDER — IOHEXOL 300 MG/ML  SOLN
75.0000 mL | Freq: Once | INTRAMUSCULAR | Status: AC | PRN
  Administered 2021-09-27: 75 mL via INTRAVENOUS

## 2021-09-27 NOTE — Telephone Encounter (Signed)
I have researched this and found that another Referral Coordinator was working on this referral and she put it back in the middle of the process. When I picked it back up she had selected [facility] Rogue Bussing so I picked up the referral from there and finished out the process - contacting the cancer center to schedule. She must not have seen the referral comments. I did not think to check the referral details section because all of the referral information/location was already generated. I have corrected this and re-routed the referral to the correct location.   Sched Instruct: Andrea Peters, Dr. Andi Devon, surgical oncologist [place]. Patient preference has been with him prior as a patient. [phone], need stat scheduling as new findings of suspicion for malignancy from Ct abd/pelvis 09/24/21   I have contacted patient and apologized for the inconvenience and confusion.

## 2021-09-27 NOTE — Telephone Encounter (Signed)
Patient called states that she was called by Cancer center for appointment. She states that she wanted to be sent to [facility] for oncology. She has been seen there in the past. Let her know that she should be getting call from them soon if she does not let our office know.

## 2021-09-28 ENCOUNTER — Encounter: Payer: Self-pay | Admitting: Family

## 2021-09-28 NOTE — Telephone Encounter (Signed)
Pt called stating that you have the wrong fax #, the fax # is [phone]. Pt would like a call back [phone]. Please advise.

## 2021-09-28 NOTE — Telephone Encounter (Signed)
I verified the fax with the office. Since she has not been seen in several years her referral has to through the New Patient Coordinator (that's their correct fax)

## 2021-09-28 NOTE — Telephone Encounter (Signed)
Cathy from [facility] wanted to leave her number just in case any questions arise [phone]

## 2021-09-29 ENCOUNTER — Telehealth: Payer: Self-pay | Admitting: Family Medicine

## 2021-09-29 ENCOUNTER — Encounter: Payer: Self-pay | Admitting: Family

## 2021-09-29 DIAGNOSIS — R630 Anorexia: Secondary | ICD-10-CM

## 2021-09-29 DIAGNOSIS — K3189 Other diseases of stomach and duodenum: Secondary | ICD-10-CM

## 2021-09-29 DIAGNOSIS — R1084 Generalized abdominal pain: Secondary | ICD-10-CM

## 2021-09-29 DIAGNOSIS — K769 Liver disease, unspecified: Secondary | ICD-10-CM

## 2021-09-29 DIAGNOSIS — R12 Heartburn: Secondary | ICD-10-CM

## 2021-09-29 DIAGNOSIS — R1013 Epigastric pain: Secondary | ICD-10-CM

## 2021-09-29 LAB — ALPHA-GAL PANEL
Beef IgE: <0.1 kU/L (ref <0.35)
Class: 0
Class: 0
Class: 0
Galactose-alpha-1,3-galactose IgE: <0.1 kU/L (ref <0.10)
LAMB/MUTTON IGE: <0.1 kU/L (ref <0.35)
Pork IgE: <0.1 kU/L (ref <0.35)

## 2021-09-29 NOTE — Telephone Encounter (Signed)
Pt son called stating that they have been waiting on the referral letter to be faxed every since Friday. Pt husband is asking could they just come and pick up the referral letter at the office. Please advise.

## 2021-09-29 NOTE — Telephone Encounter (Signed)
addressed through separate phone note see other phone note

## 2021-09-29 NOTE — Telephone Encounter (Signed)
Andrea Peters with [facility] called checking on status of referral. Please advise.

## 2021-09-29 NOTE — Telephone Encounter (Signed)
Pt husband called stating that they have been waiting on the referral letter to be faxed every since Friday. Pt husband is asking could they just come and pick up the referral letter at the office. Please advise.

## 2021-09-29 NOTE — Telephone Encounter (Signed)
GI referral re done for [facility]  Phone # to fax noted

## 2021-09-29 NOTE — Telephone Encounter (Signed)
There is no referral letter, Lawerance Bach, NP placed a referral, will route to referral pool to f/u with pt regarding referral

## 2021-09-30 DIAGNOSIS — R590 Localized enlarged lymph nodes: Secondary | ICD-10-CM | POA: Diagnosis not present

## 2021-09-30 DIAGNOSIS — R911 Solitary pulmonary nodule: Secondary | ICD-10-CM | POA: Diagnosis not present

## 2021-09-30 NOTE — Telephone Encounter (Signed)
There is no referral letter that I am aware of, I honestly have no idea what he is referencing; there is only the referral that we send to their office. I am assuming that this is for the Oncology referral? There are two referrals in the system, one for GI and one for Oncology.   Per Tabitha the GI referral was cancelled - not needed.   I called [place], Per Levada Dy everything was received and nothing further needed from our office.  Patient is scheduled on 10/01/21 at 12pm.  LM for Rowe Pavy (referral coordinator??) to call me back to verify that everything has been handled and nothing further needed from our office.   Lawerance Bach is handling conversations with the Son about the referrals.

## 2021-09-30 NOTE — Telephone Encounter (Signed)
I called [facility] this morning, Per Levada Dy everything was received and nothing further needed from our office.  Patient is scheduled on 10/01/21 at 12pm.   LM for Rowe Pavy (referral coordinator??) to call me back to verify that everything has been handled and nothing further needed from our office. See TE

## 2021-09-30 NOTE — Telephone Encounter (Signed)
I called [place], Per Levada Dy everything was received and nothing further needed from our office.  Patient is scheduled on 10/01/21 at 12pm.   LM for Rowe Pavy (referral coordinator??) to call me back to verify that everything has been handled and nothing further needed from our office

## 2021-10-01 ENCOUNTER — Encounter: Payer: Self-pay | Admitting: Family Medicine

## 2021-10-01 ENCOUNTER — Telehealth: Payer: Self-pay | Admitting: Family Medicine

## 2021-10-01 DIAGNOSIS — Z6835 Body mass index (BMI) 35.0-35.9, adult: Secondary | ICD-10-CM | POA: Diagnosis not present

## 2021-10-01 DIAGNOSIS — R918 Other nonspecific abnormal finding of lung field: Secondary | ICD-10-CM | POA: Diagnosis not present

## 2021-10-01 MED ORDER — HYDROCODONE-ACETAMINOPHEN 5-325 MG PO TABS: 1.0000 | ORAL_TABLET | Freq: Four times a day (QID) | ORAL | 0 refills | Status: DC | PRN

## 2021-10-01 NOTE — Telephone Encounter (Signed)
Pt notified Rx sent to pharmacy, and notified of Dr. Marliss Coots comments. Pt will call and schedule virtual visit after she gets her biopsy and imaging done next week so she can discuss results with PCP, pt declined to schedule appt now.

## 2021-10-01 NOTE — Telephone Encounter (Signed)
Pt said she isn't sleeping due to pain, pt said if her pain is better controlled she thinks she will sleep better. Nothing new except what's CT results showed that is in her chart.   [facility]

## 2021-10-01 NOTE — Telephone Encounter (Signed)
Doe she primarily need something for sleep or for pain?   (Or for anxiety)?  Also if there is anything new I need to know    Please review her drug allergies and get back to me Thanks

## 2021-10-01 NOTE — Telephone Encounter (Signed)
Patient states she left her appointment at [facility].  She is a cancer patient and is not sleeping due to the pain.  Patient states that [place] doctors told her to call us to get a prescription  Patient states she isn't sure if she can go through the weekend  Informed patient she may need an appointment. Patient requested I send the message

## 2021-10-01 NOTE — Telephone Encounter (Signed)
I sent norco to her pharmacy for pain (it will also sedating and hopefully help sleep.   Please keep me posted.  I can only px 5 days at a time (by law) so she will let me know when she needs a refill  This can cause constipation so take a stool softener as needed / also miralax otc daily if needed also   If she is open to a virtual visit next week we could touch base also Thanks

## 2021-10-01 NOTE — Telephone Encounter (Signed)
Noted.   Thank you for he update!

## 2021-10-04 NOTE — Telephone Encounter (Signed)
error 

## 2021-10-06 DIAGNOSIS — R918 Other nonspecific abnormal finding of lung field: Secondary | ICD-10-CM | POA: Diagnosis not present

## 2021-10-06 DIAGNOSIS — R911 Solitary pulmonary nodule: Secondary | ICD-10-CM | POA: Diagnosis not present

## 2021-10-07 ENCOUNTER — Telehealth: Payer: Self-pay | Admitting: Family Medicine

## 2021-10-07 MED ORDER — HYDROCODONE-ACETAMINOPHEN 5-325 MG PO TABS: 1.0000 | ORAL_TABLET | Freq: Four times a day (QID) | ORAL | 0 refills | Status: DC | PRN

## 2021-10-07 NOTE — Telephone Encounter (Signed)
[facility]

## 2021-10-07 NOTE — Telephone Encounter (Signed)
Mr. Andrea Peters called in due to [facility] of [facility]. It needs to go to [facility]

## 2021-10-07 NOTE — Addendum Note (Signed)
Addended by: Loura Pardon A on: 10/07/2021 11:46 AM   Modules accepted: Orders

## 2021-10-07 NOTE — Telephone Encounter (Signed)
I sent it  Let me know if they do not have it either  Thanks  Please cancel first px that was sent to [facility]

## 2021-10-15 DIAGNOSIS — M79601 Pain in right arm: Secondary | ICD-10-CM | POA: Diagnosis not present

## 2021-10-15 DIAGNOSIS — R918 Other nonspecific abnormal finding of lung field: Secondary | ICD-10-CM | POA: Diagnosis not present

## 2021-10-15 DIAGNOSIS — J984 Other disorders of lung: Secondary | ICD-10-CM | POA: Diagnosis not present

## 2021-10-15 DIAGNOSIS — C439 Malignant melanoma of skin, unspecified: Secondary | ICD-10-CM | POA: Diagnosis not present

## 2021-10-15 DIAGNOSIS — C7802 Secondary malignant neoplasm of left lung: Secondary | ICD-10-CM | POA: Diagnosis not present

## 2021-10-15 DIAGNOSIS — J939 Pneumothorax, unspecified: Secondary | ICD-10-CM | POA: Diagnosis not present

## 2021-10-15 DIAGNOSIS — C801 Malignant (primary) neoplasm, unspecified: Secondary | ICD-10-CM | POA: Diagnosis not present

## 2021-10-15 DIAGNOSIS — C3432 Malignant neoplasm of lower lobe, left bronchus or lung: Secondary | ICD-10-CM | POA: Diagnosis not present

## 2021-10-15 DIAGNOSIS — I10 Essential (primary) hypertension: Secondary | ICD-10-CM | POA: Diagnosis not present

## 2021-10-15 DIAGNOSIS — R590 Localized enlarged lymph nodes: Secondary | ICD-10-CM | POA: Diagnosis not present

## 2021-10-16 ENCOUNTER — Other Ambulatory Visit: Payer: Self-pay | Admitting: Family Medicine

## 2021-10-19 ENCOUNTER — Telehealth: Payer: Self-pay | Admitting: Family Medicine

## 2021-10-19 MED ORDER — HYDROXYZINE HCL 25 MG PO TABS: 12.5000 mg | ORAL_TABLET | Freq: Every evening | ORAL | 0 refills | Status: DC | PRN

## 2021-10-19 NOTE — Telephone Encounter (Signed)
Pt husband called wanting to know if something could be called in to help with her to sleep at night she is only getting about 2-3 hours a night

## 2021-10-19 NOTE — Telephone Encounter (Signed)
I pended hydroxyzine to send to pharm of choice (unsure of which pharmacy)  Use with caution  It can sedate and inc risk of falls

## 2021-10-19 NOTE — Addendum Note (Signed)
Addended by: Loura Pardon A on: 10/19/2021 04:55 PM   Modules accepted: Orders

## 2021-10-19 NOTE — Addendum Note (Signed)
Addended by: Tammi Sou on: 10/19/2021 05:00 PM   Modules accepted: Orders

## 2021-10-19 NOTE — Telephone Encounter (Signed)
Pt notified of Dr. Marliss Coots comments and instructions and sedation caution. Rx sent to pharmacy.

## 2021-10-25 ENCOUNTER — Telehealth: Payer: Self-pay | Admitting: Family Medicine

## 2021-10-25 MED ORDER — HYDROCODONE-ACETAMINOPHEN 7.5-325 MG PO TABS: 1.0000 | ORAL_TABLET | Freq: Four times a day (QID) | ORAL | 0 refills | Status: DC | PRN

## 2021-10-25 NOTE — Addendum Note (Signed)
Addended by: Loura Pardon A on: 10/25/2021 05:14 PM   Modules accepted: Orders

## 2021-10-25 NOTE — Telephone Encounter (Addendum)
Spoke with pt's son had he has multiple issues with med.  Pt is only taking 1 tab of the norco every 6 hrs. He said if she takes the 2 tabs at the same time it upsets her stomach to much to take.  Also it's making her constipated so she is afraid to take more pills, so pt's son is asking if they can get a stronger med that way pt wouldn't have to take so many pills to help with pain.  Pt's son said the norco does help during the day but due to pt not sleeping at night and feeling "loopy" in the am due to atarax pt is usually in the most pain in the morning and that's when the norco doesn't help her pain. As the day go on pt can get by with the norco as prescribed to help with the pain.  Pt is taking the hydroxyzine at bedtime along with her norco as prescribe but it's not helping her sleep at all and it makes her really "loopy" in the morning so he doesn't know if there is anything else PCP can recommend pt to take to help with sleeping problems

## 2021-10-25 NOTE — Telephone Encounter (Signed)
See separate phone note regarding increasing med.   Name of Medication: norco 5-325 Name of Pharmacy: [facility] or Written Date and Quantity: 10/07/21 #40 tabs Last Office Visit and Type: GERD on 09/24/21 with Lawerance Bach, NP Next Office Visit and Type: CPE on 03/03/22 Last Controlled Substance Agreement Date:  Last UDS:

## 2021-10-25 NOTE — Telephone Encounter (Signed)
I sent in norco 7.5 (stronger)- use with caution   Please schedule a virtual f/u to discuss sleep when able and just to touch base  This is a much trickier problem    She has an appt with her oncologist tomorrow -have pt ask them as well for any suggestions   Thanks

## 2021-10-25 NOTE — Telephone Encounter (Signed)
°  Encourage patient to contact the pharmacy for refills or they can request refills through [place]:  Please schedule appointment if longer than 1 year  NEXT APPOINTMENT DATE:02/24/22  MEDICATION:HYDROcodone-acetaminophen ([place]) 5-325 MG tablet  Is the patient out of medication?   PHARMACY:[id number] - [place], [place]   Let patient know to contact pharmacy at the end of the day to make sure medication is ready.  Please notify patient to allow 48-72 hours to process  CLINICAL FILLS OUT ALL BELOW:   LAST REFILL:  QTY:  REFILL DATE:    OTHER COMMENTS:    Okay for refill?  Please advise

## 2021-10-25 NOTE — Telephone Encounter (Signed)
Please ask if she is already taking 2 at a time (just wanted to make sure)

## 2021-10-25 NOTE — Telephone Encounter (Signed)
Pt son called asking if pt can try a stronger dose of medication HYDROcodone-acetaminophen (NORCO) 5-325 MG tablet. Please advise.

## 2021-10-26 DIAGNOSIS — G893 Neoplasm related pain (acute) (chronic): Secondary | ICD-10-CM | POA: Diagnosis not present

## 2021-10-26 DIAGNOSIS — C439 Malignant melanoma of skin, unspecified: Secondary | ICD-10-CM | POA: Diagnosis not present

## 2021-10-26 DIAGNOSIS — R530 Neoplastic (malignant) related fatigue: Secondary | ICD-10-CM | POA: Diagnosis not present

## 2021-10-26 NOTE — Telephone Encounter (Signed)
Son notified of Dr. Marliss Coots comments. He wants to hold off on the virtual visit until after their meeting with oncology today incase they can help her

## 2021-10-28 DIAGNOSIS — C439 Malignant melanoma of skin, unspecified: Secondary | ICD-10-CM | POA: Diagnosis not present

## 2021-10-28 DIAGNOSIS — C4361 Malignant melanoma of right upper limb, including shoulder: Secondary | ICD-10-CM | POA: Diagnosis not present

## 2021-11-02 DIAGNOSIS — C4361 Malignant melanoma of right upper limb, including shoulder: Secondary | ICD-10-CM | POA: Diagnosis not present

## 2021-11-02 DIAGNOSIS — Z79899 Other long term (current) drug therapy: Secondary | ICD-10-CM | POA: Diagnosis not present

## 2021-11-02 DIAGNOSIS — G893 Neoplasm related pain (acute) (chronic): Secondary | ICD-10-CM | POA: Diagnosis not present

## 2021-11-02 DIAGNOSIS — K5903 Drug induced constipation: Secondary | ICD-10-CM | POA: Diagnosis not present

## 2021-11-02 DIAGNOSIS — R0602 Shortness of breath: Secondary | ICD-10-CM | POA: Diagnosis not present

## 2021-11-02 DIAGNOSIS — R519 Headache, unspecified: Secondary | ICD-10-CM | POA: Diagnosis not present

## 2021-11-02 DIAGNOSIS — E860 Dehydration: Secondary | ICD-10-CM | POA: Diagnosis not present

## 2021-11-02 DIAGNOSIS — Z5112 Encounter for antineoplastic immunotherapy: Secondary | ICD-10-CM | POA: Diagnosis not present

## 2021-11-05 ENCOUNTER — Encounter: Payer: Self-pay | Admitting: Family Medicine

## 2021-11-08 DIAGNOSIS — C786 Secondary malignant neoplasm of retroperitoneum and peritoneum: Secondary | ICD-10-CM | POA: Diagnosis not present

## 2021-11-08 DIAGNOSIS — Z20822 Contact with and (suspected) exposure to covid-19: Secondary | ICD-10-CM | POA: Diagnosis not present

## 2021-11-08 DIAGNOSIS — C7802 Secondary malignant neoplasm of left lung: Secondary | ICD-10-CM | POA: Diagnosis not present

## 2021-11-08 DIAGNOSIS — T451X5A Adverse effect of antineoplastic and immunosuppressive drugs, initial encounter: Secondary | ICD-10-CM | POA: Diagnosis not present

## 2021-11-08 DIAGNOSIS — T7840XA Allergy, unspecified, initial encounter: Secondary | ICD-10-CM | POA: Diagnosis not present

## 2021-11-08 DIAGNOSIS — C7800 Secondary malignant neoplasm of unspecified lung: Secondary | ICD-10-CM | POA: Diagnosis not present

## 2021-11-08 DIAGNOSIS — M8458XA Pathological fracture in neoplastic disease, other specified site, initial encounter for fracture: Secondary | ICD-10-CM | POA: Diagnosis not present

## 2021-11-08 DIAGNOSIS — Z85820 Personal history of malignant melanoma of skin: Secondary | ICD-10-CM | POA: Diagnosis not present

## 2021-11-08 DIAGNOSIS — C7889 Secondary malignant neoplasm of other digestive organs: Secondary | ICD-10-CM | POA: Diagnosis not present

## 2021-11-08 DIAGNOSIS — R112 Nausea with vomiting, unspecified: Secondary | ICD-10-CM | POA: Diagnosis not present

## 2021-11-08 DIAGNOSIS — T7840XD Allergy, unspecified, subsequent encounter: Secondary | ICD-10-CM | POA: Diagnosis not present

## 2021-11-08 DIAGNOSIS — Z9049 Acquired absence of other specified parts of digestive tract: Secondary | ICD-10-CM | POA: Diagnosis not present

## 2021-11-08 DIAGNOSIS — R634 Abnormal weight loss: Secondary | ICD-10-CM | POA: Diagnosis not present

## 2021-11-08 DIAGNOSIS — Z515 Encounter for palliative care: Secondary | ICD-10-CM | POA: Diagnosis not present

## 2021-11-08 DIAGNOSIS — R627 Adult failure to thrive: Secondary | ICD-10-CM | POA: Diagnosis not present

## 2021-11-08 DIAGNOSIS — Z6834 Body mass index (BMI) 34.0-34.9, adult: Secondary | ICD-10-CM | POA: Diagnosis not present

## 2021-11-08 DIAGNOSIS — T886XXA Anaphylactic reaction due to adverse effect of correct drug or medicament properly administered, initial encounter: Secondary | ICD-10-CM | POA: Diagnosis not present

## 2021-11-08 DIAGNOSIS — R1312 Dysphagia, oropharyngeal phase: Secondary | ICD-10-CM | POA: Diagnosis not present

## 2021-11-08 DIAGNOSIS — R109 Unspecified abdominal pain: Secondary | ICD-10-CM | POA: Diagnosis not present

## 2021-11-08 DIAGNOSIS — E860 Dehydration: Secondary | ICD-10-CM | POA: Diagnosis not present

## 2021-11-08 DIAGNOSIS — J45909 Unspecified asthma, uncomplicated: Secondary | ICD-10-CM | POA: Diagnosis not present

## 2021-11-08 DIAGNOSIS — G939 Disorder of brain, unspecified: Secondary | ICD-10-CM | POA: Diagnosis not present

## 2021-11-08 DIAGNOSIS — C439 Malignant melanoma of skin, unspecified: Secondary | ICD-10-CM | POA: Diagnosis not present

## 2021-11-08 DIAGNOSIS — R918 Other nonspecific abnormal finding of lung field: Secondary | ICD-10-CM | POA: Diagnosis not present

## 2021-11-08 DIAGNOSIS — E669 Obesity, unspecified: Secondary | ICD-10-CM | POA: Diagnosis not present

## 2021-11-08 DIAGNOSIS — E871 Hypo-osmolality and hyponatremia: Secondary | ICD-10-CM | POA: Diagnosis not present

## 2021-11-08 DIAGNOSIS — C7931 Secondary malignant neoplasm of brain: Secondary | ICD-10-CM | POA: Diagnosis not present

## 2021-11-08 DIAGNOSIS — C784 Secondary malignant neoplasm of small intestine: Secondary | ICD-10-CM | POA: Diagnosis not present

## 2021-11-08 DIAGNOSIS — K5900 Constipation, unspecified: Secondary | ICD-10-CM | POA: Diagnosis not present

## 2021-11-08 DIAGNOSIS — C7971 Secondary malignant neoplasm of right adrenal gland: Secondary | ICD-10-CM | POA: Diagnosis not present

## 2021-11-08 DIAGNOSIS — M5459 Other low back pain: Secondary | ICD-10-CM | POA: Diagnosis not present

## 2021-11-08 DIAGNOSIS — S32010A Wedge compression fracture of first lumbar vertebra, initial encounter for closed fracture: Secondary | ICD-10-CM | POA: Diagnosis not present

## 2021-11-08 DIAGNOSIS — G893 Neoplasm related pain (acute) (chronic): Secondary | ICD-10-CM | POA: Diagnosis not present

## 2021-11-08 DIAGNOSIS — R530 Neoplastic (malignant) related fatigue: Secondary | ICD-10-CM | POA: Diagnosis not present

## 2021-11-08 DIAGNOSIS — L270 Generalized skin eruption due to drugs and medicaments taken internally: Secondary | ICD-10-CM | POA: Diagnosis not present

## 2021-11-08 DIAGNOSIS — I10 Essential (primary) hypertension: Secondary | ICD-10-CM | POA: Diagnosis not present

## 2021-11-08 DIAGNOSIS — C787 Secondary malignant neoplasm of liver and intrahepatic bile duct: Secondary | ICD-10-CM | POA: Diagnosis not present

## 2021-11-08 DIAGNOSIS — C7951 Secondary malignant neoplasm of bone: Secondary | ICD-10-CM | POA: Diagnosis not present

## 2021-11-09 DIAGNOSIS — T7840XA Allergy, unspecified, initial encounter: Secondary | ICD-10-CM | POA: Diagnosis not present

## 2021-11-09 DIAGNOSIS — C439 Malignant melanoma of skin, unspecified: Secondary | ICD-10-CM | POA: Diagnosis not present

## 2021-11-09 DIAGNOSIS — R1312 Dysphagia, oropharyngeal phase: Secondary | ICD-10-CM | POA: Diagnosis not present

## 2021-11-09 DIAGNOSIS — S32010A Wedge compression fracture of first lumbar vertebra, initial encounter for closed fracture: Secondary | ICD-10-CM | POA: Diagnosis not present

## 2021-11-09 DIAGNOSIS — G893 Neoplasm related pain (acute) (chronic): Secondary | ICD-10-CM | POA: Diagnosis not present

## 2021-11-09 DIAGNOSIS — C787 Secondary malignant neoplasm of liver and intrahepatic bile duct: Secondary | ICD-10-CM | POA: Diagnosis not present

## 2021-11-09 DIAGNOSIS — R627 Adult failure to thrive: Secondary | ICD-10-CM | POA: Diagnosis not present

## 2021-11-09 DIAGNOSIS — C786 Secondary malignant neoplasm of retroperitoneum and peritoneum: Secondary | ICD-10-CM | POA: Diagnosis not present

## 2021-11-09 DIAGNOSIS — Z515 Encounter for palliative care: Secondary | ICD-10-CM | POA: Diagnosis not present

## 2021-11-09 DIAGNOSIS — T7840XD Allergy, unspecified, subsequent encounter: Secondary | ICD-10-CM | POA: Diagnosis not present

## 2021-11-09 DIAGNOSIS — C7800 Secondary malignant neoplasm of unspecified lung: Secondary | ICD-10-CM | POA: Diagnosis not present

## 2021-11-10 ENCOUNTER — Other Ambulatory Visit: Payer: Self-pay | Admitting: Family Medicine

## 2021-11-10 DIAGNOSIS — R627 Adult failure to thrive: Secondary | ICD-10-CM | POA: Diagnosis not present

## 2021-11-10 DIAGNOSIS — T7840XA Allergy, unspecified, initial encounter: Secondary | ICD-10-CM | POA: Diagnosis not present

## 2021-11-10 DIAGNOSIS — S32010A Wedge compression fracture of first lumbar vertebra, initial encounter for closed fracture: Secondary | ICD-10-CM | POA: Diagnosis not present

## 2021-11-10 DIAGNOSIS — T7840XD Allergy, unspecified, subsequent encounter: Secondary | ICD-10-CM | POA: Diagnosis not present

## 2021-11-10 DIAGNOSIS — C439 Malignant melanoma of skin, unspecified: Secondary | ICD-10-CM | POA: Diagnosis not present

## 2021-11-10 DIAGNOSIS — Z515 Encounter for palliative care: Secondary | ICD-10-CM | POA: Diagnosis not present

## 2021-11-10 DIAGNOSIS — R1312 Dysphagia, oropharyngeal phase: Secondary | ICD-10-CM | POA: Diagnosis not present

## 2021-11-10 DIAGNOSIS — G893 Neoplasm related pain (acute) (chronic): Secondary | ICD-10-CM | POA: Diagnosis not present

## 2021-11-10 NOTE — Telephone Encounter (Signed)
CPE is for 03/03/22, last filled on 10/19/21 #30 tabs 0 refills. See comments on Rx, pt is asking for 9 day Rx ?

## 2021-11-11 DIAGNOSIS — C439 Malignant melanoma of skin, unspecified: Secondary | ICD-10-CM | POA: Diagnosis not present

## 2021-11-11 DIAGNOSIS — S32010A Wedge compression fracture of first lumbar vertebra, initial encounter for closed fracture: Secondary | ICD-10-CM | POA: Diagnosis not present

## 2021-11-11 DIAGNOSIS — R627 Adult failure to thrive: Secondary | ICD-10-CM | POA: Diagnosis not present

## 2021-11-11 DIAGNOSIS — T7840XD Allergy, unspecified, subsequent encounter: Secondary | ICD-10-CM | POA: Diagnosis not present

## 2021-11-11 DIAGNOSIS — T7840XA Allergy, unspecified, initial encounter: Secondary | ICD-10-CM | POA: Diagnosis not present

## 2021-11-11 DIAGNOSIS — Z515 Encounter for palliative care: Secondary | ICD-10-CM | POA: Diagnosis not present

## 2021-11-11 DIAGNOSIS — G893 Neoplasm related pain (acute) (chronic): Secondary | ICD-10-CM | POA: Diagnosis not present

## 2021-11-11 DIAGNOSIS — R1312 Dysphagia, oropharyngeal phase: Secondary | ICD-10-CM | POA: Diagnosis not present

## 2021-11-12 ENCOUNTER — Telehealth: Payer: Self-pay | Admitting: Family Medicine

## 2021-11-12 DIAGNOSIS — E871 Hypo-osmolality and hyponatremia: Secondary | ICD-10-CM | POA: Diagnosis not present

## 2021-11-12 DIAGNOSIS — Z7951 Long term (current) use of inhaled steroids: Secondary | ICD-10-CM | POA: Diagnosis not present

## 2021-11-12 DIAGNOSIS — C7889 Secondary malignant neoplasm of other digestive organs: Secondary | ICD-10-CM | POA: Diagnosis not present

## 2021-11-12 DIAGNOSIS — C711 Malignant neoplasm of frontal lobe: Secondary | ICD-10-CM | POA: Diagnosis not present

## 2021-11-12 DIAGNOSIS — Z6833 Body mass index (BMI) 33.0-33.9, adult: Secondary | ICD-10-CM | POA: Diagnosis not present

## 2021-11-12 DIAGNOSIS — I10 Essential (primary) hypertension: Secondary | ICD-10-CM | POA: Diagnosis not present

## 2021-11-12 DIAGNOSIS — R627 Adult failure to thrive: Secondary | ICD-10-CM | POA: Diagnosis not present

## 2021-11-12 DIAGNOSIS — E669 Obesity, unspecified: Secondary | ICD-10-CM | POA: Diagnosis not present

## 2021-11-12 DIAGNOSIS — N3946 Mixed incontinence: Secondary | ICD-10-CM | POA: Diagnosis not present

## 2021-11-12 DIAGNOSIS — M109 Gout, unspecified: Secondary | ICD-10-CM | POA: Diagnosis not present

## 2021-11-12 DIAGNOSIS — S32019D Unspecified fracture of first lumbar vertebra, subsequent encounter for fracture with routine healing: Secondary | ICD-10-CM | POA: Diagnosis not present

## 2021-11-12 DIAGNOSIS — G893 Neoplasm related pain (acute) (chronic): Secondary | ICD-10-CM | POA: Diagnosis not present

## 2021-11-12 DIAGNOSIS — E860 Dehydration: Secondary | ICD-10-CM | POA: Diagnosis not present

## 2021-11-12 DIAGNOSIS — J45909 Unspecified asthma, uncomplicated: Secondary | ICD-10-CM | POA: Diagnosis not present

## 2021-11-12 DIAGNOSIS — C787 Secondary malignant neoplasm of liver and intrahepatic bile duct: Secondary | ICD-10-CM | POA: Diagnosis not present

## 2021-11-12 DIAGNOSIS — C7970 Secondary malignant neoplasm of unspecified adrenal gland: Secondary | ICD-10-CM | POA: Diagnosis not present

## 2021-11-12 DIAGNOSIS — Z7952 Long term (current) use of systemic steroids: Secondary | ICD-10-CM | POA: Diagnosis not present

## 2021-11-12 DIAGNOSIS — Z7982 Long term (current) use of aspirin: Secondary | ICD-10-CM | POA: Diagnosis not present

## 2021-11-12 DIAGNOSIS — K219 Gastro-esophageal reflux disease without esophagitis: Secondary | ICD-10-CM | POA: Diagnosis not present

## 2021-11-12 DIAGNOSIS — Z9981 Dependence on supplemental oxygen: Secondary | ICD-10-CM | POA: Diagnosis not present

## 2021-11-12 DIAGNOSIS — C784 Secondary malignant neoplasm of small intestine: Secondary | ICD-10-CM | POA: Diagnosis not present

## 2021-11-12 DIAGNOSIS — E785 Hyperlipidemia, unspecified: Secondary | ICD-10-CM | POA: Diagnosis not present

## 2021-11-12 DIAGNOSIS — C7802 Secondary malignant neoplasm of left lung: Secondary | ICD-10-CM | POA: Diagnosis not present

## 2021-11-12 DIAGNOSIS — C7801 Secondary malignant neoplasm of right lung: Secondary | ICD-10-CM | POA: Diagnosis not present

## 2021-11-12 DIAGNOSIS — K5900 Constipation, unspecified: Secondary | ICD-10-CM | POA: Diagnosis not present

## 2021-11-12 NOTE — Telephone Encounter (Signed)
Left verbal orders on VM. 

## 2021-11-12 NOTE — Telephone Encounter (Signed)
Please ok those verbal orders  

## 2021-11-12 NOTE — Telephone Encounter (Signed)
Home Health verbal orders ?Caller Name:Kelly  ?Agency Name: [facility] ?Callback number: [phone] ? ?Requesting OT/PT/Skilled nursing/Social Work/Speech: ? ?Reason:PT ? ?Frequency: 2 w 4  1w 4 ?  ? ?Please forward to [facility] pool or providers CMA ? ?

## 2021-11-14 ENCOUNTER — Encounter: Payer: Self-pay | Admitting: Family Medicine

## 2021-11-15 DIAGNOSIS — C7801 Secondary malignant neoplasm of right lung: Secondary | ICD-10-CM | POA: Diagnosis not present

## 2021-11-15 DIAGNOSIS — C784 Secondary malignant neoplasm of small intestine: Secondary | ICD-10-CM | POA: Diagnosis not present

## 2021-11-15 DIAGNOSIS — C711 Malignant neoplasm of frontal lobe: Secondary | ICD-10-CM | POA: Diagnosis not present

## 2021-11-15 DIAGNOSIS — C7889 Secondary malignant neoplasm of other digestive organs: Secondary | ICD-10-CM | POA: Diagnosis not present

## 2021-11-15 DIAGNOSIS — C7802 Secondary malignant neoplasm of left lung: Secondary | ICD-10-CM | POA: Diagnosis not present

## 2021-11-15 DIAGNOSIS — C787 Secondary malignant neoplasm of liver and intrahepatic bile duct: Secondary | ICD-10-CM | POA: Diagnosis not present

## 2021-11-17 DIAGNOSIS — C7889 Secondary malignant neoplasm of other digestive organs: Secondary | ICD-10-CM | POA: Diagnosis not present

## 2021-11-17 DIAGNOSIS — C784 Secondary malignant neoplasm of small intestine: Secondary | ICD-10-CM | POA: Diagnosis not present

## 2021-11-17 DIAGNOSIS — C787 Secondary malignant neoplasm of liver and intrahepatic bile duct: Secondary | ICD-10-CM | POA: Diagnosis not present

## 2021-11-17 DIAGNOSIS — C7801 Secondary malignant neoplasm of right lung: Secondary | ICD-10-CM | POA: Diagnosis not present

## 2021-11-17 DIAGNOSIS — C711 Malignant neoplasm of frontal lobe: Secondary | ICD-10-CM | POA: Diagnosis not present

## 2021-11-17 DIAGNOSIS — C7802 Secondary malignant neoplasm of left lung: Secondary | ICD-10-CM | POA: Diagnosis not present

## 2021-11-22 DIAGNOSIS — C7889 Secondary malignant neoplasm of other digestive organs: Secondary | ICD-10-CM | POA: Diagnosis not present

## 2021-11-22 DIAGNOSIS — C711 Malignant neoplasm of frontal lobe: Secondary | ICD-10-CM | POA: Diagnosis not present

## 2021-11-22 DIAGNOSIS — C7802 Secondary malignant neoplasm of left lung: Secondary | ICD-10-CM | POA: Diagnosis not present

## 2021-11-22 DIAGNOSIS — C787 Secondary malignant neoplasm of liver and intrahepatic bile duct: Secondary | ICD-10-CM | POA: Diagnosis not present

## 2021-11-22 DIAGNOSIS — C7801 Secondary malignant neoplasm of right lung: Secondary | ICD-10-CM | POA: Diagnosis not present

## 2021-11-22 DIAGNOSIS — C784 Secondary malignant neoplasm of small intestine: Secondary | ICD-10-CM | POA: Diagnosis not present

## 2021-11-23 DIAGNOSIS — C439 Malignant melanoma of skin, unspecified: Secondary | ICD-10-CM | POA: Diagnosis not present

## 2021-11-23 DIAGNOSIS — E860 Dehydration: Secondary | ICD-10-CM | POA: Diagnosis not present

## 2021-11-23 DIAGNOSIS — L299 Pruritus, unspecified: Secondary | ICD-10-CM | POA: Diagnosis not present

## 2021-11-23 DIAGNOSIS — G893 Neoplasm related pain (acute) (chronic): Secondary | ICD-10-CM | POA: Diagnosis not present

## 2021-11-24 DIAGNOSIS — Z5112 Encounter for antineoplastic immunotherapy: Secondary | ICD-10-CM | POA: Diagnosis not present

## 2021-11-24 DIAGNOSIS — E860 Dehydration: Secondary | ICD-10-CM | POA: Diagnosis not present

## 2021-11-24 DIAGNOSIS — C439 Malignant melanoma of skin, unspecified: Secondary | ICD-10-CM | POA: Diagnosis not present

## 2021-11-24 NOTE — Telephone Encounter (Signed)
error 

## 2021-11-25 DIAGNOSIS — C7802 Secondary malignant neoplasm of left lung: Secondary | ICD-10-CM | POA: Diagnosis not present

## 2021-11-25 DIAGNOSIS — C4361 Malignant melanoma of right upper limb, including shoulder: Secondary | ICD-10-CM | POA: Diagnosis not present

## 2021-11-25 DIAGNOSIS — C784 Secondary malignant neoplasm of small intestine: Secondary | ICD-10-CM | POA: Diagnosis not present

## 2021-11-25 DIAGNOSIS — C7889 Secondary malignant neoplasm of other digestive organs: Secondary | ICD-10-CM | POA: Diagnosis not present

## 2021-11-25 DIAGNOSIS — C787 Secondary malignant neoplasm of liver and intrahepatic bile duct: Secondary | ICD-10-CM | POA: Diagnosis not present

## 2021-11-25 DIAGNOSIS — C711 Malignant neoplasm of frontal lobe: Secondary | ICD-10-CM | POA: Diagnosis not present

## 2021-11-25 DIAGNOSIS — C7801 Secondary malignant neoplasm of right lung: Secondary | ICD-10-CM | POA: Diagnosis not present

## 2021-11-29 DIAGNOSIS — C787 Secondary malignant neoplasm of liver and intrahepatic bile duct: Secondary | ICD-10-CM | POA: Diagnosis present

## 2021-11-29 DIAGNOSIS — T783XXA Angioneurotic edema, initial encounter: Secondary | ICD-10-CM | POA: Diagnosis present

## 2021-11-29 DIAGNOSIS — R0902 Hypoxemia: Secondary | ICD-10-CM | POA: Diagnosis present

## 2021-11-29 DIAGNOSIS — C7889 Secondary malignant neoplasm of other digestive organs: Secondary | ICD-10-CM | POA: Diagnosis present

## 2021-11-29 DIAGNOSIS — T451X5A Adverse effect of antineoplastic and immunosuppressive drugs, initial encounter: Secondary | ICD-10-CM | POA: Diagnosis present

## 2021-11-29 DIAGNOSIS — C7971 Secondary malignant neoplasm of right adrenal gland: Secondary | ICD-10-CM | POA: Diagnosis not present

## 2021-11-29 DIAGNOSIS — I10 Essential (primary) hypertension: Secondary | ICD-10-CM | POA: Diagnosis present

## 2021-11-29 DIAGNOSIS — J45909 Unspecified asthma, uncomplicated: Secondary | ICD-10-CM | POA: Diagnosis present

## 2021-11-29 DIAGNOSIS — G893 Neoplasm related pain (acute) (chronic): Secondary | ICD-10-CM | POA: Diagnosis not present

## 2021-11-29 DIAGNOSIS — R220 Localized swelling, mass and lump, head: Secondary | ICD-10-CM | POA: Diagnosis not present

## 2021-11-29 DIAGNOSIS — R634 Abnormal weight loss: Secondary | ICD-10-CM | POA: Diagnosis present

## 2021-11-29 DIAGNOSIS — R110 Nausea: Secondary | ICD-10-CM | POA: Diagnosis not present

## 2021-11-29 DIAGNOSIS — R112 Nausea with vomiting, unspecified: Secondary | ICD-10-CM | POA: Diagnosis present

## 2021-11-29 DIAGNOSIS — Z20822 Contact with and (suspected) exposure to covid-19: Secondary | ICD-10-CM | POA: Diagnosis present

## 2021-11-29 DIAGNOSIS — L270 Generalized skin eruption due to drugs and medicaments taken internally: Secondary | ICD-10-CM | POA: Diagnosis present

## 2021-11-29 DIAGNOSIS — R530 Neoplastic (malignant) related fatigue: Secondary | ICD-10-CM | POA: Diagnosis present

## 2021-11-29 DIAGNOSIS — H43393 Other vitreous opacities, bilateral: Secondary | ICD-10-CM | POA: Diagnosis not present

## 2021-11-29 DIAGNOSIS — T7840XD Allergy, unspecified, subsequent encounter: Secondary | ICD-10-CM | POA: Diagnosis not present

## 2021-11-29 DIAGNOSIS — E871 Hypo-osmolality and hyponatremia: Secondary | ICD-10-CM | POA: Diagnosis present

## 2021-11-29 DIAGNOSIS — M8458XA Pathological fracture in neoplastic disease, other specified site, initial encounter for fracture: Secondary | ICD-10-CM | POA: Diagnosis present

## 2021-11-29 DIAGNOSIS — N390 Urinary tract infection, site not specified: Secondary | ICD-10-CM | POA: Diagnosis present

## 2021-11-29 DIAGNOSIS — D630 Anemia in neoplastic disease: Secondary | ICD-10-CM | POA: Diagnosis present

## 2021-11-29 DIAGNOSIS — T7840XA Allergy, unspecified, initial encounter: Secondary | ICD-10-CM | POA: Diagnosis not present

## 2021-11-29 DIAGNOSIS — C7802 Secondary malignant neoplasm of left lung: Secondary | ICD-10-CM | POA: Diagnosis present

## 2021-11-29 DIAGNOSIS — F05 Delirium due to known physiological condition: Secondary | ICD-10-CM | POA: Diagnosis present

## 2021-11-29 DIAGNOSIS — M109 Gout, unspecified: Secondary | ICD-10-CM | POA: Diagnosis present

## 2021-11-29 DIAGNOSIS — R4182 Altered mental status, unspecified: Secondary | ICD-10-CM | POA: Diagnosis not present

## 2021-11-29 DIAGNOSIS — C784 Secondary malignant neoplasm of small intestine: Secondary | ICD-10-CM | POA: Diagnosis present

## 2021-11-29 DIAGNOSIS — Z6832 Body mass index (BMI) 32.0-32.9, adult: Secondary | ICD-10-CM | POA: Diagnosis not present

## 2021-11-29 DIAGNOSIS — R627 Adult failure to thrive: Secondary | ICD-10-CM | POA: Diagnosis not present

## 2021-11-29 DIAGNOSIS — C439 Malignant melanoma of skin, unspecified: Secondary | ICD-10-CM | POA: Diagnosis not present

## 2021-11-29 DIAGNOSIS — C786 Secondary malignant neoplasm of retroperitoneum and peritoneum: Secondary | ICD-10-CM | POA: Diagnosis not present

## 2021-11-29 DIAGNOSIS — C7931 Secondary malignant neoplasm of brain: Secondary | ICD-10-CM | POA: Diagnosis present

## 2021-11-29 DIAGNOSIS — Z9981 Dependence on supplemental oxygen: Secondary | ICD-10-CM | POA: Diagnosis not present

## 2021-11-29 DIAGNOSIS — R918 Other nonspecific abnormal finding of lung field: Secondary | ICD-10-CM | POA: Diagnosis not present

## 2021-11-30 DIAGNOSIS — R0902 Hypoxemia: Secondary | ICD-10-CM | POA: Diagnosis present

## 2021-11-30 DIAGNOSIS — C7889 Secondary malignant neoplasm of other digestive organs: Secondary | ICD-10-CM | POA: Diagnosis present

## 2021-11-30 DIAGNOSIS — C7931 Secondary malignant neoplasm of brain: Secondary | ICD-10-CM | POA: Diagnosis present

## 2021-11-30 DIAGNOSIS — C439 Malignant melanoma of skin, unspecified: Secondary | ICD-10-CM | POA: Diagnosis not present

## 2021-11-30 DIAGNOSIS — C784 Secondary malignant neoplasm of small intestine: Secondary | ICD-10-CM | POA: Diagnosis present

## 2021-11-30 DIAGNOSIS — R4182 Altered mental status, unspecified: Secondary | ICD-10-CM | POA: Diagnosis not present

## 2021-11-30 DIAGNOSIS — R634 Abnormal weight loss: Secondary | ICD-10-CM | POA: Diagnosis present

## 2021-11-30 DIAGNOSIS — C786 Secondary malignant neoplasm of retroperitoneum and peritoneum: Secondary | ICD-10-CM | POA: Diagnosis not present

## 2021-11-30 DIAGNOSIS — N390 Urinary tract infection, site not specified: Secondary | ICD-10-CM | POA: Diagnosis present

## 2021-11-30 DIAGNOSIS — R918 Other nonspecific abnormal finding of lung field: Secondary | ICD-10-CM | POA: Diagnosis not present

## 2021-11-30 DIAGNOSIS — G893 Neoplasm related pain (acute) (chronic): Secondary | ICD-10-CM | POA: Diagnosis present

## 2021-11-30 DIAGNOSIS — C787 Secondary malignant neoplasm of liver and intrahepatic bile duct: Secondary | ICD-10-CM | POA: Diagnosis present

## 2021-11-30 DIAGNOSIS — M8458XA Pathological fracture in neoplastic disease, other specified site, initial encounter for fracture: Secondary | ICD-10-CM | POA: Diagnosis present

## 2021-11-30 DIAGNOSIS — R627 Adult failure to thrive: Secondary | ICD-10-CM | POA: Diagnosis not present

## 2021-11-30 DIAGNOSIS — I10 Essential (primary) hypertension: Secondary | ICD-10-CM | POA: Diagnosis present

## 2021-11-30 DIAGNOSIS — T451X5A Adverse effect of antineoplastic and immunosuppressive drugs, initial encounter: Secondary | ICD-10-CM | POA: Diagnosis present

## 2021-11-30 DIAGNOSIS — H43393 Other vitreous opacities, bilateral: Secondary | ICD-10-CM | POA: Diagnosis not present

## 2021-11-30 DIAGNOSIS — J45909 Unspecified asthma, uncomplicated: Secondary | ICD-10-CM | POA: Diagnosis present

## 2021-11-30 DIAGNOSIS — F05 Delirium due to known physiological condition: Secondary | ICD-10-CM | POA: Diagnosis present

## 2021-11-30 DIAGNOSIS — Z6832 Body mass index (BMI) 32.0-32.9, adult: Secondary | ICD-10-CM | POA: Diagnosis not present

## 2021-11-30 DIAGNOSIS — L270 Generalized skin eruption due to drugs and medicaments taken internally: Secondary | ICD-10-CM | POA: Diagnosis present

## 2021-11-30 DIAGNOSIS — T7840XA Allergy, unspecified, initial encounter: Secondary | ICD-10-CM | POA: Diagnosis not present

## 2021-11-30 DIAGNOSIS — R112 Nausea with vomiting, unspecified: Secondary | ICD-10-CM | POA: Diagnosis present

## 2021-11-30 DIAGNOSIS — E871 Hypo-osmolality and hyponatremia: Secondary | ICD-10-CM | POA: Diagnosis present

## 2021-11-30 DIAGNOSIS — M109 Gout, unspecified: Secondary | ICD-10-CM | POA: Diagnosis present

## 2021-11-30 DIAGNOSIS — D630 Anemia in neoplastic disease: Secondary | ICD-10-CM | POA: Diagnosis present

## 2021-11-30 DIAGNOSIS — C7971 Secondary malignant neoplasm of right adrenal gland: Secondary | ICD-10-CM | POA: Diagnosis not present

## 2021-11-30 DIAGNOSIS — R530 Neoplastic (malignant) related fatigue: Secondary | ICD-10-CM | POA: Diagnosis present

## 2021-11-30 DIAGNOSIS — T783XXA Angioneurotic edema, initial encounter: Secondary | ICD-10-CM | POA: Diagnosis present

## 2021-11-30 DIAGNOSIS — Z9981 Dependence on supplemental oxygen: Secondary | ICD-10-CM | POA: Diagnosis not present

## 2021-11-30 DIAGNOSIS — Z20822 Contact with and (suspected) exposure to covid-19: Secondary | ICD-10-CM | POA: Diagnosis present

## 2021-11-30 DIAGNOSIS — T7840XD Allergy, unspecified, subsequent encounter: Secondary | ICD-10-CM | POA: Diagnosis not present

## 2021-11-30 DIAGNOSIS — C7802 Secondary malignant neoplasm of left lung: Secondary | ICD-10-CM | POA: Diagnosis present

## 2021-12-06 DIAGNOSIS — C439 Malignant melanoma of skin, unspecified: Secondary | ICD-10-CM | POA: Diagnosis not present

## 2021-12-06 DIAGNOSIS — R530 Neoplastic (malignant) related fatigue: Secondary | ICD-10-CM | POA: Diagnosis not present

## 2021-12-06 DIAGNOSIS — C7931 Secondary malignant neoplasm of brain: Secondary | ICD-10-CM | POA: Diagnosis not present

## 2021-12-06 DIAGNOSIS — C4361 Malignant melanoma of right upper limb, including shoulder: Secondary | ICD-10-CM | POA: Diagnosis not present

## 2021-12-06 DIAGNOSIS — G893 Neoplasm related pain (acute) (chronic): Secondary | ICD-10-CM | POA: Diagnosis not present

## 2021-12-07 DIAGNOSIS — C439 Malignant melanoma of skin, unspecified: Secondary | ICD-10-CM | POA: Diagnosis not present

## 2021-12-07 DIAGNOSIS — C7931 Secondary malignant neoplasm of brain: Secondary | ICD-10-CM | POA: Diagnosis not present

## 2021-12-09 DIAGNOSIS — K529 Noninfective gastroenteritis and colitis, unspecified: Secondary | ICD-10-CM | POA: Diagnosis not present

## 2021-12-09 DIAGNOSIS — R112 Nausea with vomiting, unspecified: Secondary | ICD-10-CM | POA: Diagnosis not present

## 2021-12-09 DIAGNOSIS — C4361 Malignant melanoma of right upper limb, including shoulder: Secondary | ICD-10-CM | POA: Diagnosis not present

## 2021-12-09 DIAGNOSIS — C7931 Secondary malignant neoplasm of brain: Secondary | ICD-10-CM | POA: Diagnosis not present

## 2021-12-09 DIAGNOSIS — Z85820 Personal history of malignant melanoma of skin: Secondary | ICD-10-CM | POA: Diagnosis not present

## 2021-12-09 DIAGNOSIS — R197 Diarrhea, unspecified: Secondary | ICD-10-CM | POA: Diagnosis not present

## 2021-12-09 DIAGNOSIS — C439 Malignant melanoma of skin, unspecified: Secondary | ICD-10-CM | POA: Diagnosis not present

## 2021-12-09 DIAGNOSIS — C786 Secondary malignant neoplasm of retroperitoneum and peritoneum: Secondary | ICD-10-CM | POA: Diagnosis not present

## 2021-12-09 DIAGNOSIS — C787 Secondary malignant neoplasm of liver and intrahepatic bile duct: Secondary | ICD-10-CM | POA: Diagnosis not present

## 2021-12-09 DIAGNOSIS — R109 Unspecified abdominal pain: Secondary | ICD-10-CM | POA: Diagnosis not present

## 2021-12-09 DIAGNOSIS — C7971 Secondary malignant neoplasm of right adrenal gland: Secondary | ICD-10-CM | POA: Diagnosis not present

## 2021-12-09 DIAGNOSIS — G893 Neoplasm related pain (acute) (chronic): Secondary | ICD-10-CM | POA: Diagnosis not present

## 2021-12-14 DIAGNOSIS — G893 Neoplasm related pain (acute) (chronic): Secondary | ICD-10-CM | POA: Diagnosis not present

## 2021-12-14 DIAGNOSIS — R530 Neoplastic (malignant) related fatigue: Secondary | ICD-10-CM | POA: Diagnosis not present

## 2021-12-14 DIAGNOSIS — T451X5A Adverse effect of antineoplastic and immunosuppressive drugs, initial encounter: Secondary | ICD-10-CM | POA: Diagnosis not present

## 2021-12-14 DIAGNOSIS — C7931 Secondary malignant neoplasm of brain: Secondary | ICD-10-CM | POA: Diagnosis not present

## 2021-12-14 DIAGNOSIS — K521 Toxic gastroenteritis and colitis: Secondary | ICD-10-CM | POA: Diagnosis not present

## 2021-12-14 DIAGNOSIS — C439 Malignant melanoma of skin, unspecified: Secondary | ICD-10-CM | POA: Diagnosis not present

## 2021-12-14 DIAGNOSIS — R112 Nausea with vomiting, unspecified: Secondary | ICD-10-CM | POA: Diagnosis not present

## 2021-12-17 DIAGNOSIS — Z79899 Other long term (current) drug therapy: Secondary | ICD-10-CM | POA: Diagnosis not present

## 2021-12-17 DIAGNOSIS — K521 Toxic gastroenteritis and colitis: Secondary | ICD-10-CM | POA: Diagnosis not present

## 2021-12-17 DIAGNOSIS — C7931 Secondary malignant neoplasm of brain: Secondary | ICD-10-CM | POA: Diagnosis not present

## 2021-12-17 DIAGNOSIS — T50905A Adverse effect of unspecified drugs, medicaments and biological substances, initial encounter: Secondary | ICD-10-CM | POA: Diagnosis not present

## 2021-12-17 DIAGNOSIS — Z5112 Encounter for antineoplastic immunotherapy: Secondary | ICD-10-CM | POA: Diagnosis not present

## 2021-12-17 DIAGNOSIS — R918 Other nonspecific abnormal finding of lung field: Secondary | ICD-10-CM | POA: Diagnosis not present

## 2021-12-17 DIAGNOSIS — C439 Malignant melanoma of skin, unspecified: Secondary | ICD-10-CM | POA: Diagnosis not present

## 2021-12-24 DIAGNOSIS — R52 Pain, unspecified: Secondary | ICD-10-CM | POA: Diagnosis not present

## 2021-12-24 DIAGNOSIS — C7889 Secondary malignant neoplasm of other digestive organs: Secondary | ICD-10-CM | POA: Diagnosis present

## 2021-12-24 DIAGNOSIS — D649 Anemia, unspecified: Secondary | ICD-10-CM | POA: Diagnosis not present

## 2021-12-24 DIAGNOSIS — R109 Unspecified abdominal pain: Secondary | ICD-10-CM | POA: Diagnosis not present

## 2021-12-24 DIAGNOSIS — C786 Secondary malignant neoplasm of retroperitoneum and peritoneum: Secondary | ICD-10-CM | POA: Diagnosis present

## 2021-12-24 DIAGNOSIS — R251 Tremor, unspecified: Secondary | ICD-10-CM | POA: Diagnosis not present

## 2021-12-24 DIAGNOSIS — C439 Malignant melanoma of skin, unspecified: Secondary | ICD-10-CM | POA: Diagnosis not present

## 2021-12-24 DIAGNOSIS — E2740 Unspecified adrenocortical insufficiency: Secondary | ICD-10-CM | POA: Diagnosis present

## 2021-12-24 DIAGNOSIS — M4856XA Collapsed vertebra, not elsewhere classified, lumbar region, initial encounter for fracture: Secondary | ICD-10-CM | POA: Diagnosis present

## 2021-12-24 DIAGNOSIS — R600 Localized edema: Secondary | ICD-10-CM | POA: Diagnosis not present

## 2021-12-24 DIAGNOSIS — R10817 Generalized abdominal tenderness: Secondary | ICD-10-CM | POA: Diagnosis not present

## 2021-12-24 DIAGNOSIS — R1084 Generalized abdominal pain: Secondary | ICD-10-CM | POA: Diagnosis not present

## 2021-12-24 DIAGNOSIS — C784 Secondary malignant neoplasm of small intestine: Secondary | ICD-10-CM | POA: Diagnosis present

## 2021-12-24 DIAGNOSIS — G893 Neoplasm related pain (acute) (chronic): Secondary | ICD-10-CM | POA: Diagnosis not present

## 2021-12-24 DIAGNOSIS — C7800 Secondary malignant neoplasm of unspecified lung: Secondary | ICD-10-CM | POA: Diagnosis present

## 2021-12-24 DIAGNOSIS — S0502XA Injury of conjunctiva and corneal abrasion without foreign body, left eye, initial encounter: Secondary | ICD-10-CM | POA: Diagnosis not present

## 2021-12-24 DIAGNOSIS — R2681 Unsteadiness on feet: Secondary | ICD-10-CM | POA: Diagnosis not present

## 2021-12-24 DIAGNOSIS — R400 Somnolence: Secondary | ICD-10-CM | POA: Diagnosis not present

## 2021-12-24 DIAGNOSIS — R498 Other voice and resonance disorders: Secondary | ICD-10-CM | POA: Diagnosis not present

## 2021-12-24 DIAGNOSIS — K633 Ulcer of intestine: Secondary | ICD-10-CM | POA: Diagnosis present

## 2021-12-24 DIAGNOSIS — R5383 Other fatigue: Secondary | ICD-10-CM | POA: Diagnosis not present

## 2021-12-24 DIAGNOSIS — R488 Other symbolic dysfunctions: Secondary | ICD-10-CM | POA: Diagnosis not present

## 2021-12-24 DIAGNOSIS — C787 Secondary malignant neoplasm of liver and intrahepatic bile duct: Secondary | ICD-10-CM | POA: Diagnosis present

## 2021-12-24 DIAGNOSIS — B370 Candidal stomatitis: Secondary | ICD-10-CM | POA: Diagnosis not present

## 2021-12-24 DIAGNOSIS — A0472 Enterocolitis due to Clostridium difficile, not specified as recurrent: Secondary | ICD-10-CM | POA: Diagnosis not present

## 2021-12-24 DIAGNOSIS — T380X5A Adverse effect of glucocorticoids and synthetic analogues, initial encounter: Secondary | ICD-10-CM | POA: Diagnosis not present

## 2021-12-24 DIAGNOSIS — E860 Dehydration: Secondary | ICD-10-CM | POA: Diagnosis not present

## 2021-12-24 DIAGNOSIS — X58XXXA Exposure to other specified factors, initial encounter: Secondary | ICD-10-CM | POA: Diagnosis not present

## 2021-12-24 DIAGNOSIS — C7951 Secondary malignant neoplasm of bone: Secondary | ICD-10-CM | POA: Diagnosis not present

## 2021-12-24 DIAGNOSIS — F902 Attention-deficit hyperactivity disorder, combined type: Secondary | ICD-10-CM | POA: Diagnosis not present

## 2021-12-24 DIAGNOSIS — E43 Unspecified severe protein-calorie malnutrition: Secondary | ICD-10-CM | POA: Diagnosis present

## 2021-12-24 DIAGNOSIS — R140 Abdominal distension (gaseous): Secondary | ICD-10-CM | POA: Diagnosis not present

## 2021-12-24 DIAGNOSIS — K52832 Lymphocytic colitis: Secondary | ICD-10-CM | POA: Diagnosis not present

## 2021-12-24 DIAGNOSIS — K6389 Other specified diseases of intestine: Secondary | ICD-10-CM | POA: Diagnosis not present

## 2021-12-24 DIAGNOSIS — K567 Ileus, unspecified: Secondary | ICD-10-CM | POA: Diagnosis not present

## 2021-12-24 DIAGNOSIS — R530 Neoplastic (malignant) related fatigue: Secondary | ICD-10-CM | POA: Diagnosis not present

## 2021-12-24 DIAGNOSIS — M6259 Muscle wasting and atrophy, not elsewhere classified, multiple sites: Secondary | ICD-10-CM | POA: Diagnosis not present

## 2021-12-24 DIAGNOSIS — R5381 Other malaise: Secondary | ICD-10-CM | POA: Diagnosis not present

## 2021-12-24 DIAGNOSIS — I517 Cardiomegaly: Secondary | ICD-10-CM | POA: Diagnosis not present

## 2021-12-24 DIAGNOSIS — M109 Gout, unspecified: Secondary | ICD-10-CM | POA: Diagnosis not present

## 2021-12-24 DIAGNOSIS — C801 Malignant (primary) neoplasm, unspecified: Secondary | ICD-10-CM | POA: Diagnosis not present

## 2021-12-24 DIAGNOSIS — F05 Delirium due to known physiological condition: Secondary | ICD-10-CM | POA: Diagnosis not present

## 2021-12-24 DIAGNOSIS — B309 Viral conjunctivitis, unspecified: Secondary | ICD-10-CM | POA: Diagnosis not present

## 2021-12-24 DIAGNOSIS — I350 Nonrheumatic aortic (valve) stenosis: Secondary | ICD-10-CM | POA: Diagnosis not present

## 2021-12-24 DIAGNOSIS — C7802 Secondary malignant neoplasm of left lung: Secondary | ICD-10-CM | POA: Diagnosis not present

## 2021-12-24 DIAGNOSIS — D638 Anemia in other chronic diseases classified elsewhere: Secondary | ICD-10-CM | POA: Diagnosis present

## 2021-12-24 DIAGNOSIS — K8689 Other specified diseases of pancreas: Secondary | ICD-10-CM | POA: Diagnosis not present

## 2021-12-24 DIAGNOSIS — C7931 Secondary malignant neoplasm of brain: Secondary | ICD-10-CM | POA: Diagnosis present

## 2021-12-24 DIAGNOSIS — K521 Toxic gastroenteritis and colitis: Secondary | ICD-10-CM | POA: Diagnosis present

## 2021-12-24 DIAGNOSIS — I10 Essential (primary) hypertension: Secondary | ICD-10-CM | POA: Diagnosis not present

## 2021-12-24 DIAGNOSIS — R531 Weakness: Secondary | ICD-10-CM | POA: Diagnosis not present

## 2021-12-24 DIAGNOSIS — J45909 Unspecified asthma, uncomplicated: Secondary | ICD-10-CM | POA: Diagnosis present

## 2021-12-24 DIAGNOSIS — Z7401 Bed confinement status: Secondary | ICD-10-CM | POA: Diagnosis not present

## 2021-12-24 DIAGNOSIS — S0501XA Injury of conjunctiva and corneal abrasion without foreign body, right eye, initial encounter: Secondary | ICD-10-CM | POA: Diagnosis not present

## 2021-12-24 DIAGNOSIS — T83518A Infection and inflammatory reaction due to other urinary catheter, initial encounter: Secondary | ICD-10-CM | POA: Diagnosis not present

## 2021-12-24 DIAGNOSIS — R4182 Altered mental status, unspecified: Secondary | ICD-10-CM | POA: Diagnosis not present

## 2021-12-24 DIAGNOSIS — R627 Adult failure to thrive: Secondary | ICD-10-CM | POA: Diagnosis not present

## 2021-12-24 DIAGNOSIS — R1312 Dysphagia, oropharyngeal phase: Secondary | ICD-10-CM | POA: Diagnosis not present

## 2021-12-24 DIAGNOSIS — G4700 Insomnia, unspecified: Secondary | ICD-10-CM | POA: Diagnosis not present

## 2021-12-24 DIAGNOSIS — K51814 Other ulcerative colitis with abscess: Secondary | ICD-10-CM | POA: Diagnosis not present

## 2021-12-24 DIAGNOSIS — N390 Urinary tract infection, site not specified: Secondary | ICD-10-CM | POA: Diagnosis not present

## 2021-12-24 DIAGNOSIS — T83511A Infection and inflammatory reaction due to indwelling urethral catheter, initial encounter: Secondary | ICD-10-CM | POA: Diagnosis not present

## 2021-12-24 DIAGNOSIS — C7971 Secondary malignant neoplasm of right adrenal gland: Secondary | ICD-10-CM | POA: Diagnosis present

## 2021-12-24 DIAGNOSIS — D696 Thrombocytopenia, unspecified: Secondary | ICD-10-CM | POA: Diagnosis present

## 2021-12-24 DIAGNOSIS — S32010D Wedge compression fracture of first lumbar vertebra, subsequent encounter for fracture with routine healing: Secondary | ICD-10-CM | POA: Diagnosis not present

## 2021-12-24 DIAGNOSIS — G720 Drug-induced myopathy: Secondary | ICD-10-CM | POA: Diagnosis not present

## 2021-12-24 DIAGNOSIS — R160 Hepatomegaly, not elsewhere classified: Secondary | ICD-10-CM | POA: Diagnosis not present

## 2021-12-24 DIAGNOSIS — R197 Diarrhea, unspecified: Secondary | ICD-10-CM | POA: Diagnosis not present

## 2021-12-24 DIAGNOSIS — E871 Hypo-osmolality and hyponatremia: Secondary | ICD-10-CM | POA: Diagnosis present

## 2021-12-24 DIAGNOSIS — L270 Generalized skin eruption due to drugs and medicaments taken internally: Secondary | ICD-10-CM | POA: Diagnosis not present

## 2021-12-24 DIAGNOSIS — K529 Noninfective gastroenteritis and colitis, unspecified: Secondary | ICD-10-CM | POA: Diagnosis not present

## 2021-12-24 DIAGNOSIS — Z789 Other specified health status: Secondary | ICD-10-CM | POA: Diagnosis not present

## 2021-12-24 DIAGNOSIS — Z736 Limitation of activities due to disability: Secondary | ICD-10-CM | POA: Diagnosis not present

## 2021-12-24 DIAGNOSIS — Z515 Encounter for palliative care: Secondary | ICD-10-CM | POA: Diagnosis not present

## 2021-12-27 DIAGNOSIS — I10 Essential (primary) hypertension: Secondary | ICD-10-CM | POA: Diagnosis present

## 2021-12-27 DIAGNOSIS — T83518A Infection and inflammatory reaction due to other urinary catheter, initial encounter: Secondary | ICD-10-CM | POA: Diagnosis not present

## 2021-12-27 DIAGNOSIS — R1084 Generalized abdominal pain: Secondary | ICD-10-CM | POA: Diagnosis not present

## 2021-12-27 DIAGNOSIS — G4700 Insomnia, unspecified: Secondary | ICD-10-CM | POA: Diagnosis not present

## 2021-12-27 DIAGNOSIS — R160 Hepatomegaly, not elsewhere classified: Secondary | ICD-10-CM | POA: Diagnosis not present

## 2021-12-27 DIAGNOSIS — R600 Localized edema: Secondary | ICD-10-CM | POA: Diagnosis not present

## 2021-12-27 DIAGNOSIS — D696 Thrombocytopenia, unspecified: Secondary | ICD-10-CM | POA: Diagnosis present

## 2021-12-27 DIAGNOSIS — G720 Drug-induced myopathy: Secondary | ICD-10-CM | POA: Diagnosis not present

## 2021-12-27 DIAGNOSIS — F05 Delirium due to known physiological condition: Secondary | ICD-10-CM | POA: Diagnosis not present

## 2021-12-27 DIAGNOSIS — E871 Hypo-osmolality and hyponatremia: Secondary | ICD-10-CM | POA: Diagnosis present

## 2021-12-27 DIAGNOSIS — C7971 Secondary malignant neoplasm of right adrenal gland: Secondary | ICD-10-CM | POA: Diagnosis present

## 2021-12-27 DIAGNOSIS — C7802 Secondary malignant neoplasm of left lung: Secondary | ICD-10-CM | POA: Diagnosis not present

## 2021-12-27 DIAGNOSIS — R498 Other voice and resonance disorders: Secondary | ICD-10-CM | POA: Diagnosis not present

## 2021-12-27 DIAGNOSIS — S32010D Wedge compression fracture of first lumbar vertebra, subsequent encounter for fracture with routine healing: Secondary | ICD-10-CM | POA: Diagnosis not present

## 2021-12-27 DIAGNOSIS — R1312 Dysphagia, oropharyngeal phase: Secondary | ICD-10-CM | POA: Diagnosis not present

## 2021-12-27 DIAGNOSIS — Z7401 Bed confinement status: Secondary | ICD-10-CM | POA: Diagnosis not present

## 2021-12-27 DIAGNOSIS — C7951 Secondary malignant neoplasm of bone: Secondary | ICD-10-CM | POA: Diagnosis present

## 2021-12-27 DIAGNOSIS — R627 Adult failure to thrive: Secondary | ICD-10-CM | POA: Diagnosis not present

## 2021-12-27 DIAGNOSIS — K567 Ileus, unspecified: Secondary | ICD-10-CM | POA: Diagnosis not present

## 2021-12-27 DIAGNOSIS — M6259 Muscle wasting and atrophy, not elsewhere classified, multiple sites: Secondary | ICD-10-CM | POA: Diagnosis not present

## 2021-12-27 DIAGNOSIS — C7931 Secondary malignant neoplasm of brain: Secondary | ICD-10-CM | POA: Diagnosis present

## 2021-12-27 DIAGNOSIS — N390 Urinary tract infection, site not specified: Secondary | ICD-10-CM | POA: Diagnosis not present

## 2021-12-27 DIAGNOSIS — R109 Unspecified abdominal pain: Secondary | ICD-10-CM | POA: Diagnosis not present

## 2021-12-27 DIAGNOSIS — R488 Other symbolic dysfunctions: Secondary | ICD-10-CM | POA: Diagnosis not present

## 2021-12-27 DIAGNOSIS — R400 Somnolence: Secondary | ICD-10-CM | POA: Diagnosis not present

## 2021-12-27 DIAGNOSIS — C7800 Secondary malignant neoplasm of unspecified lung: Secondary | ICD-10-CM | POA: Diagnosis present

## 2021-12-27 DIAGNOSIS — T380X5A Adverse effect of glucocorticoids and synthetic analogues, initial encounter: Secondary | ICD-10-CM | POA: Diagnosis not present

## 2021-12-27 DIAGNOSIS — R5383 Other fatigue: Secondary | ICD-10-CM | POA: Diagnosis not present

## 2021-12-27 DIAGNOSIS — C784 Secondary malignant neoplasm of small intestine: Secondary | ICD-10-CM | POA: Diagnosis present

## 2021-12-27 DIAGNOSIS — B309 Viral conjunctivitis, unspecified: Secondary | ICD-10-CM | POA: Diagnosis not present

## 2021-12-27 DIAGNOSIS — M4856XA Collapsed vertebra, not elsewhere classified, lumbar region, initial encounter for fracture: Secondary | ICD-10-CM | POA: Diagnosis present

## 2021-12-27 DIAGNOSIS — G893 Neoplasm related pain (acute) (chronic): Secondary | ICD-10-CM | POA: Diagnosis not present

## 2021-12-27 DIAGNOSIS — K52832 Lymphocytic colitis: Secondary | ICD-10-CM | POA: Diagnosis not present

## 2021-12-27 DIAGNOSIS — E860 Dehydration: Secondary | ICD-10-CM | POA: Diagnosis not present

## 2021-12-27 DIAGNOSIS — B370 Candidal stomatitis: Secondary | ICD-10-CM | POA: Diagnosis not present

## 2021-12-27 DIAGNOSIS — R197 Diarrhea, unspecified: Secondary | ICD-10-CM | POA: Diagnosis not present

## 2021-12-27 DIAGNOSIS — R4182 Altered mental status, unspecified: Secondary | ICD-10-CM | POA: Diagnosis not present

## 2021-12-27 DIAGNOSIS — E2740 Unspecified adrenocortical insufficiency: Secondary | ICD-10-CM | POA: Diagnosis present

## 2021-12-27 DIAGNOSIS — R251 Tremor, unspecified: Secondary | ICD-10-CM | POA: Diagnosis not present

## 2021-12-27 DIAGNOSIS — R2681 Unsteadiness on feet: Secondary | ICD-10-CM | POA: Diagnosis not present

## 2021-12-27 DIAGNOSIS — J45909 Unspecified asthma, uncomplicated: Secondary | ICD-10-CM | POA: Diagnosis present

## 2021-12-27 DIAGNOSIS — T83511A Infection and inflammatory reaction due to indwelling urethral catheter, initial encounter: Secondary | ICD-10-CM | POA: Diagnosis not present

## 2021-12-27 DIAGNOSIS — M109 Gout, unspecified: Secondary | ICD-10-CM | POA: Diagnosis not present

## 2021-12-27 DIAGNOSIS — C787 Secondary malignant neoplasm of liver and intrahepatic bile duct: Secondary | ICD-10-CM | POA: Diagnosis present

## 2021-12-27 DIAGNOSIS — K51814 Other ulcerative colitis with abscess: Secondary | ICD-10-CM | POA: Diagnosis not present

## 2021-12-27 DIAGNOSIS — K521 Toxic gastroenteritis and colitis: Secondary | ICD-10-CM | POA: Diagnosis present

## 2021-12-27 DIAGNOSIS — F902 Attention-deficit hyperactivity disorder, combined type: Secondary | ICD-10-CM | POA: Diagnosis not present

## 2021-12-27 DIAGNOSIS — C439 Malignant melanoma of skin, unspecified: Secondary | ICD-10-CM | POA: Diagnosis present

## 2021-12-27 DIAGNOSIS — S0501XA Injury of conjunctiva and corneal abrasion without foreign body, right eye, initial encounter: Secondary | ICD-10-CM | POA: Diagnosis not present

## 2021-12-27 DIAGNOSIS — R52 Pain, unspecified: Secondary | ICD-10-CM | POA: Diagnosis not present

## 2021-12-27 DIAGNOSIS — K529 Noninfective gastroenteritis and colitis, unspecified: Secondary | ICD-10-CM | POA: Diagnosis not present

## 2021-12-27 DIAGNOSIS — R140 Abdominal distension (gaseous): Secondary | ICD-10-CM | POA: Diagnosis not present

## 2021-12-27 DIAGNOSIS — X58XXXA Exposure to other specified factors, initial encounter: Secondary | ICD-10-CM | POA: Diagnosis not present

## 2021-12-27 DIAGNOSIS — Z736 Limitation of activities due to disability: Secondary | ICD-10-CM | POA: Diagnosis not present

## 2021-12-27 DIAGNOSIS — C786 Secondary malignant neoplasm of retroperitoneum and peritoneum: Secondary | ICD-10-CM | POA: Diagnosis present

## 2021-12-27 DIAGNOSIS — E43 Unspecified severe protein-calorie malnutrition: Secondary | ICD-10-CM | POA: Diagnosis present

## 2021-12-27 DIAGNOSIS — L270 Generalized skin eruption due to drugs and medicaments taken internally: Secondary | ICD-10-CM | POA: Diagnosis not present

## 2021-12-27 DIAGNOSIS — D649 Anemia, unspecified: Secondary | ICD-10-CM | POA: Diagnosis not present

## 2021-12-27 DIAGNOSIS — K633 Ulcer of intestine: Secondary | ICD-10-CM | POA: Diagnosis present

## 2021-12-27 DIAGNOSIS — C7889 Secondary malignant neoplasm of other digestive organs: Secondary | ICD-10-CM | POA: Diagnosis present

## 2021-12-27 DIAGNOSIS — C801 Malignant (primary) neoplasm, unspecified: Secondary | ICD-10-CM | POA: Diagnosis not present

## 2021-12-27 DIAGNOSIS — I517 Cardiomegaly: Secondary | ICD-10-CM | POA: Diagnosis not present

## 2021-12-27 DIAGNOSIS — S0502XA Injury of conjunctiva and corneal abrasion without foreign body, left eye, initial encounter: Secondary | ICD-10-CM | POA: Diagnosis not present

## 2021-12-27 DIAGNOSIS — R531 Weakness: Secondary | ICD-10-CM | POA: Diagnosis not present

## 2021-12-27 DIAGNOSIS — Z789 Other specified health status: Secondary | ICD-10-CM | POA: Diagnosis not present

## 2021-12-27 DIAGNOSIS — K8689 Other specified diseases of pancreas: Secondary | ICD-10-CM | POA: Diagnosis not present

## 2021-12-27 DIAGNOSIS — I350 Nonrheumatic aortic (valve) stenosis: Secondary | ICD-10-CM | POA: Diagnosis not present

## 2021-12-27 DIAGNOSIS — Z515 Encounter for palliative care: Secondary | ICD-10-CM | POA: Diagnosis not present

## 2021-12-27 DIAGNOSIS — A0472 Enterocolitis due to Clostridium difficile, not specified as recurrent: Secondary | ICD-10-CM | POA: Diagnosis not present

## 2021-12-27 DIAGNOSIS — K6389 Other specified diseases of intestine: Secondary | ICD-10-CM | POA: Diagnosis not present

## 2021-12-27 DIAGNOSIS — R530 Neoplastic (malignant) related fatigue: Secondary | ICD-10-CM | POA: Diagnosis not present

## 2021-12-27 DIAGNOSIS — D638 Anemia in other chronic diseases classified elsewhere: Secondary | ICD-10-CM | POA: Diagnosis present

## 2021-12-28 DIAGNOSIS — C439 Malignant melanoma of skin, unspecified: Secondary | ICD-10-CM | POA: Diagnosis not present

## 2021-12-28 DIAGNOSIS — G893 Neoplasm related pain (acute) (chronic): Secondary | ICD-10-CM | POA: Diagnosis not present

## 2021-12-28 DIAGNOSIS — E43 Unspecified severe protein-calorie malnutrition: Secondary | ICD-10-CM | POA: Diagnosis not present

## 2021-12-28 DIAGNOSIS — K521 Toxic gastroenteritis and colitis: Secondary | ICD-10-CM | POA: Diagnosis not present

## 2021-12-28 DIAGNOSIS — T380X5A Adverse effect of glucocorticoids and synthetic analogues, initial encounter: Secondary | ICD-10-CM | POA: Diagnosis not present

## 2021-12-28 DIAGNOSIS — G720 Drug-induced myopathy: Secondary | ICD-10-CM | POA: Diagnosis not present

## 2021-12-28 DIAGNOSIS — Z515 Encounter for palliative care: Secondary | ICD-10-CM | POA: Diagnosis not present

## 2021-12-28 DIAGNOSIS — R531 Weakness: Secondary | ICD-10-CM | POA: Diagnosis not present

## 2021-12-28 DIAGNOSIS — R1084 Generalized abdominal pain: Secondary | ICD-10-CM | POA: Diagnosis not present

## 2021-12-29 DIAGNOSIS — R531 Weakness: Secondary | ICD-10-CM | POA: Diagnosis not present

## 2021-12-29 DIAGNOSIS — E43 Unspecified severe protein-calorie malnutrition: Secondary | ICD-10-CM | POA: Diagnosis not present

## 2021-12-29 DIAGNOSIS — K521 Toxic gastroenteritis and colitis: Secondary | ICD-10-CM | POA: Diagnosis not present

## 2021-12-29 DIAGNOSIS — C439 Malignant melanoma of skin, unspecified: Secondary | ICD-10-CM | POA: Diagnosis not present

## 2021-12-29 DIAGNOSIS — G720 Drug-induced myopathy: Secondary | ICD-10-CM | POA: Diagnosis not present

## 2021-12-29 DIAGNOSIS — T380X5A Adverse effect of glucocorticoids and synthetic analogues, initial encounter: Secondary | ICD-10-CM | POA: Diagnosis not present

## 2021-12-29 DIAGNOSIS — G893 Neoplasm related pain (acute) (chronic): Secondary | ICD-10-CM | POA: Diagnosis not present

## 2021-12-29 DIAGNOSIS — Z515 Encounter for palliative care: Secondary | ICD-10-CM | POA: Diagnosis not present

## 2021-12-29 DIAGNOSIS — R1084 Generalized abdominal pain: Secondary | ICD-10-CM | POA: Diagnosis not present

## 2021-12-30 DIAGNOSIS — Z515 Encounter for palliative care: Secondary | ICD-10-CM | POA: Diagnosis not present

## 2021-12-30 DIAGNOSIS — T380X5A Adverse effect of glucocorticoids and synthetic analogues, initial encounter: Secondary | ICD-10-CM | POA: Diagnosis not present

## 2021-12-30 DIAGNOSIS — C439 Malignant melanoma of skin, unspecified: Secondary | ICD-10-CM | POA: Diagnosis not present

## 2021-12-30 DIAGNOSIS — E43 Unspecified severe protein-calorie malnutrition: Secondary | ICD-10-CM | POA: Diagnosis not present

## 2021-12-30 DIAGNOSIS — G893 Neoplasm related pain (acute) (chronic): Secondary | ICD-10-CM | POA: Diagnosis not present

## 2021-12-30 DIAGNOSIS — R531 Weakness: Secondary | ICD-10-CM | POA: Diagnosis not present

## 2021-12-30 DIAGNOSIS — R1084 Generalized abdominal pain: Secondary | ICD-10-CM | POA: Diagnosis not present

## 2021-12-30 DIAGNOSIS — K521 Toxic gastroenteritis and colitis: Secondary | ICD-10-CM | POA: Diagnosis not present

## 2021-12-30 DIAGNOSIS — G720 Drug-induced myopathy: Secondary | ICD-10-CM | POA: Diagnosis not present

## 2021-12-31 DIAGNOSIS — R1084 Generalized abdominal pain: Secondary | ICD-10-CM | POA: Diagnosis not present

## 2021-12-31 DIAGNOSIS — Z515 Encounter for palliative care: Secondary | ICD-10-CM | POA: Diagnosis not present

## 2021-12-31 DIAGNOSIS — T380X5A Adverse effect of glucocorticoids and synthetic analogues, initial encounter: Secondary | ICD-10-CM | POA: Diagnosis not present

## 2021-12-31 DIAGNOSIS — E43 Unspecified severe protein-calorie malnutrition: Secondary | ICD-10-CM | POA: Diagnosis not present

## 2021-12-31 DIAGNOSIS — G720 Drug-induced myopathy: Secondary | ICD-10-CM | POA: Diagnosis not present

## 2021-12-31 DIAGNOSIS — K521 Toxic gastroenteritis and colitis: Secondary | ICD-10-CM | POA: Diagnosis not present

## 2021-12-31 DIAGNOSIS — G893 Neoplasm related pain (acute) (chronic): Secondary | ICD-10-CM | POA: Diagnosis not present

## 2021-12-31 DIAGNOSIS — C439 Malignant melanoma of skin, unspecified: Secondary | ICD-10-CM | POA: Diagnosis not present

## 2021-12-31 DIAGNOSIS — R531 Weakness: Secondary | ICD-10-CM | POA: Diagnosis not present

## 2022-01-01 DIAGNOSIS — C439 Malignant melanoma of skin, unspecified: Secondary | ICD-10-CM | POA: Diagnosis not present

## 2022-01-01 DIAGNOSIS — K521 Toxic gastroenteritis and colitis: Secondary | ICD-10-CM | POA: Diagnosis not present

## 2022-01-01 DIAGNOSIS — Z789 Other specified health status: Secondary | ICD-10-CM | POA: Diagnosis not present

## 2022-01-01 DIAGNOSIS — T380X5A Adverse effect of glucocorticoids and synthetic analogues, initial encounter: Secondary | ICD-10-CM | POA: Diagnosis not present

## 2022-01-01 DIAGNOSIS — R531 Weakness: Secondary | ICD-10-CM | POA: Diagnosis not present

## 2022-01-01 DIAGNOSIS — G720 Drug-induced myopathy: Secondary | ICD-10-CM | POA: Diagnosis not present

## 2022-01-01 DIAGNOSIS — F05 Delirium due to known physiological condition: Secondary | ICD-10-CM | POA: Diagnosis not present

## 2022-01-01 DIAGNOSIS — R1084 Generalized abdominal pain: Secondary | ICD-10-CM | POA: Diagnosis not present

## 2022-01-01 DIAGNOSIS — R251 Tremor, unspecified: Secondary | ICD-10-CM | POA: Diagnosis not present

## 2022-01-01 DIAGNOSIS — R400 Somnolence: Secondary | ICD-10-CM | POA: Diagnosis not present

## 2022-01-01 DIAGNOSIS — Z515 Encounter for palliative care: Secondary | ICD-10-CM | POA: Diagnosis not present

## 2022-01-01 DIAGNOSIS — E43 Unspecified severe protein-calorie malnutrition: Secondary | ICD-10-CM | POA: Diagnosis not present

## 2022-01-02 DIAGNOSIS — R1084 Generalized abdominal pain: Secondary | ICD-10-CM | POA: Diagnosis not present

## 2022-01-02 DIAGNOSIS — R251 Tremor, unspecified: Secondary | ICD-10-CM | POA: Diagnosis not present

## 2022-01-02 DIAGNOSIS — K521 Toxic gastroenteritis and colitis: Secondary | ICD-10-CM | POA: Diagnosis not present

## 2022-01-02 DIAGNOSIS — R531 Weakness: Secondary | ICD-10-CM | POA: Diagnosis not present

## 2022-01-02 DIAGNOSIS — F05 Delirium due to known physiological condition: Secondary | ICD-10-CM | POA: Diagnosis not present

## 2022-01-02 DIAGNOSIS — T380X5A Adverse effect of glucocorticoids and synthetic analogues, initial encounter: Secondary | ICD-10-CM | POA: Diagnosis not present

## 2022-01-02 DIAGNOSIS — C439 Malignant melanoma of skin, unspecified: Secondary | ICD-10-CM | POA: Diagnosis not present

## 2022-01-02 DIAGNOSIS — R400 Somnolence: Secondary | ICD-10-CM | POA: Diagnosis not present

## 2022-01-02 DIAGNOSIS — Z515 Encounter for palliative care: Secondary | ICD-10-CM | POA: Diagnosis not present

## 2022-01-02 DIAGNOSIS — E43 Unspecified severe protein-calorie malnutrition: Secondary | ICD-10-CM | POA: Diagnosis not present

## 2022-01-02 DIAGNOSIS — G720 Drug-induced myopathy: Secondary | ICD-10-CM | POA: Diagnosis not present

## 2022-01-02 DIAGNOSIS — Z789 Other specified health status: Secondary | ICD-10-CM | POA: Diagnosis not present

## 2022-01-04 DIAGNOSIS — T380X5A Adverse effect of glucocorticoids and synthetic analogues, initial encounter: Secondary | ICD-10-CM | POA: Diagnosis not present

## 2022-01-04 DIAGNOSIS — G720 Drug-induced myopathy: Secondary | ICD-10-CM | POA: Diagnosis not present

## 2022-01-04 DIAGNOSIS — E43 Unspecified severe protein-calorie malnutrition: Secondary | ICD-10-CM | POA: Diagnosis not present

## 2022-01-04 DIAGNOSIS — K521 Toxic gastroenteritis and colitis: Secondary | ICD-10-CM | POA: Diagnosis not present

## 2022-01-04 DIAGNOSIS — R531 Weakness: Secondary | ICD-10-CM | POA: Diagnosis not present

## 2022-01-04 DIAGNOSIS — I350 Nonrheumatic aortic (valve) stenosis: Secondary | ICD-10-CM | POA: Diagnosis not present

## 2022-01-04 DIAGNOSIS — C439 Malignant melanoma of skin, unspecified: Secondary | ICD-10-CM | POA: Diagnosis not present

## 2022-01-04 DIAGNOSIS — I517 Cardiomegaly: Secondary | ICD-10-CM | POA: Diagnosis not present

## 2022-01-04 DIAGNOSIS — R1084 Generalized abdominal pain: Secondary | ICD-10-CM | POA: Diagnosis not present

## 2022-01-04 DIAGNOSIS — Z515 Encounter for palliative care: Secondary | ICD-10-CM | POA: Diagnosis not present

## 2022-01-05 DIAGNOSIS — R1084 Generalized abdominal pain: Secondary | ICD-10-CM | POA: Diagnosis not present

## 2022-01-05 DIAGNOSIS — G720 Drug-induced myopathy: Secondary | ICD-10-CM | POA: Diagnosis not present

## 2022-01-05 DIAGNOSIS — Z515 Encounter for palliative care: Secondary | ICD-10-CM | POA: Diagnosis not present

## 2022-01-05 DIAGNOSIS — R531 Weakness: Secondary | ICD-10-CM | POA: Diagnosis not present

## 2022-01-05 DIAGNOSIS — E43 Unspecified severe protein-calorie malnutrition: Secondary | ICD-10-CM | POA: Diagnosis not present

## 2022-01-05 DIAGNOSIS — T380X5A Adverse effect of glucocorticoids and synthetic analogues, initial encounter: Secondary | ICD-10-CM | POA: Diagnosis not present

## 2022-01-05 DIAGNOSIS — K521 Toxic gastroenteritis and colitis: Secondary | ICD-10-CM | POA: Diagnosis not present

## 2022-01-05 DIAGNOSIS — C439 Malignant melanoma of skin, unspecified: Secondary | ICD-10-CM | POA: Diagnosis not present

## 2022-01-06 DIAGNOSIS — R600 Localized edema: Secondary | ICD-10-CM | POA: Diagnosis not present

## 2022-01-06 DIAGNOSIS — G720 Drug-induced myopathy: Secondary | ICD-10-CM | POA: Diagnosis not present

## 2022-01-06 DIAGNOSIS — C439 Malignant melanoma of skin, unspecified: Secondary | ICD-10-CM | POA: Diagnosis not present

## 2022-01-06 DIAGNOSIS — Z515 Encounter for palliative care: Secondary | ICD-10-CM | POA: Diagnosis not present

## 2022-01-06 DIAGNOSIS — E43 Unspecified severe protein-calorie malnutrition: Secondary | ICD-10-CM | POA: Diagnosis not present

## 2022-01-06 DIAGNOSIS — R1084 Generalized abdominal pain: Secondary | ICD-10-CM | POA: Diagnosis not present

## 2022-01-06 DIAGNOSIS — K521 Toxic gastroenteritis and colitis: Secondary | ICD-10-CM | POA: Diagnosis not present

## 2022-01-06 DIAGNOSIS — T380X5A Adverse effect of glucocorticoids and synthetic analogues, initial encounter: Secondary | ICD-10-CM | POA: Diagnosis not present

## 2022-01-06 DIAGNOSIS — R627 Adult failure to thrive: Secondary | ICD-10-CM | POA: Diagnosis not present

## 2022-01-06 DIAGNOSIS — R531 Weakness: Secondary | ICD-10-CM | POA: Diagnosis not present

## 2022-01-07 DIAGNOSIS — T380X5A Adverse effect of glucocorticoids and synthetic analogues, initial encounter: Secondary | ICD-10-CM | POA: Diagnosis not present

## 2022-01-07 DIAGNOSIS — R531 Weakness: Secondary | ICD-10-CM | POA: Diagnosis not present

## 2022-01-07 DIAGNOSIS — K521 Toxic gastroenteritis and colitis: Secondary | ICD-10-CM | POA: Diagnosis not present

## 2022-01-07 DIAGNOSIS — Z515 Encounter for palliative care: Secondary | ICD-10-CM | POA: Diagnosis not present

## 2022-01-07 DIAGNOSIS — C439 Malignant melanoma of skin, unspecified: Secondary | ICD-10-CM | POA: Diagnosis not present

## 2022-01-07 DIAGNOSIS — E43 Unspecified severe protein-calorie malnutrition: Secondary | ICD-10-CM | POA: Diagnosis not present

## 2022-01-07 DIAGNOSIS — G720 Drug-induced myopathy: Secondary | ICD-10-CM | POA: Diagnosis not present

## 2022-01-07 DIAGNOSIS — R1084 Generalized abdominal pain: Secondary | ICD-10-CM | POA: Diagnosis not present

## 2022-01-08 DIAGNOSIS — Z515 Encounter for palliative care: Secondary | ICD-10-CM | POA: Diagnosis not present

## 2022-01-08 DIAGNOSIS — E43 Unspecified severe protein-calorie malnutrition: Secondary | ICD-10-CM | POA: Diagnosis not present

## 2022-01-08 DIAGNOSIS — C439 Malignant melanoma of skin, unspecified: Secondary | ICD-10-CM | POA: Diagnosis not present

## 2022-01-08 DIAGNOSIS — T380X5A Adverse effect of glucocorticoids and synthetic analogues, initial encounter: Secondary | ICD-10-CM | POA: Diagnosis not present

## 2022-01-08 DIAGNOSIS — K521 Toxic gastroenteritis and colitis: Secondary | ICD-10-CM | POA: Diagnosis not present

## 2022-01-08 DIAGNOSIS — R1084 Generalized abdominal pain: Secondary | ICD-10-CM | POA: Diagnosis not present

## 2022-01-08 DIAGNOSIS — R531 Weakness: Secondary | ICD-10-CM | POA: Diagnosis not present

## 2022-01-08 DIAGNOSIS — G720 Drug-induced myopathy: Secondary | ICD-10-CM | POA: Diagnosis not present

## 2022-01-09 DIAGNOSIS — K6389 Other specified diseases of intestine: Secondary | ICD-10-CM | POA: Diagnosis not present

## 2022-01-09 DIAGNOSIS — K521 Toxic gastroenteritis and colitis: Secondary | ICD-10-CM | POA: Diagnosis not present

## 2022-01-09 DIAGNOSIS — E43 Unspecified severe protein-calorie malnutrition: Secondary | ICD-10-CM | POA: Diagnosis not present

## 2022-01-09 DIAGNOSIS — Z515 Encounter for palliative care: Secondary | ICD-10-CM | POA: Diagnosis not present

## 2022-01-09 DIAGNOSIS — T380X5A Adverse effect of glucocorticoids and synthetic analogues, initial encounter: Secondary | ICD-10-CM | POA: Diagnosis not present

## 2022-01-09 DIAGNOSIS — G720 Drug-induced myopathy: Secondary | ICD-10-CM | POA: Diagnosis not present

## 2022-01-09 DIAGNOSIS — R531 Weakness: Secondary | ICD-10-CM | POA: Diagnosis not present

## 2022-01-09 DIAGNOSIS — R1084 Generalized abdominal pain: Secondary | ICD-10-CM | POA: Diagnosis not present

## 2022-01-09 DIAGNOSIS — C439 Malignant melanoma of skin, unspecified: Secondary | ICD-10-CM | POA: Diagnosis not present

## 2022-01-10 DIAGNOSIS — K52832 Lymphocytic colitis: Secondary | ICD-10-CM | POA: Diagnosis not present

## 2022-01-10 DIAGNOSIS — E43 Unspecified severe protein-calorie malnutrition: Secondary | ICD-10-CM | POA: Diagnosis not present

## 2022-01-10 DIAGNOSIS — G720 Drug-induced myopathy: Secondary | ICD-10-CM | POA: Diagnosis not present

## 2022-01-10 DIAGNOSIS — R531 Weakness: Secondary | ICD-10-CM | POA: Diagnosis not present

## 2022-01-10 DIAGNOSIS — T380X5A Adverse effect of glucocorticoids and synthetic analogues, initial encounter: Secondary | ICD-10-CM | POA: Diagnosis not present

## 2022-01-10 DIAGNOSIS — R1084 Generalized abdominal pain: Secondary | ICD-10-CM | POA: Diagnosis not present

## 2022-01-10 DIAGNOSIS — K529 Noninfective gastroenteritis and colitis, unspecified: Secondary | ICD-10-CM | POA: Diagnosis not present

## 2022-01-10 DIAGNOSIS — Z515 Encounter for palliative care: Secondary | ICD-10-CM | POA: Diagnosis not present

## 2022-01-10 DIAGNOSIS — R197 Diarrhea, unspecified: Secondary | ICD-10-CM | POA: Diagnosis not present

## 2022-01-10 DIAGNOSIS — K6389 Other specified diseases of intestine: Secondary | ICD-10-CM | POA: Diagnosis not present

## 2022-01-10 DIAGNOSIS — K633 Ulcer of intestine: Secondary | ICD-10-CM | POA: Diagnosis not present

## 2022-01-10 DIAGNOSIS — C439 Malignant melanoma of skin, unspecified: Secondary | ICD-10-CM | POA: Diagnosis not present

## 2022-01-10 DIAGNOSIS — K521 Toxic gastroenteritis and colitis: Secondary | ICD-10-CM | POA: Diagnosis not present

## 2022-01-11 DIAGNOSIS — S0502XA Injury of conjunctiva and corneal abrasion without foreign body, left eye, initial encounter: Secondary | ICD-10-CM | POA: Diagnosis not present

## 2022-01-11 DIAGNOSIS — T380X5A Adverse effect of glucocorticoids and synthetic analogues, initial encounter: Secondary | ICD-10-CM | POA: Diagnosis not present

## 2022-01-11 DIAGNOSIS — X58XXXA Exposure to other specified factors, initial encounter: Secondary | ICD-10-CM | POA: Diagnosis not present

## 2022-01-11 DIAGNOSIS — G720 Drug-induced myopathy: Secondary | ICD-10-CM | POA: Diagnosis not present

## 2022-01-11 DIAGNOSIS — B309 Viral conjunctivitis, unspecified: Secondary | ICD-10-CM | POA: Diagnosis not present

## 2022-01-11 DIAGNOSIS — R1084 Generalized abdominal pain: Secondary | ICD-10-CM | POA: Diagnosis not present

## 2022-01-11 DIAGNOSIS — C439 Malignant melanoma of skin, unspecified: Secondary | ICD-10-CM | POA: Diagnosis not present

## 2022-01-11 DIAGNOSIS — K521 Toxic gastroenteritis and colitis: Secondary | ICD-10-CM | POA: Diagnosis not present

## 2022-01-11 DIAGNOSIS — E43 Unspecified severe protein-calorie malnutrition: Secondary | ICD-10-CM | POA: Diagnosis not present

## 2022-01-11 DIAGNOSIS — N390 Urinary tract infection, site not specified: Secondary | ICD-10-CM | POA: Diagnosis not present

## 2022-01-11 DIAGNOSIS — T83511A Infection and inflammatory reaction due to indwelling urethral catheter, initial encounter: Secondary | ICD-10-CM | POA: Diagnosis not present

## 2022-01-12 DIAGNOSIS — C439 Malignant melanoma of skin, unspecified: Secondary | ICD-10-CM | POA: Diagnosis not present

## 2022-01-12 DIAGNOSIS — S0502XA Injury of conjunctiva and corneal abrasion without foreign body, left eye, initial encounter: Secondary | ICD-10-CM | POA: Diagnosis not present

## 2022-01-12 DIAGNOSIS — G720 Drug-induced myopathy: Secondary | ICD-10-CM | POA: Diagnosis not present

## 2022-01-12 DIAGNOSIS — R1084 Generalized abdominal pain: Secondary | ICD-10-CM | POA: Diagnosis not present

## 2022-01-12 DIAGNOSIS — N390 Urinary tract infection, site not specified: Secondary | ICD-10-CM | POA: Diagnosis not present

## 2022-01-12 DIAGNOSIS — T83511A Infection and inflammatory reaction due to indwelling urethral catheter, initial encounter: Secondary | ICD-10-CM | POA: Diagnosis not present

## 2022-01-12 DIAGNOSIS — B309 Viral conjunctivitis, unspecified: Secondary | ICD-10-CM | POA: Diagnosis not present

## 2022-01-12 DIAGNOSIS — S0501XA Injury of conjunctiva and corneal abrasion without foreign body, right eye, initial encounter: Secondary | ICD-10-CM | POA: Diagnosis not present

## 2022-01-12 DIAGNOSIS — K521 Toxic gastroenteritis and colitis: Secondary | ICD-10-CM | POA: Diagnosis not present

## 2022-01-12 DIAGNOSIS — X58XXXA Exposure to other specified factors, initial encounter: Secondary | ICD-10-CM | POA: Diagnosis not present

## 2022-01-12 DIAGNOSIS — E43 Unspecified severe protein-calorie malnutrition: Secondary | ICD-10-CM | POA: Diagnosis not present

## 2022-01-12 DIAGNOSIS — T380X5A Adverse effect of glucocorticoids and synthetic analogues, initial encounter: Secondary | ICD-10-CM | POA: Diagnosis not present

## 2022-01-13 DIAGNOSIS — E43 Unspecified severe protein-calorie malnutrition: Secondary | ICD-10-CM | POA: Diagnosis not present

## 2022-01-13 DIAGNOSIS — N390 Urinary tract infection, site not specified: Secondary | ICD-10-CM | POA: Diagnosis not present

## 2022-01-13 DIAGNOSIS — C439 Malignant melanoma of skin, unspecified: Secondary | ICD-10-CM | POA: Diagnosis not present

## 2022-01-13 DIAGNOSIS — R1084 Generalized abdominal pain: Secondary | ICD-10-CM | POA: Diagnosis not present

## 2022-01-13 DIAGNOSIS — G720 Drug-induced myopathy: Secondary | ICD-10-CM | POA: Diagnosis not present

## 2022-01-13 DIAGNOSIS — X58XXXA Exposure to other specified factors, initial encounter: Secondary | ICD-10-CM | POA: Diagnosis not present

## 2022-01-13 DIAGNOSIS — T380X5A Adverse effect of glucocorticoids and synthetic analogues, initial encounter: Secondary | ICD-10-CM | POA: Diagnosis not present

## 2022-01-13 DIAGNOSIS — K521 Toxic gastroenteritis and colitis: Secondary | ICD-10-CM | POA: Diagnosis not present

## 2022-01-13 DIAGNOSIS — S0502XA Injury of conjunctiva and corneal abrasion without foreign body, left eye, initial encounter: Secondary | ICD-10-CM | POA: Diagnosis not present

## 2022-01-13 DIAGNOSIS — B309 Viral conjunctivitis, unspecified: Secondary | ICD-10-CM | POA: Diagnosis not present

## 2022-01-13 DIAGNOSIS — T83511A Infection and inflammatory reaction due to indwelling urethral catheter, initial encounter: Secondary | ICD-10-CM | POA: Diagnosis not present

## 2022-01-14 DIAGNOSIS — T83511A Infection and inflammatory reaction due to indwelling urethral catheter, initial encounter: Secondary | ICD-10-CM | POA: Diagnosis not present

## 2022-01-14 DIAGNOSIS — K521 Toxic gastroenteritis and colitis: Secondary | ICD-10-CM | POA: Diagnosis not present

## 2022-01-14 DIAGNOSIS — X58XXXA Exposure to other specified factors, initial encounter: Secondary | ICD-10-CM | POA: Diagnosis not present

## 2022-01-14 DIAGNOSIS — N390 Urinary tract infection, site not specified: Secondary | ICD-10-CM | POA: Diagnosis not present

## 2022-01-14 DIAGNOSIS — C439 Malignant melanoma of skin, unspecified: Secondary | ICD-10-CM | POA: Diagnosis not present

## 2022-01-14 DIAGNOSIS — E43 Unspecified severe protein-calorie malnutrition: Secondary | ICD-10-CM | POA: Diagnosis not present

## 2022-01-14 DIAGNOSIS — T380X5A Adverse effect of glucocorticoids and synthetic analogues, initial encounter: Secondary | ICD-10-CM | POA: Diagnosis not present

## 2022-01-14 DIAGNOSIS — R1084 Generalized abdominal pain: Secondary | ICD-10-CM | POA: Diagnosis not present

## 2022-01-14 DIAGNOSIS — B309 Viral conjunctivitis, unspecified: Secondary | ICD-10-CM | POA: Diagnosis not present

## 2022-01-14 DIAGNOSIS — S0502XA Injury of conjunctiva and corneal abrasion without foreign body, left eye, initial encounter: Secondary | ICD-10-CM | POA: Diagnosis not present

## 2022-01-14 DIAGNOSIS — G720 Drug-induced myopathy: Secondary | ICD-10-CM | POA: Diagnosis not present

## 2022-01-15 DIAGNOSIS — R1084 Generalized abdominal pain: Secondary | ICD-10-CM | POA: Diagnosis not present

## 2022-01-15 DIAGNOSIS — E43 Unspecified severe protein-calorie malnutrition: Secondary | ICD-10-CM | POA: Diagnosis not present

## 2022-01-15 DIAGNOSIS — X58XXXA Exposure to other specified factors, initial encounter: Secondary | ICD-10-CM | POA: Diagnosis not present

## 2022-01-15 DIAGNOSIS — B309 Viral conjunctivitis, unspecified: Secondary | ICD-10-CM | POA: Diagnosis not present

## 2022-01-15 DIAGNOSIS — N390 Urinary tract infection, site not specified: Secondary | ICD-10-CM | POA: Diagnosis not present

## 2022-01-15 DIAGNOSIS — T83511A Infection and inflammatory reaction due to indwelling urethral catheter, initial encounter: Secondary | ICD-10-CM | POA: Diagnosis not present

## 2022-01-15 DIAGNOSIS — T380X5A Adverse effect of glucocorticoids and synthetic analogues, initial encounter: Secondary | ICD-10-CM | POA: Diagnosis not present

## 2022-01-15 DIAGNOSIS — S0502XA Injury of conjunctiva and corneal abrasion without foreign body, left eye, initial encounter: Secondary | ICD-10-CM | POA: Diagnosis not present

## 2022-01-15 DIAGNOSIS — C439 Malignant melanoma of skin, unspecified: Secondary | ICD-10-CM | POA: Diagnosis not present

## 2022-01-15 DIAGNOSIS — K521 Toxic gastroenteritis and colitis: Secondary | ICD-10-CM | POA: Diagnosis not present

## 2022-01-15 DIAGNOSIS — G720 Drug-induced myopathy: Secondary | ICD-10-CM | POA: Diagnosis not present

## 2022-01-16 DIAGNOSIS — N390 Urinary tract infection, site not specified: Secondary | ICD-10-CM | POA: Diagnosis not present

## 2022-01-16 DIAGNOSIS — T83511A Infection and inflammatory reaction due to indwelling urethral catheter, initial encounter: Secondary | ICD-10-CM | POA: Diagnosis not present

## 2022-01-16 DIAGNOSIS — E43 Unspecified severe protein-calorie malnutrition: Secondary | ICD-10-CM | POA: Diagnosis not present

## 2022-01-16 DIAGNOSIS — R1084 Generalized abdominal pain: Secondary | ICD-10-CM | POA: Diagnosis not present

## 2022-01-16 DIAGNOSIS — K521 Toxic gastroenteritis and colitis: Secondary | ICD-10-CM | POA: Diagnosis not present

## 2022-01-16 DIAGNOSIS — X58XXXA Exposure to other specified factors, initial encounter: Secondary | ICD-10-CM | POA: Diagnosis not present

## 2022-01-16 DIAGNOSIS — T380X5A Adverse effect of glucocorticoids and synthetic analogues, initial encounter: Secondary | ICD-10-CM | POA: Diagnosis not present

## 2022-01-16 DIAGNOSIS — G720 Drug-induced myopathy: Secondary | ICD-10-CM | POA: Diagnosis not present

## 2022-01-16 DIAGNOSIS — S0502XA Injury of conjunctiva and corneal abrasion without foreign body, left eye, initial encounter: Secondary | ICD-10-CM | POA: Diagnosis not present

## 2022-01-16 DIAGNOSIS — C439 Malignant melanoma of skin, unspecified: Secondary | ICD-10-CM | POA: Diagnosis not present

## 2022-01-16 DIAGNOSIS — B309 Viral conjunctivitis, unspecified: Secondary | ICD-10-CM | POA: Diagnosis not present

## 2022-01-17 DIAGNOSIS — C439 Malignant melanoma of skin, unspecified: Secondary | ICD-10-CM | POA: Diagnosis not present

## 2022-01-17 DIAGNOSIS — S0502XA Injury of conjunctiva and corneal abrasion without foreign body, left eye, initial encounter: Secondary | ICD-10-CM | POA: Diagnosis not present

## 2022-01-17 DIAGNOSIS — B309 Viral conjunctivitis, unspecified: Secondary | ICD-10-CM | POA: Diagnosis not present

## 2022-01-17 DIAGNOSIS — T380X5A Adverse effect of glucocorticoids and synthetic analogues, initial encounter: Secondary | ICD-10-CM | POA: Diagnosis not present

## 2022-01-17 DIAGNOSIS — N390 Urinary tract infection, site not specified: Secondary | ICD-10-CM | POA: Diagnosis not present

## 2022-01-17 DIAGNOSIS — R1084 Generalized abdominal pain: Secondary | ICD-10-CM | POA: Diagnosis not present

## 2022-01-17 DIAGNOSIS — T83511A Infection and inflammatory reaction due to indwelling urethral catheter, initial encounter: Secondary | ICD-10-CM | POA: Diagnosis not present

## 2022-01-17 DIAGNOSIS — E43 Unspecified severe protein-calorie malnutrition: Secondary | ICD-10-CM | POA: Diagnosis not present

## 2022-01-17 DIAGNOSIS — X58XXXA Exposure to other specified factors, initial encounter: Secondary | ICD-10-CM | POA: Diagnosis not present

## 2022-01-17 DIAGNOSIS — K521 Toxic gastroenteritis and colitis: Secondary | ICD-10-CM | POA: Diagnosis not present

## 2022-01-17 DIAGNOSIS — G720 Drug-induced myopathy: Secondary | ICD-10-CM | POA: Diagnosis not present

## 2022-01-18 DIAGNOSIS — B370 Candidal stomatitis: Secondary | ICD-10-CM | POA: Diagnosis not present

## 2022-01-18 DIAGNOSIS — Z515 Encounter for palliative care: Secondary | ICD-10-CM | POA: Diagnosis not present

## 2022-01-18 DIAGNOSIS — Z789 Other specified health status: Secondary | ICD-10-CM | POA: Diagnosis not present

## 2022-01-18 DIAGNOSIS — E43 Unspecified severe protein-calorie malnutrition: Secondary | ICD-10-CM | POA: Diagnosis not present

## 2022-01-18 DIAGNOSIS — K521 Toxic gastroenteritis and colitis: Secondary | ICD-10-CM | POA: Diagnosis not present

## 2022-01-19 DIAGNOSIS — Z789 Other specified health status: Secondary | ICD-10-CM | POA: Diagnosis not present

## 2022-01-19 DIAGNOSIS — Z515 Encounter for palliative care: Secondary | ICD-10-CM | POA: Diagnosis not present

## 2022-01-19 DIAGNOSIS — E43 Unspecified severe protein-calorie malnutrition: Secondary | ICD-10-CM | POA: Diagnosis not present

## 2022-01-19 DIAGNOSIS — K521 Toxic gastroenteritis and colitis: Secondary | ICD-10-CM | POA: Diagnosis not present

## 2022-01-19 DIAGNOSIS — B370 Candidal stomatitis: Secondary | ICD-10-CM | POA: Diagnosis not present

## 2022-01-20 DIAGNOSIS — Z789 Other specified health status: Secondary | ICD-10-CM | POA: Diagnosis not present

## 2022-01-20 DIAGNOSIS — E43 Unspecified severe protein-calorie malnutrition: Secondary | ICD-10-CM | POA: Diagnosis not present

## 2022-01-20 DIAGNOSIS — B370 Candidal stomatitis: Secondary | ICD-10-CM | POA: Diagnosis not present

## 2022-01-20 DIAGNOSIS — R140 Abdominal distension (gaseous): Secondary | ICD-10-CM | POA: Diagnosis not present

## 2022-01-20 DIAGNOSIS — Z515 Encounter for palliative care: Secondary | ICD-10-CM | POA: Diagnosis not present

## 2022-01-20 DIAGNOSIS — K521 Toxic gastroenteritis and colitis: Secondary | ICD-10-CM | POA: Diagnosis not present

## 2022-01-20 DIAGNOSIS — R109 Unspecified abdominal pain: Secondary | ICD-10-CM | POA: Diagnosis not present

## 2022-02-03 DIAGNOSIS — F902 Attention-deficit hyperactivity disorder, combined type: Secondary | ICD-10-CM | POA: Diagnosis not present

## 2022-02-03 DIAGNOSIS — S32000D Wedge compression fracture of unspecified lumbar vertebra, subsequent encounter for fracture with routine healing: Secondary | ICD-10-CM | POA: Diagnosis not present

## 2022-02-03 DIAGNOSIS — R488 Other symbolic dysfunctions: Secondary | ICD-10-CM | POA: Diagnosis not present

## 2022-02-03 DIAGNOSIS — K521 Toxic gastroenteritis and colitis: Secondary | ICD-10-CM | POA: Diagnosis not present

## 2022-02-03 DIAGNOSIS — C7970 Secondary malignant neoplasm of unspecified adrenal gland: Secondary | ICD-10-CM | POA: Diagnosis not present

## 2022-02-03 DIAGNOSIS — T7840XA Allergy, unspecified, initial encounter: Secondary | ICD-10-CM | POA: Diagnosis not present

## 2022-02-03 DIAGNOSIS — I82452 Acute embolism and thrombosis of left peroneal vein: Secondary | ICD-10-CM | POA: Diagnosis not present

## 2022-02-03 DIAGNOSIS — N390 Urinary tract infection, site not specified: Secondary | ICD-10-CM | POA: Diagnosis not present

## 2022-02-03 DIAGNOSIS — I480 Paroxysmal atrial fibrillation: Secondary | ICD-10-CM | POA: Diagnosis not present

## 2022-02-03 DIAGNOSIS — M6281 Muscle weakness (generalized): Secondary | ICD-10-CM | POA: Diagnosis not present

## 2022-02-03 DIAGNOSIS — C7989 Secondary malignant neoplasm of other specified sites: Secondary | ICD-10-CM | POA: Diagnosis not present

## 2022-02-03 DIAGNOSIS — G4700 Insomnia, unspecified: Secondary | ICD-10-CM | POA: Diagnosis not present

## 2022-02-03 DIAGNOSIS — E785 Hyperlipidemia, unspecified: Secondary | ICD-10-CM | POA: Diagnosis not present

## 2022-02-03 DIAGNOSIS — Z90710 Acquired absence of both cervix and uterus: Secondary | ICD-10-CM | POA: Diagnosis not present

## 2022-02-03 DIAGNOSIS — J45909 Unspecified asthma, uncomplicated: Secondary | ICD-10-CM | POA: Diagnosis not present

## 2022-02-03 DIAGNOSIS — Z7401 Bed confinement status: Secondary | ICD-10-CM | POA: Diagnosis not present

## 2022-02-03 DIAGNOSIS — I2699 Other pulmonary embolism without acute cor pulmonale: Secondary | ICD-10-CM | POA: Diagnosis not present

## 2022-02-03 DIAGNOSIS — C7951 Secondary malignant neoplasm of bone: Secondary | ICD-10-CM | POA: Diagnosis not present

## 2022-02-03 DIAGNOSIS — Z86718 Personal history of other venous thrombosis and embolism: Secondary | ICD-10-CM | POA: Diagnosis not present

## 2022-02-03 DIAGNOSIS — I10 Essential (primary) hypertension: Secondary | ICD-10-CM | POA: Diagnosis not present

## 2022-02-03 DIAGNOSIS — I4891 Unspecified atrial fibrillation: Secondary | ICD-10-CM | POA: Diagnosis not present

## 2022-02-03 DIAGNOSIS — Z20822 Contact with and (suspected) exposure to covid-19: Secondary | ICD-10-CM | POA: Diagnosis not present

## 2022-02-03 DIAGNOSIS — R52 Pain, unspecified: Secondary | ICD-10-CM | POA: Diagnosis not present

## 2022-02-03 DIAGNOSIS — J449 Chronic obstructive pulmonary disease, unspecified: Secondary | ICD-10-CM | POA: Diagnosis not present

## 2022-02-03 DIAGNOSIS — R197 Diarrhea, unspecified: Secondary | ICD-10-CM | POA: Diagnosis not present

## 2022-02-03 DIAGNOSIS — M8458XA Pathological fracture in neoplastic disease, other specified site, initial encounter for fracture: Secondary | ICD-10-CM | POA: Diagnosis not present

## 2022-02-03 DIAGNOSIS — R0602 Shortness of breath: Secondary | ICD-10-CM | POA: Diagnosis not present

## 2022-02-03 DIAGNOSIS — R918 Other nonspecific abnormal finding of lung field: Secondary | ICD-10-CM | POA: Diagnosis not present

## 2022-02-03 DIAGNOSIS — Z515 Encounter for palliative care: Secondary | ICD-10-CM | POA: Diagnosis not present

## 2022-02-03 DIAGNOSIS — K529 Noninfective gastroenteritis and colitis, unspecified: Secondary | ICD-10-CM | POA: Diagnosis not present

## 2022-02-03 DIAGNOSIS — C7931 Secondary malignant neoplasm of brain: Secondary | ICD-10-CM | POA: Diagnosis not present

## 2022-02-03 DIAGNOSIS — M109 Gout, unspecified: Secondary | ICD-10-CM | POA: Diagnosis not present

## 2022-02-03 DIAGNOSIS — E860 Dehydration: Secondary | ICD-10-CM | POA: Diagnosis not present

## 2022-02-03 DIAGNOSIS — I82412 Acute embolism and thrombosis of left femoral vein: Secondary | ICD-10-CM | POA: Diagnosis not present

## 2022-02-03 DIAGNOSIS — E43 Unspecified severe protein-calorie malnutrition: Secondary | ICD-10-CM | POA: Diagnosis not present

## 2022-02-03 DIAGNOSIS — C439 Malignant melanoma of skin, unspecified: Secondary | ICD-10-CM | POA: Diagnosis not present

## 2022-02-03 DIAGNOSIS — I82432 Acute embolism and thrombosis of left popliteal vein: Secondary | ICD-10-CM | POA: Diagnosis not present

## 2022-02-03 DIAGNOSIS — S32010D Wedge compression fracture of first lumbar vertebra, subsequent encounter for fracture with routine healing: Secondary | ICD-10-CM | POA: Diagnosis not present

## 2022-02-03 DIAGNOSIS — R5383 Other fatigue: Secondary | ICD-10-CM | POA: Diagnosis not present

## 2022-02-03 DIAGNOSIS — I82451 Acute embolism and thrombosis of right peroneal vein: Secondary | ICD-10-CM | POA: Diagnosis not present

## 2022-02-03 DIAGNOSIS — Z7901 Long term (current) use of anticoagulants: Secondary | ICD-10-CM | POA: Diagnosis not present

## 2022-02-03 DIAGNOSIS — I2609 Other pulmonary embolism with acute cor pulmonale: Secondary | ICD-10-CM | POA: Diagnosis not present

## 2022-02-03 DIAGNOSIS — I82409 Acute embolism and thrombosis of unspecified deep veins of unspecified lower extremity: Secondary | ICD-10-CM | POA: Diagnosis not present

## 2022-02-03 DIAGNOSIS — I82402 Acute embolism and thrombosis of unspecified deep veins of left lower extremity: Secondary | ICD-10-CM | POA: Diagnosis not present

## 2022-02-03 DIAGNOSIS — R2681 Unsteadiness on feet: Secondary | ICD-10-CM | POA: Diagnosis not present

## 2022-02-03 DIAGNOSIS — C7800 Secondary malignant neoplasm of unspecified lung: Secondary | ICD-10-CM | POA: Diagnosis not present

## 2022-02-03 DIAGNOSIS — R498 Other voice and resonance disorders: Secondary | ICD-10-CM | POA: Diagnosis not present

## 2022-02-03 DIAGNOSIS — B379 Candidiasis, unspecified: Secondary | ICD-10-CM | POA: Diagnosis not present

## 2022-02-03 DIAGNOSIS — I82441 Acute embolism and thrombosis of right tibial vein: Secondary | ICD-10-CM | POA: Diagnosis not present

## 2022-02-03 DIAGNOSIS — G893 Neoplasm related pain (acute) (chronic): Secondary | ICD-10-CM | POA: Diagnosis not present

## 2022-02-03 DIAGNOSIS — R2242 Localized swelling, mass and lump, left lower limb: Secondary | ICD-10-CM | POA: Diagnosis not present

## 2022-02-03 DIAGNOSIS — I824Z2 Acute embolism and thrombosis of unspecified deep veins of left distal lower extremity: Secondary | ICD-10-CM | POA: Diagnosis not present

## 2022-02-03 DIAGNOSIS — Z736 Limitation of activities due to disability: Secondary | ICD-10-CM | POA: Diagnosis not present

## 2022-02-03 DIAGNOSIS — R627 Adult failure to thrive: Secondary | ICD-10-CM | POA: Diagnosis not present

## 2022-02-03 DIAGNOSIS — R1312 Dysphagia, oropharyngeal phase: Secondary | ICD-10-CM | POA: Diagnosis not present

## 2022-02-03 DIAGNOSIS — B370 Candidal stomatitis: Secondary | ICD-10-CM | POA: Diagnosis not present

## 2022-02-03 DIAGNOSIS — M7989 Other specified soft tissue disorders: Secondary | ICD-10-CM | POA: Diagnosis not present

## 2022-02-03 DIAGNOSIS — A0472 Enterocolitis due to Clostridium difficile, not specified as recurrent: Secondary | ICD-10-CM | POA: Diagnosis not present

## 2022-02-03 DIAGNOSIS — K219 Gastro-esophageal reflux disease without esophagitis: Secondary | ICD-10-CM | POA: Diagnosis not present

## 2022-02-03 DIAGNOSIS — R609 Edema, unspecified: Secondary | ICD-10-CM | POA: Diagnosis not present

## 2022-02-03 DIAGNOSIS — C7889 Secondary malignant neoplasm of other digestive organs: Secondary | ICD-10-CM | POA: Diagnosis not present

## 2022-02-03 DIAGNOSIS — M5489 Other dorsalgia: Secondary | ICD-10-CM | POA: Diagnosis not present

## 2022-02-03 DIAGNOSIS — D649 Anemia, unspecified: Secondary | ICD-10-CM | POA: Diagnosis not present

## 2022-02-03 DIAGNOSIS — M6259 Muscle wasting and atrophy, not elsewhere classified, multiple sites: Secondary | ICD-10-CM | POA: Diagnosis not present

## 2022-02-04 DIAGNOSIS — K521 Toxic gastroenteritis and colitis: Secondary | ICD-10-CM | POA: Diagnosis not present

## 2022-02-04 DIAGNOSIS — R627 Adult failure to thrive: Secondary | ICD-10-CM | POA: Diagnosis not present

## 2022-02-04 DIAGNOSIS — M6281 Muscle weakness (generalized): Secondary | ICD-10-CM | POA: Diagnosis not present

## 2022-02-04 DIAGNOSIS — N390 Urinary tract infection, site not specified: Secondary | ICD-10-CM | POA: Diagnosis not present

## 2022-02-05 ENCOUNTER — Other Ambulatory Visit: Payer: Self-pay | Admitting: Family Medicine

## 2022-02-08 DIAGNOSIS — M5489 Other dorsalgia: Secondary | ICD-10-CM | POA: Diagnosis not present

## 2022-02-08 DIAGNOSIS — C439 Malignant melanoma of skin, unspecified: Secondary | ICD-10-CM | POA: Diagnosis not present

## 2022-02-08 DIAGNOSIS — K529 Noninfective gastroenteritis and colitis, unspecified: Secondary | ICD-10-CM | POA: Diagnosis not present

## 2022-02-08 DIAGNOSIS — M6281 Muscle weakness (generalized): Secondary | ICD-10-CM | POA: Diagnosis not present

## 2022-02-09 DIAGNOSIS — Z86718 Personal history of other venous thrombosis and embolism: Secondary | ICD-10-CM | POA: Diagnosis not present

## 2022-02-09 DIAGNOSIS — M6281 Muscle weakness (generalized): Secondary | ICD-10-CM | POA: Diagnosis not present

## 2022-02-09 DIAGNOSIS — I4891 Unspecified atrial fibrillation: Secondary | ICD-10-CM | POA: Diagnosis not present

## 2022-02-09 DIAGNOSIS — J449 Chronic obstructive pulmonary disease, unspecified: Secondary | ICD-10-CM | POA: Diagnosis not present

## 2022-02-10 DIAGNOSIS — I480 Paroxysmal atrial fibrillation: Secondary | ICD-10-CM | POA: Diagnosis not present

## 2022-02-10 DIAGNOSIS — M6281 Muscle weakness (generalized): Secondary | ICD-10-CM | POA: Diagnosis not present

## 2022-02-10 DIAGNOSIS — K219 Gastro-esophageal reflux disease without esophagitis: Secondary | ICD-10-CM | POA: Diagnosis not present

## 2022-02-10 DIAGNOSIS — M5489 Other dorsalgia: Secondary | ICD-10-CM | POA: Diagnosis not present

## 2022-02-10 DIAGNOSIS — I2609 Other pulmonary embolism with acute cor pulmonale: Secondary | ICD-10-CM | POA: Diagnosis not present

## 2022-02-11 DIAGNOSIS — S32000D Wedge compression fracture of unspecified lumbar vertebra, subsequent encounter for fracture with routine healing: Secondary | ICD-10-CM | POA: Diagnosis not present

## 2022-02-11 DIAGNOSIS — K521 Toxic gastroenteritis and colitis: Secondary | ICD-10-CM | POA: Diagnosis not present

## 2022-02-14 DIAGNOSIS — K521 Toxic gastroenteritis and colitis: Secondary | ICD-10-CM | POA: Diagnosis not present

## 2022-02-14 DIAGNOSIS — S32000D Wedge compression fracture of unspecified lumbar vertebra, subsequent encounter for fracture with routine healing: Secondary | ICD-10-CM | POA: Diagnosis not present

## 2022-02-17 DIAGNOSIS — S32000D Wedge compression fracture of unspecified lumbar vertebra, subsequent encounter for fracture with routine healing: Secondary | ICD-10-CM | POA: Diagnosis not present

## 2022-02-17 DIAGNOSIS — M6281 Muscle weakness (generalized): Secondary | ICD-10-CM | POA: Diagnosis not present

## 2022-02-17 DIAGNOSIS — K521 Toxic gastroenteritis and colitis: Secondary | ICD-10-CM | POA: Diagnosis not present

## 2022-02-21 DIAGNOSIS — I82402 Acute embolism and thrombosis of unspecified deep veins of left lower extremity: Secondary | ICD-10-CM | POA: Diagnosis not present

## 2022-02-21 DIAGNOSIS — K521 Toxic gastroenteritis and colitis: Secondary | ICD-10-CM | POA: Diagnosis not present

## 2022-02-21 DIAGNOSIS — S32000D Wedge compression fracture of unspecified lumbar vertebra, subsequent encounter for fracture with routine healing: Secondary | ICD-10-CM | POA: Diagnosis not present

## 2022-02-21 DIAGNOSIS — B379 Candidiasis, unspecified: Secondary | ICD-10-CM | POA: Diagnosis not present

## 2022-02-22 DIAGNOSIS — C7931 Secondary malignant neoplasm of brain: Secondary | ICD-10-CM | POA: Diagnosis not present

## 2022-02-22 DIAGNOSIS — C7800 Secondary malignant neoplasm of unspecified lung: Secondary | ICD-10-CM | POA: Diagnosis not present

## 2022-02-22 DIAGNOSIS — Z7401 Bed confinement status: Secondary | ICD-10-CM | POA: Diagnosis not present

## 2022-02-22 DIAGNOSIS — E785 Hyperlipidemia, unspecified: Secondary | ICD-10-CM | POA: Diagnosis not present

## 2022-02-22 DIAGNOSIS — M8458XA Pathological fracture in neoplastic disease, other specified site, initial encounter for fracture: Secondary | ICD-10-CM | POA: Diagnosis not present

## 2022-02-22 DIAGNOSIS — I2699 Other pulmonary embolism without acute cor pulmonale: Secondary | ICD-10-CM | POA: Diagnosis not present

## 2022-02-22 DIAGNOSIS — I824Z2 Acute embolism and thrombosis of unspecified deep veins of left distal lower extremity: Secondary | ICD-10-CM | POA: Diagnosis not present

## 2022-02-22 DIAGNOSIS — Z7901 Long term (current) use of anticoagulants: Secondary | ICD-10-CM | POA: Diagnosis not present

## 2022-02-22 DIAGNOSIS — I82412 Acute embolism and thrombosis of left femoral vein: Secondary | ICD-10-CM | POA: Diagnosis not present

## 2022-02-22 DIAGNOSIS — K521 Toxic gastroenteritis and colitis: Secondary | ICD-10-CM | POA: Diagnosis not present

## 2022-02-22 DIAGNOSIS — R197 Diarrhea, unspecified: Secondary | ICD-10-CM | POA: Diagnosis not present

## 2022-02-22 DIAGNOSIS — G893 Neoplasm related pain (acute) (chronic): Secondary | ICD-10-CM | POA: Diagnosis not present

## 2022-02-22 DIAGNOSIS — Z515 Encounter for palliative care: Secondary | ICD-10-CM | POA: Diagnosis not present

## 2022-02-22 DIAGNOSIS — R5383 Other fatigue: Secondary | ICD-10-CM | POA: Diagnosis not present

## 2022-02-22 DIAGNOSIS — B370 Candidal stomatitis: Secondary | ICD-10-CM | POA: Diagnosis not present

## 2022-02-22 DIAGNOSIS — M109 Gout, unspecified: Secondary | ICD-10-CM | POA: Diagnosis not present

## 2022-02-22 DIAGNOSIS — R918 Other nonspecific abnormal finding of lung field: Secondary | ICD-10-CM | POA: Diagnosis not present

## 2022-02-22 DIAGNOSIS — G4700 Insomnia, unspecified: Secondary | ICD-10-CM | POA: Diagnosis not present

## 2022-02-22 DIAGNOSIS — E43 Unspecified severe protein-calorie malnutrition: Secondary | ICD-10-CM | POA: Diagnosis not present

## 2022-02-22 DIAGNOSIS — C7970 Secondary malignant neoplasm of unspecified adrenal gland: Secondary | ICD-10-CM | POA: Diagnosis not present

## 2022-02-22 DIAGNOSIS — C439 Malignant melanoma of skin, unspecified: Secondary | ICD-10-CM | POA: Diagnosis not present

## 2022-02-22 DIAGNOSIS — M7989 Other specified soft tissue disorders: Secondary | ICD-10-CM | POA: Diagnosis not present

## 2022-02-22 DIAGNOSIS — S32010D Wedge compression fracture of first lumbar vertebra, subsequent encounter for fracture with routine healing: Secondary | ICD-10-CM | POA: Diagnosis not present

## 2022-02-22 DIAGNOSIS — M6259 Muscle wasting and atrophy, not elsewhere classified, multiple sites: Secondary | ICD-10-CM | POA: Diagnosis not present

## 2022-02-22 DIAGNOSIS — T7840XA Allergy, unspecified, initial encounter: Secondary | ICD-10-CM | POA: Diagnosis not present

## 2022-02-22 DIAGNOSIS — R0602 Shortness of breath: Secondary | ICD-10-CM | POA: Diagnosis not present

## 2022-02-22 DIAGNOSIS — D649 Anemia, unspecified: Secondary | ICD-10-CM | POA: Diagnosis not present

## 2022-02-22 DIAGNOSIS — S32000D Wedge compression fracture of unspecified lumbar vertebra, subsequent encounter for fracture with routine healing: Secondary | ICD-10-CM | POA: Diagnosis not present

## 2022-02-22 DIAGNOSIS — I82441 Acute embolism and thrombosis of right tibial vein: Secondary | ICD-10-CM | POA: Diagnosis not present

## 2022-02-22 DIAGNOSIS — K529 Noninfective gastroenteritis and colitis, unspecified: Secondary | ICD-10-CM | POA: Diagnosis not present

## 2022-02-22 DIAGNOSIS — F902 Attention-deficit hyperactivity disorder, combined type: Secondary | ICD-10-CM | POA: Diagnosis not present

## 2022-02-22 DIAGNOSIS — C7889 Secondary malignant neoplasm of other digestive organs: Secondary | ICD-10-CM | POA: Diagnosis not present

## 2022-02-22 DIAGNOSIS — R2681 Unsteadiness on feet: Secondary | ICD-10-CM | POA: Diagnosis not present

## 2022-02-22 DIAGNOSIS — A0472 Enterocolitis due to Clostridium difficile, not specified as recurrent: Secondary | ICD-10-CM | POA: Diagnosis not present

## 2022-02-22 DIAGNOSIS — I480 Paroxysmal atrial fibrillation: Secondary | ICD-10-CM | POA: Diagnosis not present

## 2022-02-22 DIAGNOSIS — Z90710 Acquired absence of both cervix and uterus: Secondary | ICD-10-CM | POA: Diagnosis not present

## 2022-02-22 DIAGNOSIS — C7951 Secondary malignant neoplasm of bone: Secondary | ICD-10-CM | POA: Diagnosis not present

## 2022-02-22 DIAGNOSIS — E860 Dehydration: Secondary | ICD-10-CM | POA: Diagnosis not present

## 2022-02-22 DIAGNOSIS — I82409 Acute embolism and thrombosis of unspecified deep veins of unspecified lower extremity: Secondary | ICD-10-CM | POA: Diagnosis not present

## 2022-02-22 DIAGNOSIS — I82402 Acute embolism and thrombosis of unspecified deep veins of left lower extremity: Secondary | ICD-10-CM | POA: Diagnosis not present

## 2022-02-22 DIAGNOSIS — C801 Malignant (primary) neoplasm, unspecified: Secondary | ICD-10-CM | POA: Diagnosis not present

## 2022-02-22 DIAGNOSIS — I82451 Acute embolism and thrombosis of right peroneal vein: Secondary | ICD-10-CM | POA: Diagnosis not present

## 2022-02-22 DIAGNOSIS — R609 Edema, unspecified: Secondary | ICD-10-CM | POA: Diagnosis not present

## 2022-02-22 DIAGNOSIS — Z20822 Contact with and (suspected) exposure to covid-19: Secondary | ICD-10-CM | POA: Diagnosis not present

## 2022-02-22 DIAGNOSIS — J45909 Unspecified asthma, uncomplicated: Secondary | ICD-10-CM | POA: Diagnosis not present

## 2022-02-22 DIAGNOSIS — I10 Essential (primary) hypertension: Secondary | ICD-10-CM | POA: Diagnosis not present

## 2022-02-22 DIAGNOSIS — J449 Chronic obstructive pulmonary disease, unspecified: Secondary | ICD-10-CM | POA: Diagnosis not present

## 2022-02-22 DIAGNOSIS — Z736 Limitation of activities due to disability: Secondary | ICD-10-CM | POA: Diagnosis not present

## 2022-02-22 DIAGNOSIS — I2692 Saddle embolus of pulmonary artery without acute cor pulmonale: Secondary | ICD-10-CM | POA: Diagnosis not present

## 2022-02-24 ENCOUNTER — Other Ambulatory Visit: Payer: Medicare Other

## 2022-02-24 ENCOUNTER — Telehealth: Payer: Self-pay

## 2022-02-24 DIAGNOSIS — J449 Chronic obstructive pulmonary disease, unspecified: Secondary | ICD-10-CM | POA: Diagnosis not present

## 2022-02-24 DIAGNOSIS — R609 Edema, unspecified: Secondary | ICD-10-CM | POA: Diagnosis not present

## 2022-02-24 DIAGNOSIS — I480 Paroxysmal atrial fibrillation: Secondary | ICD-10-CM | POA: Diagnosis not present

## 2022-02-24 NOTE — Telephone Encounter (Signed)
[place] Night - Client Nonclinical Telephone Record  [facility] Client [facility] Night - Client Client Site [place] Provider Loura Pardon - MD Contact Type Call Who Is Calling Patient / Member / Family / Caregiver Caller Name [place] Phone Number [phone] Patient Name Andrea Peters Patient DOB Jan 29, 1946 Call Type Message Only Information Provided Reason for Call Request to [facility] Appointment Initial Comment Caller states that she would like to cancel appointments. Patient request to speak to RN No Additional Comment Office hours office provided. 02/24/2022 at 830 am and 03/03/2022 at 930 am. Declined nurse triage. Disp. Time Disposition Final User 02/23/2022 5:47:32 PM General Information Provided Yes Lynne Logan, Amy Call Closed By: Sherlynn Stalls Transaction Date/Time: 02/23/2022 5:44:00 PM (ET

## 2022-02-25 DIAGNOSIS — J45909 Unspecified asthma, uncomplicated: Secondary | ICD-10-CM | POA: Diagnosis not present

## 2022-02-25 DIAGNOSIS — J449 Chronic obstructive pulmonary disease, unspecified: Secondary | ICD-10-CM | POA: Diagnosis not present

## 2022-02-25 DIAGNOSIS — I480 Paroxysmal atrial fibrillation: Secondary | ICD-10-CM | POA: Diagnosis not present

## 2022-02-25 DIAGNOSIS — E43 Unspecified severe protein-calorie malnutrition: Secondary | ICD-10-CM | POA: Diagnosis not present

## 2022-02-25 DIAGNOSIS — I2699 Other pulmonary embolism without acute cor pulmonale: Secondary | ICD-10-CM | POA: Diagnosis not present

## 2022-02-25 DIAGNOSIS — R918 Other nonspecific abnormal finding of lung field: Secondary | ICD-10-CM | POA: Diagnosis not present

## 2022-02-25 DIAGNOSIS — C439 Malignant melanoma of skin, unspecified: Secondary | ICD-10-CM | POA: Diagnosis not present

## 2022-02-25 DIAGNOSIS — M6259 Muscle wasting and atrophy, not elsewhere classified, multiple sites: Secondary | ICD-10-CM | POA: Diagnosis not present

## 2022-02-25 DIAGNOSIS — I82402 Acute embolism and thrombosis of unspecified deep veins of left lower extremity: Secondary | ICD-10-CM | POA: Diagnosis not present

## 2022-02-25 DIAGNOSIS — I10 Essential (primary) hypertension: Secondary | ICD-10-CM | POA: Diagnosis not present

## 2022-02-25 DIAGNOSIS — S32000D Wedge compression fracture of unspecified lumbar vertebra, subsequent encounter for fracture with routine healing: Secondary | ICD-10-CM | POA: Diagnosis not present

## 2022-02-25 DIAGNOSIS — Z515 Encounter for palliative care: Secondary | ICD-10-CM | POA: Diagnosis not present

## 2022-02-25 DIAGNOSIS — E860 Dehydration: Secondary | ICD-10-CM | POA: Diagnosis not present

## 2022-02-25 DIAGNOSIS — R197 Diarrhea, unspecified: Secondary | ICD-10-CM | POA: Diagnosis not present

## 2022-02-25 DIAGNOSIS — R2681 Unsteadiness on feet: Secondary | ICD-10-CM | POA: Diagnosis not present

## 2022-02-25 DIAGNOSIS — K529 Noninfective gastroenteritis and colitis, unspecified: Secondary | ICD-10-CM | POA: Diagnosis not present

## 2022-02-25 DIAGNOSIS — F902 Attention-deficit hyperactivity disorder, combined type: Secondary | ICD-10-CM | POA: Diagnosis not present

## 2022-02-25 DIAGNOSIS — G4700 Insomnia, unspecified: Secondary | ICD-10-CM | POA: Diagnosis not present

## 2022-02-25 DIAGNOSIS — M7989 Other specified soft tissue disorders: Secondary | ICD-10-CM | POA: Diagnosis not present

## 2022-02-25 DIAGNOSIS — Z7401 Bed confinement status: Secondary | ICD-10-CM | POA: Diagnosis not present

## 2022-02-25 DIAGNOSIS — C801 Malignant (primary) neoplasm, unspecified: Secondary | ICD-10-CM | POA: Diagnosis not present

## 2022-02-25 DIAGNOSIS — Z736 Limitation of activities due to disability: Secondary | ICD-10-CM | POA: Diagnosis not present

## 2022-02-25 DIAGNOSIS — D649 Anemia, unspecified: Secondary | ICD-10-CM | POA: Diagnosis not present

## 2022-02-25 DIAGNOSIS — M109 Gout, unspecified: Secondary | ICD-10-CM | POA: Diagnosis not present

## 2022-02-25 DIAGNOSIS — A0472 Enterocolitis due to Clostridium difficile, not specified as recurrent: Secondary | ICD-10-CM | POA: Diagnosis not present

## 2022-02-25 DIAGNOSIS — R609 Edema, unspecified: Secondary | ICD-10-CM | POA: Diagnosis not present

## 2022-02-25 DIAGNOSIS — S32010D Wedge compression fracture of first lumbar vertebra, subsequent encounter for fracture with routine healing: Secondary | ICD-10-CM | POA: Diagnosis not present

## 2022-02-25 DIAGNOSIS — I824Z2 Acute embolism and thrombosis of unspecified deep veins of left distal lower extremity: Secondary | ICD-10-CM | POA: Diagnosis not present

## 2022-02-25 DIAGNOSIS — M6281 Muscle weakness (generalized): Secondary | ICD-10-CM | POA: Diagnosis not present

## 2022-02-25 DIAGNOSIS — R5383 Other fatigue: Secondary | ICD-10-CM | POA: Diagnosis not present

## 2022-02-26 DIAGNOSIS — C439 Malignant melanoma of skin, unspecified: Secondary | ICD-10-CM | POA: Diagnosis not present

## 2022-02-26 DIAGNOSIS — I82402 Acute embolism and thrombosis of unspecified deep veins of left lower extremity: Secondary | ICD-10-CM | POA: Diagnosis not present

## 2022-02-26 DIAGNOSIS — I2699 Other pulmonary embolism without acute cor pulmonale: Secondary | ICD-10-CM | POA: Diagnosis not present

## 2022-02-26 DIAGNOSIS — R918 Other nonspecific abnormal finding of lung field: Secondary | ICD-10-CM | POA: Diagnosis not present

## 2022-02-26 DIAGNOSIS — M6281 Muscle weakness (generalized): Secondary | ICD-10-CM | POA: Diagnosis not present

## 2022-02-28 ENCOUNTER — Telehealth: Payer: Self-pay

## 2022-02-28 ENCOUNTER — Ambulatory Visit: Payer: Self-pay

## 2022-03-01 ENCOUNTER — Telehealth: Payer: Self-pay | Admitting: Family Medicine

## 2022-03-01 DIAGNOSIS — I480 Paroxysmal atrial fibrillation: Secondary | ICD-10-CM | POA: Diagnosis not present

## 2022-03-01 DIAGNOSIS — R609 Edema, unspecified: Secondary | ICD-10-CM | POA: Diagnosis not present

## 2022-03-01 DIAGNOSIS — J449 Chronic obstructive pulmonary disease, unspecified: Secondary | ICD-10-CM | POA: Diagnosis not present

## 2022-03-02 DIAGNOSIS — A0472 Enterocolitis due to Clostridium difficile, not specified as recurrent: Secondary | ICD-10-CM | POA: Diagnosis not present

## 2022-03-02 DIAGNOSIS — I2699 Other pulmonary embolism without acute cor pulmonale: Secondary | ICD-10-CM | POA: Diagnosis not present

## 2022-03-02 DIAGNOSIS — I82402 Acute embolism and thrombosis of unspecified deep veins of left lower extremity: Secondary | ICD-10-CM | POA: Diagnosis not present

## 2022-03-02 DIAGNOSIS — S32000D Wedge compression fracture of unspecified lumbar vertebra, subsequent encounter for fracture with routine healing: Secondary | ICD-10-CM | POA: Diagnosis not present

## 2022-03-03 ENCOUNTER — Encounter: Payer: Medicare Other | Admitting: Family Medicine

## 2022-03-04 ENCOUNTER — Telehealth: Payer: Self-pay

## 2022-03-04 DIAGNOSIS — I82402 Acute embolism and thrombosis of unspecified deep veins of left lower extremity: Secondary | ICD-10-CM | POA: Diagnosis not present

## 2022-03-04 DIAGNOSIS — M6281 Muscle weakness (generalized): Secondary | ICD-10-CM | POA: Diagnosis not present

## 2022-03-04 DIAGNOSIS — I2699 Other pulmonary embolism without acute cor pulmonale: Secondary | ICD-10-CM | POA: Diagnosis not present

## 2022-03-04 DIAGNOSIS — S32000D Wedge compression fracture of unspecified lumbar vertebra, subsequent encounter for fracture with routine healing: Secondary | ICD-10-CM | POA: Diagnosis not present

## 2022-03-04 NOTE — Telephone Encounter (Signed)
Patient's son has returned call to [place]. Wanted to let Dr.Tower know that Andrea Peters is being sent home today from rehab. Also states he had a question about her medications, doesn't know which ones needs refills and wants to know if it would be best to wait until she is at home so he can look at them to know which ones needs refills on.

## 2022-03-07 ENCOUNTER — Encounter: Payer: Self-pay | Admitting: Family Medicine

## 2022-03-07 DIAGNOSIS — I2699 Other pulmonary embolism without acute cor pulmonale: Secondary | ICD-10-CM

## 2022-03-07 MED ORDER — APIXABAN 5 MG PO TABS: 5.0000 mg | ORAL_TABLET | Freq: Two times a day (BID) | ORAL | 0 refills | Status: DC

## 2022-03-07 NOTE — Telephone Encounter (Signed)
Fyi.

## 2022-03-07 NOTE — Telephone Encounter (Signed)
I am out of the office this week  If someone can review her medication and let me know what to refill I can try to get to it after hours

## 2022-03-08 NOTE — Telephone Encounter (Signed)
Spoke with patient's son, Hilliard  (on [place]), regarding patient's symptoms. I have scheduled her for an acute visit with Catalina Antigua cable, NP for 03/09/2022.  Her son is aware that she will need a hospital follow-up with Dr. Glori Bickers once Dr. Glori Bickers has returned.

## 2022-03-09 ENCOUNTER — Ambulatory Visit (INDEPENDENT_AMBULATORY_CARE_PROVIDER_SITE_OTHER): Payer: Medicare Other | Admitting: Nurse Practitioner

## 2022-03-09 VITALS — BP 138/82 | HR 102 | Temp 97.4°F | Wt 141.1 lb

## 2022-03-09 DIAGNOSIS — R300 Dysuria: Secondary | ICD-10-CM | POA: Insufficient documentation

## 2022-03-09 DIAGNOSIS — N3090 Cystitis, unspecified without hematuria: Secondary | ICD-10-CM | POA: Diagnosis not present

## 2022-03-09 LAB — POC URINALSYSI DIPSTICK (AUTOMATED)
Bilirubin, UA: NEGATIVE
Glucose, UA: NEGATIVE
Ketones, UA: POSITIVE
Nitrite, UA: NEGATIVE
Protein, UA: POSITIVE — AB
Spec Grav, UA: 1.01 (ref 1.010–1.025)
Urobilinogen, UA: 0.2 E.U./dL
pH, UA: 7 (ref 5.0–8.0)

## 2022-03-09 MED ORDER — MAGNESIUM OXIDE 400 MG PO TABS: 1.0000 | ORAL_TABLET | Freq: Two times a day (BID) | ORAL | 0 refills | Status: DC

## 2022-03-09 MED ORDER — SULFAMETHOXAZOLE-TRIMETHOPRIM 800-160 MG PO TABS: 1.0000 | ORAL_TABLET | Freq: Two times a day (BID) | ORAL | 0 refills | Status: AC

## 2022-03-09 NOTE — Assessment & Plan Note (Signed)
Historical diagnosis had a hard time giving her magnesium up in the hospital and post hospital.  She was receiving magnesium oxide 400 mg twice daily while at the rehab facility which she was discharged on Sunday from.  Did check her blood a couple times per family report.  States they have over-the-counter versions with her larger capsules the patient has difficulty swallowing.  Will write 2 weeks worth of the magnesium and she get in with her primary care provider and recheck labs.  Did give signs and symptoms as to when to pull back on magnesium given diarrhea

## 2022-03-09 NOTE — Patient Instructions (Signed)
Nice to see you today Follow up with Dr. Glori Bickers this coming week I will be in touch with the lab results once I have have them

## 2022-03-09 NOTE — Assessment & Plan Note (Signed)
Recent UTIs after being hospitalized in rehab facility.  Sending urine culture

## 2022-03-09 NOTE — Progress Notes (Signed)
Acute Office Visit  Subjective:     Patient ID: Andrea Peters, female    DOB: 1946-01-26, 76 y.o.   MRN: [id number]  Chief Complaint  Patient presents with   burning with urination    Sx started 2 to 3 days ago, In the morning usually bad odor, burning with urination and cloudy urine.     HPI Patient is in today for UTI  2-3 days ago started She is experiencing dysuria, ordo and cloudy. States that it will clear throughout the day. 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  Currently anticoagulated.  Patient recently got discharged from rehab facility on Sunday, 03/06/2022.  Patient has yet to follow-up with her primary care provider. On top of that patient did test positive for C. difficile and was treated with vancomycin orally.  Review of Systems  Constitutional:  Negative for chills and fever.  Gastrointestinal:  Negative for abdominal pain, nausea and vomiting.  Musculoskeletal:  Negative for back pain.        Objective:    BP 138/82   Pulse (!) 102   Temp (!) 97.4 F (36.3 C)   Wt 141 lb 2 oz (64 kg)   SpO2 96%   BMI 26.67 kg/m    Physical Exam Vitals and nursing note reviewed.  Constitutional:      Appearance: Normal appearance.  Cardiovascular:     Rate and Rhythm: Normal rate and regular rhythm.     Heart sounds: Normal heart sounds.  Pulmonary:     Breath sounds: Normal breath sounds.  Abdominal:     Tenderness: There is generalized abdominal tenderness. There is no right CVA tenderness or left CVA tenderness.  Neurological:     Mental Status: She is alert.     Results for orders placed or performed in visit on 03/09/22  POCT Urinalysis Dipstick (Automated)  Result Value Ref Range   Color, UA yellow    Clarity, UA hazy    Glucose, UA Negative Negative   Bilirubin, UA negative    Ketones, UA positive    Spec Grav, UA 1.010 1.010 - 1.025   Blood, UA  small    pH, UA 7.0 5.0 - 8.0   Protein, UA Positive (A) Negative   Urobilinogen, UA 0.2 0.2 or 1.0 E.U./dL   Nitrite, UA negative    Leukocytes, UA Small (1+) (A) Negative        Assessment & Plan:   Problem List Items Addressed This Visit       Genitourinary   Cystitis    Patient's age, comorbidities, and symptoms will elect to treat with Bactrim DS 1 tab twice daily for 3 days.  Pending urine culture      Relevant Medications   sulfamethoxazole-trimethoprim (BACTRIM DS) 800-160 MG tablet     Other   Burning with urination - Primary    Recent UTIs after being hospitalized in rehab facility.  Sending urine culture      Relevant Orders   POCT Urinalysis Dipstick (Automated) (Completed)   Urine Culture   Hypomagnesemia    Historical diagnosis had a hard time giving her magnesium up in the hospital and post hospital.  She was receiving magnesium oxide 400 mg twice daily while at the rehab facility which she was discharged on Sunday from.  Did check her blood a couple times per family report.  States they have over-the-counter versions  with her larger capsules the patient has difficulty swallowing.  Will write 2 weeks worth of the magnesium and she get in with her primary care provider and recheck labs.  Did give signs and symptoms as to when to pull back on magnesium given diarrhea      Relevant Medications   magnesium oxide (MAG-OX) 400 MG tablet    Meds ordered this encounter  Medications   magnesium oxide (MAG-OX) 400 MG tablet    Sig: Take 1 tablet (400 mg total) by mouth in the morning and at bedtime.    Dispense:  30 tablet    Refill:  0   sulfamethoxazole-trimethoprim (BACTRIM DS) 800-160 MG tablet    Sig: Take 1 tablet by mouth 2 (two) times daily for 3 days.    Dispense:  6 tablet    Refill:  0    Order Specific Question:   Supervising Provider    Answer:   Loura Pardon A [[id number]]    Return in about 1 week (around 03/16/2022) for With Dr. Glori Bickers for hospital  follow-up.  Romilda Garret, NP

## 2022-03-09 NOTE — Assessment & Plan Note (Signed)
Patient's age, comorbidities, and symptoms will elect to treat with Bactrim DS 1 tab twice daily for 3 days.  Pending urine culture

## 2022-03-10 ENCOUNTER — Encounter: Payer: Self-pay | Admitting: Family Medicine

## 2022-03-10 NOTE — Telephone Encounter (Signed)
No available open appointments next week, can we use same day?

## 2022-03-10 NOTE — Telephone Encounter (Signed)
Spoke to son, Hilliard Clark. She was seen by Romilda Garret yesterday for a UTI. They discussed her medications and decided it was okay to wait until she sees Dr Glori Bickers to go over the medications. There is another message open asking about getting her in next week with Dr Glori Bickers. She will have to approve a spot.

## 2022-03-12 LAB — URINE CULTURE
MICRO NUMBER:: 13606671
SPECIMEN QUALITY:: ADEQUATE

## 2022-03-13 DIAGNOSIS — I10 Essential (primary) hypertension: Secondary | ICD-10-CM | POA: Diagnosis not present

## 2022-03-13 DIAGNOSIS — K5900 Constipation, unspecified: Secondary | ICD-10-CM | POA: Diagnosis not present

## 2022-03-13 DIAGNOSIS — E785 Hyperlipidemia, unspecified: Secondary | ICD-10-CM | POA: Diagnosis not present

## 2022-03-13 DIAGNOSIS — S32010D Wedge compression fracture of first lumbar vertebra, subsequent encounter for fracture with routine healing: Secondary | ICD-10-CM | POA: Diagnosis not present

## 2022-03-13 DIAGNOSIS — Z7901 Long term (current) use of anticoagulants: Secondary | ICD-10-CM | POA: Diagnosis not present

## 2022-03-13 DIAGNOSIS — E669 Obesity, unspecified: Secondary | ICD-10-CM | POA: Diagnosis not present

## 2022-03-13 DIAGNOSIS — K219 Gastro-esophageal reflux disease without esophagitis: Secondary | ICD-10-CM | POA: Diagnosis not present

## 2022-03-13 DIAGNOSIS — Z86711 Personal history of pulmonary embolism: Secondary | ICD-10-CM | POA: Diagnosis not present

## 2022-03-13 DIAGNOSIS — Z7952 Long term (current) use of systemic steroids: Secondary | ICD-10-CM | POA: Diagnosis not present

## 2022-03-13 DIAGNOSIS — G8929 Other chronic pain: Secondary | ICD-10-CM | POA: Diagnosis not present

## 2022-03-13 DIAGNOSIS — Z86718 Personal history of other venous thrombosis and embolism: Secondary | ICD-10-CM | POA: Diagnosis not present

## 2022-03-13 DIAGNOSIS — J45909 Unspecified asthma, uncomplicated: Secondary | ICD-10-CM | POA: Diagnosis not present

## 2022-03-13 DIAGNOSIS — N3946 Mixed incontinence: Secondary | ICD-10-CM | POA: Diagnosis not present

## 2022-03-13 DIAGNOSIS — M109 Gout, unspecified: Secondary | ICD-10-CM | POA: Diagnosis not present

## 2022-03-13 DIAGNOSIS — Z85820 Personal history of malignant melanoma of skin: Secondary | ICD-10-CM | POA: Diagnosis not present

## 2022-03-14 ENCOUNTER — Emergency Department
Admission: EM | Admit: 2022-03-14 | Discharge: 2022-03-14 | Disposition: A | Discharge status: Home / Self Care | Payer: Medicare Other | Attending: Emergency Medicine | Admitting: Emergency Medicine

## 2022-03-14 ENCOUNTER — Telehealth: Payer: Self-pay

## 2022-03-14 ENCOUNTER — Other Ambulatory Visit: Payer: Self-pay

## 2022-03-14 ENCOUNTER — Emergency Department: Payer: Medicare Other

## 2022-03-14 DIAGNOSIS — M542 Cervicalgia: Secondary | ICD-10-CM | POA: Diagnosis not present

## 2022-03-14 DIAGNOSIS — W01198A Fall on same level from slipping, tripping and stumbling with subsequent striking against other object, initial encounter: Secondary | ICD-10-CM | POA: Diagnosis not present

## 2022-03-14 DIAGNOSIS — M545 Low back pain, unspecified: Secondary | ICD-10-CM | POA: Diagnosis not present

## 2022-03-14 DIAGNOSIS — Y92009 Unspecified place in unspecified non-institutional (private) residence as the place of occurrence of the external cause: Secondary | ICD-10-CM | POA: Diagnosis not present

## 2022-03-14 DIAGNOSIS — M4312 Spondylolisthesis, cervical region: Secondary | ICD-10-CM | POA: Diagnosis not present

## 2022-03-14 DIAGNOSIS — K429 Umbilical hernia without obstruction or gangrene: Secondary | ICD-10-CM | POA: Diagnosis not present

## 2022-03-14 DIAGNOSIS — S060X0A Concussion without loss of consciousness, initial encounter: Secondary | ICD-10-CM

## 2022-03-14 DIAGNOSIS — S3992XA Unspecified injury of lower back, initial encounter: Secondary | ICD-10-CM | POA: Diagnosis present

## 2022-03-14 DIAGNOSIS — M533 Sacrococcygeal disorders, not elsewhere classified: Secondary | ICD-10-CM

## 2022-03-14 DIAGNOSIS — S0990XA Unspecified injury of head, initial encounter: Secondary | ICD-10-CM

## 2022-03-14 DIAGNOSIS — S3993XA Unspecified injury of pelvis, initial encounter: Secondary | ICD-10-CM | POA: Diagnosis not present

## 2022-03-14 DIAGNOSIS — K4020 Bilateral inguinal hernia, without obstruction or gangrene, not specified as recurrent: Secondary | ICD-10-CM | POA: Diagnosis not present

## 2022-03-14 NOTE — ED Triage Notes (Signed)
Pt states she was walking backwards in the garage and lost her balance causing her to fall back hitting her head on a chair, denies LOC. Pt c/o neck, head and lower back pain

## 2022-03-14 NOTE — Telephone Encounter (Signed)
Home Health verbal orders  Agency Name: [facility]   Requesting Skilled nursing  Frequency: 2W2 - 1W2 - 3M1 - 2 PRN   Please forward to [facility] pool or providers CMA

## 2022-03-14 NOTE — ED Notes (Signed)
See triage note. Presents s/p fall  States she was using a walker and was backing up and fell backwards  Having pain to lower back and head   No LOC

## 2022-03-14 NOTE — Telephone Encounter (Signed)
Please see [place] request for verbal orders below access nurse note. Per chart review tab pt is presently at [facility] ED. Sending note to Dr Glori Bickers and [place] CMA.   [place] Day - Client TELEPHONE ADVICE RECORD [facility] Patient Name: Andrea Peters Gender: Female DOB: 1946/04/11 Age: 76 Y 40 M 27 D Return Phone Number: [phone] (Primary), [phone] (Secondary) Address: City/ State/ Zip: [place] [address] Client [facility] Day - Client Client Site [facility] Provider Glori Bickers, Roque Lias - MD Contact Type Call Who Is Calling Patient / Member / Family / Caregiver Call Type Triage / Clinical Caller Name Shawn Relationship To Patient Son Return Phone Number [phone] (Primary) Chief Complaint Head Injury (non urgent symptom) Reason for Call Symptomatic / Request for [place] states his mother fell and hit her head. [place] Not Listed [facility] Translation No Nurse Assessment Nurse: Alvis Lemmings, RN, Marcie Bal Date/Time (Eastern Time): 03/14/2022 10:09:53 AM Confirm and document reason for call. If symptomatic, describe symptoms. ---Caller states his mother fell and hit her head 45 min. ago, fell on bottom and to her head on a wooden chair. No skin breaks. Unobserved. No LOC. Size of quarter. Does the patient have any new or worsening symptoms? ---Yes Will a triage be completed? ---Yes Related visit to physician within the last 2 weeks? ---Yes Does the PT have any chronic conditions? (i.e. diabetes, asthma, this includes High risk factors for pregnancy, etc.) ---Yes List chronic conditions. ---immunotherapy for CA, blood clots, blood thinners, HTN meds, Is this a behavioral health or substance abuse call? ---No Guidelines Guideline Title Affirmed Question Affirmed Notes Nurse Date/Time (Eastern Time) Head Injury Large swelling or bruise > 2 inches  (5 cm) Alvis Lemmings, RN, Marcie Bal 03/14/2022 10:14:08 AM Disp. Time Eilene Ghazi Time) Disposition Final User 03/14/2022 10:17:11 AM Go to ED Now Yes Alvis Lemmings, RN, Marcie Bal PLEASE NOTE: All timestamps contained within this report are represented as [place] Standard Time. CONFIDENTIALTY NOTICE: This fax transmission is intended only for the addressee. It contains information that is legally privileged, confidential or otherwise protected from use or disclosure. If you are not the intended recipient, you are strictly prohibited from reviewing, disclosing, copying using or disseminating any of this information or taking any action in reliance on or regarding this information. If you have received this fax in error, please notify us immediately by telephone so that we can arrange for its return to [place]. Phone: [phone], Toll-Free: [phone], Fax: [phone] Page: 2 of 2 Call Id: [id number] Final Disposition 03/14/2022 10:17:11 AM Go to ED Now Yes Alvis Lemmings, RN, Lenon Oms Disagree/Comply Comply Caller Understands Yes PreDisposition Call Doctor Care Advice Given Per Guideline GO TO ED NOW: * You need to be seen in the Emergency Department. * Go to the ED at ___________ [place] now. Drive carefully. USE A COLD PACK FOR PAIN, SWELLING, OR BRUISING: * Put a cold pack or an ice bag (wrapped in a moist towel) on the area for 20 minutes. * Repeat in 1 hour, then as needed. * This will help decrease pain, swelling, and bruising. NOTHING BY MOUTH: * Do not allow any eating or drinking. * Also avoid pain medicines until seen. Reason: Condition may need surgery and general anesthesia. CARE ADVICE given per Head Injury (Adult) guideline. Comments User: Manning Charity, RN Date/Time Eilene Ghazi Time): 03/14/2022 10:12:32 AM On Eliquis, for blood clots to legs and lungs. On Eliquis for week. In  the hosp. 2 weeks ago for IV blood thinners. Week ago, came home from rehab center. 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 Now, resolved. Referrals GO TO FACILITY OTHER - [facility]

## 2022-03-14 NOTE — Discharge Instructions (Addendum)
Please use ibuprofen (Motrin) up to 800 mg every 8 hours, naproxen (Naprosyn) up to 500 mg every 12 hours, and/or acetaminophen (Tylenol) up to 4 g/day for any continued pain

## 2022-03-14 NOTE — Telephone Encounter (Signed)
Aware, will watch for correspondence  

## 2022-03-14 NOTE — ED Provider Notes (Signed)
[facility] Provider Note   Event Date/Time   First MD Initiated Contact with Patient 03/14/22 1154     (approximate) History  Fall  HPI Andrea Peters is a 76 y.o. female who presents after mechanical fall from standing falling backwards onto her lower back and striking her head on a chair.  Patient denies any loss of consciousness.  Patient complains of posterior headache, neck pain, and lumbosacral back pain.  Patient denies any bowel/bladder incontinence, weakness/numbness/paresthesias in any extremity   Physical Exam  Triage Vital Signs: ED Triage Vitals  Enc Vitals Group     BP 03/14/22 1122 137/81     Pulse Rate 03/14/22 1122 (!) 120     Resp 03/14/22 1122 16     Temp --      Temp src --      SpO2 03/14/22 1122 96 %     Weight 03/14/22 1149 141 lb 1.5 oz (64 kg)     Height 03/14/22 1149 5\' 1"  (1.549 m)     Head Circumference --      Peak Flow --      Pain Score 03/14/22 1122 10     Pain Loc --      Pain Edu? --      Excl. in [place]? --    Most recent vital signs: Vitals:   03/14/22 1122 03/14/22 1347  BP: 137/81 130/78  Pulse: (!) 120 (!) 108  Resp: 16 16  SpO2: 96% 96%   General: Awake, oriented x4. CV:  Good peripheral perfusion.  Resp:  Normal effort.  Abd:  No distention.  Other:  Elderly Caucasian female laying in stretcher in no acute distress ED Results / Procedures / Treatments   RADIOLOGY ED MD interpretation: CT of the head without contrast interpreted by me shows no evidence of acute abnormalities including no intracerebral hemorrhage, obvious masses, or significant edema  CT of the cervical spine interpreted by me does not show any evidence of acute abnormalities including no acute fracture, malalignment, height loss, or dislocation  CT of the pelvis without contrast interpreted by me and shows no recent fracture or dislocation -Agree with radiology assessment Official radiology report(s): CT PELVIS WO  CONTRAST  Result Date: 03/14/2022 CLINICAL DATA:  Trauma, fall EXAM: CT PELVIS WITHOUT CONTRAST TECHNIQUE: Multidetector CT imaging of the pelvis was performed following the standard protocol without intravenous contrast. RADIATION DOSE REDUCTION: This exam was performed according to the departmental dose-optimization program which includes automated exposure control, adjustment of the mA and/or kV according to patient size and/or use of iterative reconstruction technique. COMPARISON:  CT abdomen and pelvis done on 09/24/2021 FINDINGS: Urinary Tract:  Urinary bladder is unremarkable. Bowel:  Visualized bowel loops are unremarkable. Vascular/Lymphatic: There are scattered arterial calcifications. Reproductive:  Uterus is not seen consistent with hysterectomy. Other: Small umbilical hernia containing fat is seen. Small bilateral inguinal hernias containing fat are noted. There is no ascites or pneumoperitoneum in the pelvis. Musculoskeletal: No displaced fractures are seen. There is no dislocation. Small smoothly marginated calcifications adjacent to the greater trochanter of both femurs may be residual from previous injury. IMPRESSION: No recent fracture or dislocation is seen in pelvis. Other findings as described in the body of the report. Electronically Signed   By: Elmer Picker M.D.   On: 03/14/2022 13:02   CT Cervical Spine Wo Contrast  Result Date: 03/14/2022 CLINICAL DATA:  Trauma, fall EXAM: CT CERVICAL SPINE WITHOUT CONTRAST TECHNIQUE: Multidetector CT imaging of  the cervical spine was performed without intravenous contrast. Multiplanar CT image reconstructions were also generated. RADIATION DOSE REDUCTION: This exam was performed according to the departmental dose-optimization program which includes automated exposure control, adjustment of the mA and/or kV according to patient size and/or use of iterative reconstruction technique. COMPARISON:  None Available. FINDINGS: Alignment: There is  minimal 2 mm anterolisthesis at C4-C5 level. This may be residual from previous ligament injury and facet degeneration. Skull base and vertebrae: No recent fracture is seen. Soft tissues and spinal canal: There is no central spinal stenosis. Posterior bony spurs are causing extrinsic pressure over the ventral margin of thecal sac at C4-C5 and C6-C7 levels. Disc levels: There is mild encroachment of neural foramina from C3-C7 levels. Upper chest: Few blebs are seen in the apices. Other: There is slightly inhomogeneous attenuation and thyroid. IMPRESSION: No recent fracture is seen in cervical spine. There is minimal 2 mm anterolisthesis at C4-C5 level which may be residual change from previous ligament injury and facet degeneration. There is encroachment of neural foramina from C3-C7 levels. Electronically Signed   By: Elmer Picker M.D.   On: 03/14/2022 12:50   CT Head Wo Contrast  Result Date: 03/14/2022 CLINICAL DATA:  Trauma, fall EXAM: CT HEAD WITHOUT CONTRAST TECHNIQUE: Contiguous axial images were obtained from the base of the skull through the vertex without intravenous contrast. RADIATION DOSE REDUCTION: This exam was performed according to the departmental dose-optimization program which includes automated exposure control, adjustment of the mA and/or kV according to patient size and/or use of iterative reconstruction technique. COMPARISON:  None Available. FINDINGS: Brain: No acute intracranial findings are seen in noncontrast CT brain. There are no signs of bleeding within the cranium. Ventricles are not dilated. Cortical sulci are prominent. Vascular: There are scattered arterial calcifications. Skull: No fracture is seen in the calvarium. Sinuses/Orbits: Unremarkable. Other: None. IMPRESSION: No acute intracranial findings are seen in noncontrast CT brain. Electronically Signed   By: Elmer Picker M.D.   On: 03/14/2022 12:45   PROCEDURES: Critical Care performed:  No Procedures MEDICATIONS ORDERED IN ED: Medications - No data to display IMPRESSION / MDM / [place] / ED COURSE  I reviewed the triage vital signs and the nursing notes.                             The patient is on the cardiac monitor to evaluate for evidence of arrhythmia and/or significant heart rate changes. Patient's presentation is most consistent with acute presentation with potential threat to life or bodily function. Presenting after a fall that occurred just prior to arrival, resulting in injury to the posterior scalp, neck, and lumbosacral spine. The mechanism of injury was a mechanical ground level fall without syncope or near-syncope. The current level of pain is moderate. There was no loss of consciousness, confusion, seizure, or memory impairment. There is not a laceration associated with the injury. Denies neck pain. The patient does take blood thinner medications. Denies vomiting, numbness/weakness, fever  Dispo: Discharge with PCP follow-up       FINAL CLINICAL IMPRESSION(S) / ED DIAGNOSES   Final diagnoses:  Fall in home, initial encounter  Injury of head, initial encounter  Acute neck pain  Sacral back pain  Concussion without loss of consciousness, initial encounter   Rx / DC Orders   ED Discharge Orders     None      Note:  This document was prepared  using Systems analyst and may include unintentional dictation errors.   Naaman Plummer, MD 03/14/22 [phone]

## 2022-03-15 ENCOUNTER — Encounter: Payer: Self-pay | Admitting: Family Medicine

## 2022-03-15 ENCOUNTER — Ambulatory Visit (INDEPENDENT_AMBULATORY_CARE_PROVIDER_SITE_OTHER): Payer: Medicare Other | Admitting: Family Medicine

## 2022-03-15 VITALS — BP 116/74 | HR 84 | Ht 61.0 in | Wt 141.0 lb

## 2022-03-15 DIAGNOSIS — S32018S Other fracture of first lumbar vertebra, sequela: Secondary | ICD-10-CM | POA: Diagnosis not present

## 2022-03-15 DIAGNOSIS — W19XXXS Unspecified fall, sequela: Secondary | ICD-10-CM | POA: Diagnosis not present

## 2022-03-15 DIAGNOSIS — M6281 Muscle weakness (generalized): Secondary | ICD-10-CM | POA: Diagnosis not present

## 2022-03-15 DIAGNOSIS — S32010D Wedge compression fracture of first lumbar vertebra, subsequent encounter for fracture with routine healing: Secondary | ICD-10-CM | POA: Diagnosis not present

## 2022-03-15 DIAGNOSIS — I2609 Other pulmonary embolism with acute cor pulmonale: Secondary | ICD-10-CM | POA: Diagnosis not present

## 2022-03-15 DIAGNOSIS — C4360 Malignant melanoma of unspecified upper limb, including shoulder: Secondary | ICD-10-CM

## 2022-03-15 DIAGNOSIS — G893 Neoplasm related pain (acute) (chronic): Secondary | ICD-10-CM | POA: Diagnosis not present

## 2022-03-15 DIAGNOSIS — F419 Anxiety disorder, unspecified: Secondary | ICD-10-CM | POA: Insufficient documentation

## 2022-03-15 DIAGNOSIS — J45909 Unspecified asthma, uncomplicated: Secondary | ICD-10-CM | POA: Diagnosis not present

## 2022-03-15 DIAGNOSIS — Y92009 Unspecified place in unspecified non-institutional (private) residence as the place of occurrence of the external cause: Secondary | ICD-10-CM

## 2022-03-15 DIAGNOSIS — S32019A Unspecified fracture of first lumbar vertebra, initial encounter for closed fracture: Secondary | ICD-10-CM | POA: Insufficient documentation

## 2022-03-15 DIAGNOSIS — I10 Essential (primary) hypertension: Secondary | ICD-10-CM | POA: Diagnosis not present

## 2022-03-15 DIAGNOSIS — D649 Anemia, unspecified: Secondary | ICD-10-CM | POA: Diagnosis not present

## 2022-03-15 DIAGNOSIS — M109 Gout, unspecified: Secondary | ICD-10-CM | POA: Diagnosis not present

## 2022-03-15 DIAGNOSIS — E038 Other specified hypothyroidism: Secondary | ICD-10-CM | POA: Diagnosis not present

## 2022-03-15 DIAGNOSIS — W19XXXA Unspecified fall, initial encounter: Secondary | ICD-10-CM | POA: Insufficient documentation

## 2022-03-15 DIAGNOSIS — G8929 Other chronic pain: Secondary | ICD-10-CM | POA: Diagnosis not present

## 2022-03-15 DIAGNOSIS — N3946 Mixed incontinence: Secondary | ICD-10-CM | POA: Diagnosis not present

## 2022-03-15 DIAGNOSIS — S81811A Laceration without foreign body, right lower leg, initial encounter: Secondary | ICD-10-CM | POA: Diagnosis not present

## 2022-03-15 DIAGNOSIS — I480 Paroxysmal atrial fibrillation: Secondary | ICD-10-CM | POA: Diagnosis not present

## 2022-03-15 LAB — COMPREHENSIVE METABOLIC PANEL
ALT: 33 U/L (ref 0–35)
AST: 25 U/L (ref 0–37)
Albumin: 3.9 g/dL (ref 3.5–5.2)
Alkaline Phosphatase: 102 U/L (ref 39–117)
BUN: 19 mg/dL (ref 6–23)
CO2: 25 mEq/L (ref 19–32)
Calcium: 9.9 mg/dL (ref 8.4–10.5)
Chloride: 99 mEq/L (ref 96–112)
Creatinine, Ser: 0.7 mg/dL (ref 0.40–1.20)
GFR: 83.33 mL/min (ref >60.00)
Glucose, Bld: 146 mg/dL — ABNORMAL HIGH (ref 70–99)
Potassium: 3.9 mEq/L (ref 3.5–5.1)
Sodium: 136 mEq/L (ref 135–145)
Total Bilirubin: 0.5 mg/dL (ref 0.2–1.2)
Total Protein: 6.2 g/dL (ref 6.0–8.3)

## 2022-03-15 LAB — MAGNESIUM: Magnesium: 1.9 mg/dL (ref 1.5–2.5)

## 2022-03-15 NOTE — Progress Notes (Signed)
Subjective:    Patient ID: Andrea Peters, female    DOB: 03-21-1946, 76 y.o.   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 Treated with immuno tx, still on prednisone (had this for colitis as well) then PE   Tx by Dr Raynelle Chary    She presented to [facility] ER yesterday after a fall- hitting low back and head on a chair (not syncope or near syncope) and no mental status change  No laceration   Neck and back pain  Dx with concussion   She was seen by [facility] on 7/5 for burning with urination /cystitis and tx with bactrim   Has pain control from oncology      Imaging CT PELVIS WO CONTRAST  Result Date: 03/14/2022 CLINICAL DATA:  Trauma, fall EXAM: CT PELVIS WITHOUT CONTRAST TECHNIQUE: Multidetector CT imaging of the pelvis was performed following the standard protocol without intravenous contrast. RADIATION DOSE REDUCTION: This exam was performed according to the departmental dose-optimization program which includes automated exposure control, adjustment of the mA and/or kV according to patient size and/or use of iterative reconstruction technique. COMPARISON:  CT abdomen and pelvis done on 09/24/2021 FINDINGS: Urinary Tract:  Urinary bladder is unremarkable. Bowel:  Visualized bowel loops are unremarkable. Vascular/Lymphatic: There are scattered arterial calcifications. Reproductive:  Uterus is not seen consistent with hysterectomy. Other: Small umbilical hernia containing fat is seen. Small bilateral inguinal hernias containing fat are noted. There is no ascites or pneumoperitoneum in the pelvis. Musculoskeletal: No displaced fractures are seen. There is no dislocation. Small smoothly marginated calcifications adjacent to the  greater trochanter of both femurs may be residual from previous injury. IMPRESSION: No recent fracture or dislocation is seen in pelvis. Other findings as described in the body of the report. Electronically Signed   By: Elmer Picker M.D.   On: 03/14/2022 13:02   CT Cervical Spine Wo Contrast  Result Date: 03/14/2022 CLINICAL DATA:  Trauma, fall EXAM: CT CERVICAL SPINE WITHOUT CONTRAST TECHNIQUE: Multidetector CT imaging of the cervical spine was performed without intravenous contrast. Multiplanar CT image reconstructions were also generated. RADIATION DOSE REDUCTION: This exam was performed according to the departmental dose-optimization program which includes automated exposure control, adjustment of the mA and/or kV according to patient size and/or use of iterative reconstruction technique. COMPARISON:  None Available. FINDINGS: Alignment: There is minimal 2 mm anterolisthesis at C4-C5 level. This may be residual from previous ligament injury and facet degeneration. Skull base and vertebrae: No recent fracture is seen. Soft tissues and spinal canal: There is no central spinal stenosis. Posterior bony spurs are causing extrinsic pressure over the ventral margin of thecal sac at C4-C5 and C6-C7 levels. Disc levels: There is mild encroachment of neural foramina from C3-C7 levels. Upper chest: Few blebs are seen in the apices. Other: There is slightly inhomogeneous attenuation and thyroid. IMPRESSION: No recent fracture is seen in cervical spine. There is minimal 2 mm anterolisthesis at C4-C5 level which may be residual change from previous ligament injury and facet degeneration. There is encroachment of neural foramina from C3-C7 levels. Electronically Signed   By: Elmer Picker M.D.   On: 03/14/2022  12:50   CT Head Wo Contrast  Result Date: 03/14/2022 CLINICAL DATA:  Trauma, fall EXAM: CT HEAD WITHOUT CONTRAST TECHNIQUE: Contiguous axial images were obtained from the base of the skull through  the vertex without intravenous contrast. RADIATION DOSE REDUCTION: This exam was performed according to the departmental dose-optimization program which includes automated exposure control, adjustment of the mA and/or kV according to patient size and/or use of iterative reconstruction technique. COMPARISON:  None Available. FINDINGS: Brain: No acute intracranial findings are seen in noncontrast CT brain. There are no signs of bleeding within the cranium. Ventricles are not dilated. Cortical sulci are prominent. Vascular: There are scattered arterial calcifications. Skull: No fracture is seen in the calvarium. Sinuses/Orbits: Unremarkable. Other: None. IMPRESSION: No acute intracranial findings are seen in noncontrast CT brain. Electronically Signed   By: Elmer Picker M.D.   On: 03/14/2022 12:45    Reassuring  No fractures No bleeding   Lab Results  Component Value Date   CREATININE 1.07 09/24/2021   BUN 25 (H) 09/24/2021   NA 136 09/24/2021   K 3.4 (L) 09/24/2021   CL 99 09/24/2021   CO2 29 09/24/2021   Lab Results  Component Value Date   ALT 11 09/24/2021   AST 15 09/24/2021   ALKPHOS 74 09/24/2021   BILITOT 0.8 09/24/2021   Lab Results  Component Value Date   WBC 11.3 (H) 09/24/2021   HGB 13.4 09/24/2021   HCT 40.9 09/24/2021   MCV 94.7 09/24/2021   PLT 304.0 09/24/2021    Has a chronic L1 fracture from the cancer   Right now her tailbone hurts the most  Hurts to get up but a bit better once she gets moving  Had a pressure injury from lying on back to start   Left shoulder is tender and bruised   Anxiety is a bigger problem now  Hydroxyzine makes her very sedated   She did injure her R lower leg getting in bed last night  Her husband was helping- caused a skin tear   Oncology- [facility] -2-3 months  Last brain scan was favorable  Metastasis is "resolved" Immuno therapy worked but caused side effects   Needs pain med refill in a week (long acting)  14 pills on  24th of June  [facility]   BP Readings from Last 3 Encounters:  03/15/22 116/74  03/14/22 130/78  03/09/22 138/82   Lab Results  Component Value Date   TSH 5.09 04/19/2021   Pain regimen from rehab Oxycontin 10 mg tid    (last refill 02/26/22)  42 pills which is 14 day supply  Oxycodone 5 mg Q 4 h prn Tylenol 975 mg q 12 h prn Lidocaine patch as needed   Latest px from [facility] (rehab) Prior [facility] Morgans in [place] Refilled in April from [place]   Patient Active Problem List   Diagnosis Date Noted   Skin tear of lower leg without complication, right, initial encounter 03/15/2022   Anxiety 03/15/2022   L1 vertebral fracture ([place]) 03/15/2022   Fall at home 03/15/2022   Cancer associated pain 03/15/2022   Hypomagnesemia 03/09/2022   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   Liver lesion 09/29/2021   Heart burn 09/24/2021   Epigastric pain 09/24/2021   Flank pain 09/24/2021   Decreased appetite 09/24/2021   Generalized abdominal pain 09/24/2021   Subclinical hypothyroidism 03/02/2021   Serum calcium elevated 11/05/2019   Prediabetes 10/26/2018   Hordeolum externum of right lower  eyelid 10/04/2018   Fatigue 03/29/2016   Hypokalemia 10/19/2015   Osteopenia 10/19/2015   Estrogen deficiency 10/15/2014   Melanoma of upper arm ([place]) 05/08/2013   Gout 11/17/2010   HYPERCHOLESTEROLEMIA, PURE 05/17/2007   ALLERGIC  RHINITIS 01/23/2007   Essential hypertension 12/20/2006   FASCIITIS, PLANTAR 12/20/2006   URINARY INCONTINENCE, STRESS 12/20/2006   Past Medical History:  Diagnosis Date   Allergic rhinitis    CHOLELITHIASIS, HX OF 11/17/2010   Qualifier: Diagnosis of  By: Glori Bickers MD, Carmell [place]    Dysplastic nevus 02/06/2013   R flank - moderate   Hyperglycemia    Hyperlipidemia    Hypertension    Melanoma ([place]) 08/17/2012   back of right arm   Mixed incontinence    Obesity    Plantar fasciitis    with significant disability   Tendonitis of foot    L,  chronic foot pain   Past Surgical History:  Procedure Laterality Date   ABDOMINAL HYSTERECTOMY     fibroids, ovaries intact   BREAST EXCISIONAL BIOPSY Bilateral 1960's -1970's   benign   EYE SURGERY Bilateral 12/2015   at [place]  5/07   Tendon rupture L foot   Social History   Tobacco Use   Smoking status: Never   Smokeless tobacco: Never  Vaping Use   Vaping Use: Never used  Substance Use Topics   Alcohol use: No    Alcohol/week: 0.0 standard drinks of alcohol   Drug use: No   Family History  Problem Relation Age of Onset   Heart attack Mother    Other Mother        ?thyroid problems   Lung cancer Father    Allergies  Allergen Reactions   Alendronate     Hair loss  Nausea Diarrhea    Lipitor [Atorvastatin]     Muscle pain   Naproxen Sodium    Bioflavonoids Rash   Naproxen Other (See Comments) and Rash    Severe indigestion   Current Outpatient Medications on File Prior to Visit  Medication Sig Dispense Refill   acetaminophen (TYLENOL) 325 MG tablet Take by mouth.     albuterol (PROVENTIL HFA;VENTOLIN HFA) 108 (90 Base) MCG/ACT inhaler Inhale 2 puffs into the lungs every 4 (four) hours as needed for wheezing or shortness of breath (cough, shortness of breath or wheezing.). 1 Inhaler 1   allopurinol (ZYLOPRIM) 300 MG tablet TAKE 1 TABLET BY MOUTH EVERY DAY 90 tablet 0   amLODipine (NORVASC) 10 MG tablet Take 1 tablet by mouth daily.     apixaban (ELIQUIS) 5 MG TABS tablet Take 1 tablet (5 mg total) by mouth 2 (two) times daily. 60 tablet 0   clobetasol ointment (TEMOVATE) 0.63 % Apply 1 Application topically 2 (two) times daily.     fluticasone (FLONASE) 50 MCG/ACT nasal spray SPRAY 2 SPRAYS INTO EACH NOSTRIL EVERY DAY 48 mL 2   fluticasone furoate-vilanterol (BREO ELLIPTA) 200-25 MCG/ACT AEPB USE 1 INHALATION ORALLY    DAILY 90 each 1   guaiFENesin (MUCINEX) 600 MG 12 hr tablet Take 1,200 mg by mouth daily.     magnesium oxide (MAG-OX) 400 MG  tablet Take 1 tablet (400 mg total) by mouth in the morning and at bedtime. 30 tablet 0   Melatonin 3 MG TBDP Take by mouth. As needed     Multiple Vitamin (MULTIVITAMIN) tablet Take 2 tablets by mouth daily.      naloxone (NARCAN) nasal spray 4 mg/0.1 mL  ondansetron (ZOFRAN-ODT) 4 MG disintegrating tablet Take by mouth.     oxyCODONE (OXY IR/ROXICODONE) 5 MG immediate release tablet Take 5 mg by mouth every 4 (four) hours as needed.     oxyCODONE (OXYCONTIN) 10 mg 12 hr tablet Take by mouth.     polyethylene glycol (MIRALAX / GLYCOLAX) 17 g packet Take by mouth.     potassium chloride (KLOR-CON) 10 MEQ tablet Take 1 tablet (10 mEq total) by mouth daily. 90 tablet 3   predniSONE (DELTASONE) 10 MG tablet Take 1 tablet by mouth daily.     senna-docusate (SENOKOT-S) 8.6-50 MG tablet Take by mouth.     colchicine 0.6 MG tablet as needed. Take 2  tablets now with food and then 1 tablet twice a day with food (Patient not taking: Reported on 03/09/2022)     No current facility-administered medications on file prior to visit.    Review of Systems  Constitutional:  Positive for fatigue. Negative for activity change, appetite change, fever and unexpected weight change.  HENT:  Negative for congestion, ear pain, rhinorrhea, sinus pressure and sore throat.   Eyes:  Negative for pain, redness and visual disturbance.  Respiratory:  Negative for cough, shortness of breath and wheezing.   Cardiovascular:  Negative for chest pain and palpitations.  Gastrointestinal:  Negative for abdominal pain, blood in stool, constipation and diarrhea.  Endocrine: Negative for polydipsia and polyuria.  Genitourinary:  Negative for dysuria, frequency and urgency.  Musculoskeletal:  Positive for arthralgias, back pain, gait problem and myalgias. Negative for joint swelling.  Skin:  Negative for pallor and rash.  Allergic/Immunologic: Negative for environmental allergies.  Neurological:  Negative for dizziness, syncope  and headaches.  Hematological:  Negative for adenopathy. Does not bruise/bleed easily.  Psychiatric/Behavioral:  Negative for decreased concentration and dysphoric mood. The patient is not nervous/anxious.        Objective:   Physical Exam Constitutional:      General: She is not in acute distress.    Appearance: Normal appearance. She is well-developed and normal weight. She is not ill-appearing or diaphoretic.     Comments: Frail appearing  Uncomfortable sitting due to coccyx pain and diffusely weak  HENT:     Head: Normocephalic and atraumatic.  Eyes:     Conjunctiva/sclera: Conjunctivae normal.     Pupils: Pupils are equal, round, and reactive to light.  Neck:     Thyroid: No thyromegaly.     Vascular: No carotid bruit or JVD.  Cardiovascular:     Rate and Rhythm: Normal rate and regular rhythm.     Heart sounds: Normal heart sounds.     No gallop.  Pulmonary:     Effort: Pulmonary effort is normal. No respiratory distress.     Breath sounds: Normal breath sounds. No wheezing or rales.  Abdominal:     General: There is no distension or abdominal bruit.     Palpations: Abdomen is soft.  Musculoskeletal:     Cervical back: Normal range of motion and neck supple.     Right lower leg: No edema.     Left lower leg: No edema.  Lymphadenopathy:     Cervical: No cervical adenopathy.  Skin:    General: Skin is warm and dry.     Coloration: Skin is not pale.     Findings: No rash.     Comments: 3 by 5 cm rectangular skin tear on R shin Actively bleeding after removal of gauze which was applied wet  and stuck on  No signs of infection   After consent obtained:  Cleaned with sterile saline  Non stick pad applied -wrapped with kerlex and then coban for gentle pressure to stop bleeding  Pt tolerated this well  Family given wound care directions   Neurological:     Mental Status: She is alert.     Coordination: Coordination normal.     Deep Tendon Reflexes: Reflexes are  normal and symmetric. Reflexes normal.  Psychiatric:        Mood and Affect: Mood normal.           Assessment & Plan:   Problem List Items Addressed This Visit       Cardiovascular and Mediastinum   Essential hypertension    bp is stable and well controlled  bp in fair control at this time  BP Readings from Last 1 Encounters:  03/15/22 116/74   No changes needed Most recent labs reviewed  Disc lifstyle change with low sodium diet and exercise   Amlodipine 10 mg daily        Relevant Orders   Comprehensive metabolic panel (Completed)     Endocrine   Subclinical hypothyroidism    Lab Results  Component Value Date   TSH 5.09 04/19/2021          Musculoskeletal and Integument   L1 vertebral fracture ([facility])    Pain regimen from rehab Oxycontin 10 mg tid    (last refill 02/26/22)  42 pills which is 14 day supply  Oxycodone 5 mg Q 4 h prn Tylenol 975 mg q 12 h prn Lidocaine patch as needed   Trying to find out what her weaning schedule may be for the oxycontin  Has been px from different providers  Database reviewed       Skin tear of lower leg without complication, right, initial encounter    Some bleeding (on eliquis)  After consent obtained- old dressing removed with sterile saline and replaced with non stick pad, kerlex and coban (slight pressure to stop bleeding)  Disc wound care for home Cover until hemostasis  and then to protect  Abx oint prn          Other   Anxiety    Emotionally labile  Tearful at times  Declines medication currently  Stress is high with medical issues   Hydroxyzine was too sedating      Cancer associated pain    Unsure who is responsible for pain management currently  Pt notes that f/u at [facility] will be 2-3 mo  Has a pathologic L1 fracture that has improved  Taking Oxycontin 10 mg tid    (last refill 02/26/22)  42 pills which is 14 day supply  Oxycodone 5 mg Q 4 h prn Tylenol 975 mg q 12 h prn Lidocaine patch as  needed   Asked pt to check with oncology regarding this  Last refill from rehab (proir was Dr Harvest Dark from [facility] and Dr Raynelle Chary from [facility])   [place] what the weaning plan or protocol should be      Fall at home - Primary    Fall yesterday backwards after loosing balance- sustaining blunt injury to buttocks, head and L shoulder  Reviewed hospital records, lab results and studies in detail   Some tailbone pain persists  Seen in ER and CT of pelvis is reassuring  Taking long and short acting oxycodone for cancer pain along with lidocaine patch  Disc s/s of concussion to watch  for  Disc use of donut pillow or cushion to offload the coccyx  F/u if no further improvement         Hypomagnesemia    Level today with cmet Taking mag ox 400 mg daily       Relevant Orders   Magnesium (Completed)   Melanoma of upper arm ([place])    Now with mets to brain, lung -tx with immunotherapy  Path fx of L1 PE    Next f[facility]u with [facility] months?  Unclear who is px pain medicine

## 2022-03-15 NOTE — Assessment & Plan Note (Signed)
Unsure who is responsible for pain management currently  Pt notes that f/u at [facility] will be 2-3 mo  Has a pathologic L1 fracture that has improved  Taking Oxycontin 10 mg tid    (last refill 02/26/22)  42 pills which is 14 day supply  Oxycodone 5 mg Q 4 h prn Tylenol 975 mg q 12 h prn Lidocaine patch as needed   Asked pt to check with oncology regarding this  Last refill from rehab (proir was Dr Harvest Dark from [facility] and Dr Raynelle Chary from [facility])   [place] what the weaning plan or protocol should be

## 2022-03-15 NOTE — Assessment & Plan Note (Signed)
Pain regimen from rehab Oxycontin 10 mg tid    (last refill 02/26/22)  42 pills which is 14 day supply  Oxycodone 5 mg Q 4 h prn Tylenol 975 mg q 12 h prn Lidocaine patch as needed   Trying to find out what her weaning schedule may be for the oxycontin  Has been px from different providers  Database reviewed

## 2022-03-15 NOTE — Assessment & Plan Note (Signed)
bp is stable and well controlled  bp in fair control at this time  BP Readings from Last 1 Encounters:  03/15/22 116/74   No changes needed Most recent labs reviewed  Disc lifstyle change with low sodium diet and exercise   Amlodipine 10 mg daily

## 2022-03-15 NOTE — Assessment & Plan Note (Signed)
Fall yesterday backwards after loosing balance- sustaining blunt injury to buttocks, head and L shoulder  Reviewed hospital records, lab results and studies in detail   Some tailbone pain persists  Seen in ER and CT of pelvis is reassuring  Taking long and short acting oxycodone for cancer pain along with lidocaine patch  Disc s/s of concussion to watch for  Disc use of donut pillow or cushion to offload the coccyx  F/u if no further improvement

## 2022-03-15 NOTE — Patient Instructions (Addendum)
Take your pain medicines as directed  Use a donut pillow for the tail bone pain  Keep me posted   Lab for chemistries and magnesium   Keep the skin tear clean  Use non stick pad under gauze  Do not use adhesive on skin  Antibiotic ointment is ok as well   Let me know if any problems with this  It will take some time and pressure to get the bleeding stopped    Call us in 1 week to sent in your long acting pain medicine   Check in with [facility] about the prednisone and about the pain medicine plan

## 2022-03-15 NOTE — Assessment & Plan Note (Signed)
Emotionally labile  Tearful at times  Declines medication currently  Stress is high with medical issues   Hydroxyzine was too sedating

## 2022-03-15 NOTE — Assessment & Plan Note (Signed)
Now with mets to brain, lung -tx with immunotherapy  Path fx of L1 PE    Next f/u with [facility] 2-3 months?  Unclear who is px pain medicine

## 2022-03-15 NOTE — Assessment & Plan Note (Signed)
Level today with cmet Taking mag ox 400 mg daily

## 2022-03-15 NOTE — Assessment & Plan Note (Signed)
Lab Results  Component Value Date   TSH 5.09 04/19/2021

## 2022-03-15 NOTE — Assessment & Plan Note (Signed)
Some bleeding (on eliquis)  After consent obtained- old dressing removed with sterile saline and replaced with non stick pad, kerlex and coban (slight pressure to stop bleeding)  Disc wound care for home Cover until hemostasis  and then to protect  Abx oint prn

## 2022-03-17 DIAGNOSIS — G8929 Other chronic pain: Secondary | ICD-10-CM | POA: Diagnosis not present

## 2022-03-17 DIAGNOSIS — J45909 Unspecified asthma, uncomplicated: Secondary | ICD-10-CM | POA: Diagnosis not present

## 2022-03-17 DIAGNOSIS — I10 Essential (primary) hypertension: Secondary | ICD-10-CM | POA: Diagnosis not present

## 2022-03-17 DIAGNOSIS — S32010D Wedge compression fracture of first lumbar vertebra, subsequent encounter for fracture with routine healing: Secondary | ICD-10-CM | POA: Diagnosis not present

## 2022-03-17 DIAGNOSIS — M109 Gout, unspecified: Secondary | ICD-10-CM | POA: Diagnosis not present

## 2022-03-17 DIAGNOSIS — N3946 Mixed incontinence: Secondary | ICD-10-CM | POA: Diagnosis not present

## 2022-03-17 NOTE — Telephone Encounter (Signed)
Called and spoke with Andrea Peters  gave verbal okay for skilled nurse for patient.

## 2022-03-17 NOTE — Telephone Encounter (Signed)
Andrea Peters with [facility] called back to follow up on orders to see if a verbal could be given. Call back is [phone] Direct line

## 2022-03-18 DIAGNOSIS — J45909 Unspecified asthma, uncomplicated: Secondary | ICD-10-CM | POA: Diagnosis not present

## 2022-03-18 DIAGNOSIS — I10 Essential (primary) hypertension: Secondary | ICD-10-CM | POA: Diagnosis not present

## 2022-03-18 DIAGNOSIS — G8929 Other chronic pain: Secondary | ICD-10-CM | POA: Diagnosis not present

## 2022-03-18 DIAGNOSIS — M109 Gout, unspecified: Secondary | ICD-10-CM | POA: Diagnosis not present

## 2022-03-18 DIAGNOSIS — N3946 Mixed incontinence: Secondary | ICD-10-CM | POA: Diagnosis not present

## 2022-03-18 DIAGNOSIS — S32010D Wedge compression fracture of first lumbar vertebra, subsequent encounter for fracture with routine healing: Secondary | ICD-10-CM | POA: Diagnosis not present

## 2022-03-19 ENCOUNTER — Other Ambulatory Visit: Payer: Self-pay | Admitting: Family Medicine

## 2022-03-20 ENCOUNTER — Encounter: Payer: Self-pay | Admitting: Family Medicine

## 2022-03-21 ENCOUNTER — Other Ambulatory Visit: Payer: Self-pay | Admitting: Nurse Practitioner

## 2022-03-21 DIAGNOSIS — J45909 Unspecified asthma, uncomplicated: Secondary | ICD-10-CM | POA: Diagnosis not present

## 2022-03-21 DIAGNOSIS — G8929 Other chronic pain: Secondary | ICD-10-CM | POA: Diagnosis not present

## 2022-03-21 DIAGNOSIS — N3946 Mixed incontinence: Secondary | ICD-10-CM | POA: Diagnosis not present

## 2022-03-21 DIAGNOSIS — M109 Gout, unspecified: Secondary | ICD-10-CM | POA: Diagnosis not present

## 2022-03-21 DIAGNOSIS — S32010D Wedge compression fracture of first lumbar vertebra, subsequent encounter for fracture with routine healing: Secondary | ICD-10-CM | POA: Diagnosis not present

## 2022-03-21 DIAGNOSIS — I10 Essential (primary) hypertension: Secondary | ICD-10-CM | POA: Diagnosis not present

## 2022-03-21 MED ORDER — AMLODIPINE BESYLATE 10 MG PO TABS: 10.0000 mg | ORAL_TABLET | Freq: Every day | ORAL | 3 refills | Status: DC

## 2022-03-21 MED ORDER — MAGNESIUM OXIDE 400 MG PO TABS: 1.0000 | ORAL_TABLET | Freq: Two times a day (BID) | ORAL | 0 refills | Status: DC

## 2022-03-21 NOTE — Telephone Encounter (Signed)
Is this okay to refill Andrea Peters gave her the magesium on 03/09/22, and looks like she has been getting the Amlodipine from another provide or some else has given this to her.

## 2022-03-22 ENCOUNTER — Telehealth: Payer: Self-pay

## 2022-03-22 MED ORDER — OXYCODONE HCL ER 10 MG PO T12A: 10.0000 mg | EXTENDED_RELEASE_TABLET | Freq: Three times a day (TID) | ORAL | 0 refills | Status: DC

## 2022-03-22 NOTE — Telephone Encounter (Signed)
Pt son called in requesting pt RX oxycodone be sent to [facility] . Stated she is out of medication

## 2022-03-22 NOTE — Telephone Encounter (Signed)
Prior auth started and approved for oxyCODONE HCl ER 10MG  er tablets. Dorrene [facility] [place] [place] [facility] [place] Case ID: [id number] - Rx #: [id number]  The Prior Authorization request has been approved for oxyCODONE HCl ER 10MG  OR T12A. The authorization is valid from 02/20/2022 through 09/18/2022.  Approval letter sent to scanning.

## 2022-03-22 NOTE — Telephone Encounter (Signed)
I sent it  Thanks

## 2022-03-22 NOTE — Telephone Encounter (Signed)
Forwarding

## 2022-03-22 NOTE — Addendum Note (Signed)
Addended by: Loura Pardon A on: 03/22/2022 03:10 PM   Modules accepted: Orders

## 2022-03-23 DIAGNOSIS — I10 Essential (primary) hypertension: Secondary | ICD-10-CM | POA: Diagnosis not present

## 2022-03-23 DIAGNOSIS — J45909 Unspecified asthma, uncomplicated: Secondary | ICD-10-CM | POA: Diagnosis not present

## 2022-03-23 DIAGNOSIS — G8929 Other chronic pain: Secondary | ICD-10-CM | POA: Diagnosis not present

## 2022-03-23 DIAGNOSIS — N3946 Mixed incontinence: Secondary | ICD-10-CM | POA: Diagnosis not present

## 2022-03-23 DIAGNOSIS — S32010D Wedge compression fracture of first lumbar vertebra, subsequent encounter for fracture with routine healing: Secondary | ICD-10-CM | POA: Diagnosis not present

## 2022-03-23 DIAGNOSIS — M109 Gout, unspecified: Secondary | ICD-10-CM | POA: Diagnosis not present

## 2022-03-24 DIAGNOSIS — Z7901 Long term (current) use of anticoagulants: Secondary | ICD-10-CM | POA: Diagnosis not present

## 2022-03-24 DIAGNOSIS — K219 Gastro-esophageal reflux disease without esophagitis: Secondary | ICD-10-CM | POA: Diagnosis not present

## 2022-03-24 DIAGNOSIS — Z86718 Personal history of other venous thrombosis and embolism: Secondary | ICD-10-CM

## 2022-03-24 DIAGNOSIS — M109 Gout, unspecified: Secondary | ICD-10-CM | POA: Diagnosis not present

## 2022-03-24 DIAGNOSIS — G8929 Other chronic pain: Secondary | ICD-10-CM | POA: Diagnosis not present

## 2022-03-24 DIAGNOSIS — I10 Essential (primary) hypertension: Secondary | ICD-10-CM | POA: Diagnosis not present

## 2022-03-24 DIAGNOSIS — Z7952 Long term (current) use of systemic steroids: Secondary | ICD-10-CM | POA: Diagnosis not present

## 2022-03-24 DIAGNOSIS — E785 Hyperlipidemia, unspecified: Secondary | ICD-10-CM | POA: Diagnosis not present

## 2022-03-24 DIAGNOSIS — E669 Obesity, unspecified: Secondary | ICD-10-CM | POA: Diagnosis not present

## 2022-03-24 DIAGNOSIS — Z86711 Personal history of pulmonary embolism: Secondary | ICD-10-CM

## 2022-03-24 DIAGNOSIS — Z85820 Personal history of malignant melanoma of skin: Secondary | ICD-10-CM

## 2022-03-24 DIAGNOSIS — N3946 Mixed incontinence: Secondary | ICD-10-CM | POA: Diagnosis not present

## 2022-03-24 DIAGNOSIS — S32010D Wedge compression fracture of first lumbar vertebra, subsequent encounter for fracture with routine healing: Secondary | ICD-10-CM | POA: Diagnosis not present

## 2022-03-24 DIAGNOSIS — K5900 Constipation, unspecified: Secondary | ICD-10-CM | POA: Diagnosis not present

## 2022-03-24 DIAGNOSIS — J45909 Unspecified asthma, uncomplicated: Secondary | ICD-10-CM | POA: Diagnosis not present

## 2022-03-25 DIAGNOSIS — G8929 Other chronic pain: Secondary | ICD-10-CM | POA: Diagnosis not present

## 2022-03-25 DIAGNOSIS — N3946 Mixed incontinence: Secondary | ICD-10-CM | POA: Diagnosis not present

## 2022-03-25 DIAGNOSIS — S32010D Wedge compression fracture of first lumbar vertebra, subsequent encounter for fracture with routine healing: Secondary | ICD-10-CM | POA: Diagnosis not present

## 2022-03-25 DIAGNOSIS — I10 Essential (primary) hypertension: Secondary | ICD-10-CM | POA: Diagnosis not present

## 2022-03-25 DIAGNOSIS — J45909 Unspecified asthma, uncomplicated: Secondary | ICD-10-CM | POA: Diagnosis not present

## 2022-03-25 DIAGNOSIS — M109 Gout, unspecified: Secondary | ICD-10-CM | POA: Diagnosis not present

## 2022-04-04 DIAGNOSIS — N3946 Mixed incontinence: Secondary | ICD-10-CM | POA: Diagnosis not present

## 2022-04-04 DIAGNOSIS — M109 Gout, unspecified: Secondary | ICD-10-CM | POA: Diagnosis not present

## 2022-04-04 DIAGNOSIS — I10 Essential (primary) hypertension: Secondary | ICD-10-CM | POA: Diagnosis not present

## 2022-04-04 DIAGNOSIS — J45909 Unspecified asthma, uncomplicated: Secondary | ICD-10-CM | POA: Diagnosis not present

## 2022-04-04 DIAGNOSIS — S32010D Wedge compression fracture of first lumbar vertebra, subsequent encounter for fracture with routine healing: Secondary | ICD-10-CM | POA: Diagnosis not present

## 2022-04-04 DIAGNOSIS — G8929 Other chronic pain: Secondary | ICD-10-CM | POA: Diagnosis not present

## 2022-04-04 MED ORDER — OXYCODONE HCL ER 10 MG PO T12A: 10.0000 mg | EXTENDED_RELEASE_TABLET | Freq: Three times a day (TID) | ORAL | 0 refills | Status: DC

## 2022-04-04 NOTE — Addendum Note (Signed)
Addended by: Loura Pardon A on: 04/04/2022 04:53 PM   Modules accepted: Orders

## 2022-04-05 ENCOUNTER — Other Ambulatory Visit: Payer: Self-pay | Admitting: Family Medicine

## 2022-04-06 ENCOUNTER — Other Ambulatory Visit: Payer: Self-pay | Admitting: Primary Care

## 2022-04-06 DIAGNOSIS — G8929 Other chronic pain: Secondary | ICD-10-CM | POA: Diagnosis not present

## 2022-04-06 DIAGNOSIS — M109 Gout, unspecified: Secondary | ICD-10-CM | POA: Diagnosis not present

## 2022-04-06 DIAGNOSIS — J45909 Unspecified asthma, uncomplicated: Secondary | ICD-10-CM | POA: Diagnosis not present

## 2022-04-06 DIAGNOSIS — N3946 Mixed incontinence: Secondary | ICD-10-CM | POA: Diagnosis not present

## 2022-04-06 DIAGNOSIS — S32010D Wedge compression fracture of first lumbar vertebra, subsequent encounter for fracture with routine healing: Secondary | ICD-10-CM | POA: Diagnosis not present

## 2022-04-06 DIAGNOSIS — I10 Essential (primary) hypertension: Secondary | ICD-10-CM | POA: Diagnosis not present

## 2022-04-06 DIAGNOSIS — I2699 Other pulmonary embolism without acute cor pulmonale: Secondary | ICD-10-CM

## 2022-04-08 DIAGNOSIS — Z006 Encounter for examination for normal comparison and control in clinical research program: Secondary | ICD-10-CM | POA: Diagnosis not present

## 2022-04-08 DIAGNOSIS — C439 Malignant melanoma of skin, unspecified: Secondary | ICD-10-CM | POA: Diagnosis not present

## 2022-04-08 DIAGNOSIS — C7931 Secondary malignant neoplasm of brain: Secondary | ICD-10-CM | POA: Diagnosis not present

## 2022-04-11 ENCOUNTER — Encounter: Payer: Self-pay | Admitting: Family Medicine

## 2022-04-11 DIAGNOSIS — R531 Weakness: Secondary | ICD-10-CM | POA: Diagnosis not present

## 2022-04-11 DIAGNOSIS — K521 Toxic gastroenteritis and colitis: Secondary | ICD-10-CM | POA: Diagnosis not present

## 2022-04-11 DIAGNOSIS — C7931 Secondary malignant neoplasm of brain: Secondary | ICD-10-CM | POA: Diagnosis not present

## 2022-04-11 DIAGNOSIS — S32010D Wedge compression fracture of first lumbar vertebra, subsequent encounter for fracture with routine healing: Secondary | ICD-10-CM | POA: Diagnosis not present

## 2022-04-11 DIAGNOSIS — G8929 Other chronic pain: Secondary | ICD-10-CM | POA: Diagnosis not present

## 2022-04-11 DIAGNOSIS — I10 Essential (primary) hypertension: Secondary | ICD-10-CM | POA: Diagnosis not present

## 2022-04-11 DIAGNOSIS — C439 Malignant melanoma of skin, unspecified: Secondary | ICD-10-CM | POA: Diagnosis not present

## 2022-04-11 DIAGNOSIS — J45909 Unspecified asthma, uncomplicated: Secondary | ICD-10-CM | POA: Diagnosis not present

## 2022-04-11 DIAGNOSIS — R300 Dysuria: Secondary | ICD-10-CM | POA: Diagnosis not present

## 2022-04-11 DIAGNOSIS — R21 Rash and other nonspecific skin eruption: Secondary | ICD-10-CM | POA: Diagnosis not present

## 2022-04-11 DIAGNOSIS — M109 Gout, unspecified: Secondary | ICD-10-CM | POA: Diagnosis not present

## 2022-04-11 DIAGNOSIS — R5383 Other fatigue: Secondary | ICD-10-CM | POA: Diagnosis not present

## 2022-04-11 DIAGNOSIS — N3946 Mixed incontinence: Secondary | ICD-10-CM | POA: Diagnosis not present

## 2022-04-11 DIAGNOSIS — N390 Urinary tract infection, site not specified: Secondary | ICD-10-CM | POA: Diagnosis not present

## 2022-04-12 DIAGNOSIS — Z86718 Personal history of other venous thrombosis and embolism: Secondary | ICD-10-CM | POA: Diagnosis not present

## 2022-04-12 DIAGNOSIS — M109 Gout, unspecified: Secondary | ICD-10-CM | POA: Diagnosis not present

## 2022-04-12 DIAGNOSIS — Z7952 Long term (current) use of systemic steroids: Secondary | ICD-10-CM | POA: Diagnosis not present

## 2022-04-12 DIAGNOSIS — Z7901 Long term (current) use of anticoagulants: Secondary | ICD-10-CM | POA: Diagnosis not present

## 2022-04-12 DIAGNOSIS — S32010D Wedge compression fracture of first lumbar vertebra, subsequent encounter for fracture with routine healing: Secondary | ICD-10-CM | POA: Diagnosis not present

## 2022-04-12 DIAGNOSIS — Z86711 Personal history of pulmonary embolism: Secondary | ICD-10-CM | POA: Diagnosis not present

## 2022-04-12 DIAGNOSIS — Z85820 Personal history of malignant melanoma of skin: Secondary | ICD-10-CM | POA: Diagnosis not present

## 2022-04-12 DIAGNOSIS — E785 Hyperlipidemia, unspecified: Secondary | ICD-10-CM | POA: Diagnosis not present

## 2022-04-12 DIAGNOSIS — G8929 Other chronic pain: Secondary | ICD-10-CM | POA: Diagnosis not present

## 2022-04-12 DIAGNOSIS — K5900 Constipation, unspecified: Secondary | ICD-10-CM | POA: Diagnosis not present

## 2022-04-12 DIAGNOSIS — I10 Essential (primary) hypertension: Secondary | ICD-10-CM | POA: Diagnosis not present

## 2022-04-12 DIAGNOSIS — J45909 Unspecified asthma, uncomplicated: Secondary | ICD-10-CM | POA: Diagnosis not present

## 2022-04-12 DIAGNOSIS — E669 Obesity, unspecified: Secondary | ICD-10-CM | POA: Diagnosis not present

## 2022-04-12 DIAGNOSIS — K219 Gastro-esophageal reflux disease without esophagitis: Secondary | ICD-10-CM | POA: Diagnosis not present

## 2022-04-12 DIAGNOSIS — N3946 Mixed incontinence: Secondary | ICD-10-CM | POA: Diagnosis not present

## 2022-04-14 ENCOUNTER — Other Ambulatory Visit: Payer: Self-pay | Admitting: Family

## 2022-04-14 DIAGNOSIS — R12 Heartburn: Secondary | ICD-10-CM

## 2022-04-14 DIAGNOSIS — R1013 Epigastric pain: Secondary | ICD-10-CM

## 2022-04-18 ENCOUNTER — Encounter: Payer: Self-pay | Admitting: Family Medicine

## 2022-04-18 DIAGNOSIS — S32010D Wedge compression fracture of first lumbar vertebra, subsequent encounter for fracture with routine healing: Secondary | ICD-10-CM | POA: Diagnosis not present

## 2022-04-18 DIAGNOSIS — N3946 Mixed incontinence: Secondary | ICD-10-CM | POA: Diagnosis not present

## 2022-04-18 DIAGNOSIS — I10 Essential (primary) hypertension: Secondary | ICD-10-CM | POA: Diagnosis not present

## 2022-04-18 DIAGNOSIS — I2699 Other pulmonary embolism without acute cor pulmonale: Secondary | ICD-10-CM

## 2022-04-18 DIAGNOSIS — G8929 Other chronic pain: Secondary | ICD-10-CM | POA: Diagnosis not present

## 2022-04-18 DIAGNOSIS — J45909 Unspecified asthma, uncomplicated: Secondary | ICD-10-CM | POA: Diagnosis not present

## 2022-04-18 DIAGNOSIS — M109 Gout, unspecified: Secondary | ICD-10-CM | POA: Diagnosis not present

## 2022-04-18 MED ORDER — APIXABAN 5 MG PO TABS
5.0000 mg | ORAL_TABLET | Freq: Two times a day (BID) | ORAL | 1 refills | Status: DC
Start: 2022-04-18 — End: 2022-11-02

## 2022-04-19 DIAGNOSIS — G8929 Other chronic pain: Secondary | ICD-10-CM | POA: Diagnosis not present

## 2022-04-19 DIAGNOSIS — N3946 Mixed incontinence: Secondary | ICD-10-CM | POA: Diagnosis not present

## 2022-04-19 DIAGNOSIS — J45909 Unspecified asthma, uncomplicated: Secondary | ICD-10-CM | POA: Diagnosis not present

## 2022-04-19 DIAGNOSIS — I10 Essential (primary) hypertension: Secondary | ICD-10-CM | POA: Diagnosis not present

## 2022-04-19 DIAGNOSIS — S32010D Wedge compression fracture of first lumbar vertebra, subsequent encounter for fracture with routine healing: Secondary | ICD-10-CM | POA: Diagnosis not present

## 2022-04-19 DIAGNOSIS — R5383 Other fatigue: Secondary | ICD-10-CM | POA: Diagnosis not present

## 2022-04-19 DIAGNOSIS — M109 Gout, unspecified: Secondary | ICD-10-CM | POA: Diagnosis not present

## 2022-04-19 NOTE — Telephone Encounter (Signed)
Please find out from pt or family if she needs this thanks

## 2022-04-21 ENCOUNTER — Other Ambulatory Visit: Payer: Self-pay | Admitting: Family Medicine

## 2022-04-29 DIAGNOSIS — S32010D Wedge compression fracture of first lumbar vertebra, subsequent encounter for fracture with routine healing: Secondary | ICD-10-CM | POA: Diagnosis not present

## 2022-04-29 DIAGNOSIS — N3946 Mixed incontinence: Secondary | ICD-10-CM | POA: Diagnosis not present

## 2022-04-29 DIAGNOSIS — M109 Gout, unspecified: Secondary | ICD-10-CM | POA: Diagnosis not present

## 2022-04-29 DIAGNOSIS — J45909 Unspecified asthma, uncomplicated: Secondary | ICD-10-CM | POA: Diagnosis not present

## 2022-04-29 DIAGNOSIS — G8929 Other chronic pain: Secondary | ICD-10-CM | POA: Diagnosis not present

## 2022-04-29 DIAGNOSIS — I10 Essential (primary) hypertension: Secondary | ICD-10-CM | POA: Diagnosis not present

## 2022-05-02 DIAGNOSIS — I10 Essential (primary) hypertension: Secondary | ICD-10-CM | POA: Diagnosis not present

## 2022-05-02 DIAGNOSIS — J45909 Unspecified asthma, uncomplicated: Secondary | ICD-10-CM | POA: Diagnosis not present

## 2022-05-02 DIAGNOSIS — N3946 Mixed incontinence: Secondary | ICD-10-CM | POA: Diagnosis not present

## 2022-05-02 DIAGNOSIS — M109 Gout, unspecified: Secondary | ICD-10-CM | POA: Diagnosis not present

## 2022-05-02 DIAGNOSIS — G8929 Other chronic pain: Secondary | ICD-10-CM | POA: Diagnosis not present

## 2022-05-02 DIAGNOSIS — S32010D Wedge compression fracture of first lumbar vertebra, subsequent encounter for fracture with routine healing: Secondary | ICD-10-CM | POA: Diagnosis not present

## 2022-05-03 ENCOUNTER — Encounter: Payer: Self-pay | Admitting: Family Medicine

## 2022-05-03 DIAGNOSIS — N3946 Mixed incontinence: Secondary | ICD-10-CM | POA: Diagnosis not present

## 2022-05-03 DIAGNOSIS — S32010D Wedge compression fracture of first lumbar vertebra, subsequent encounter for fracture with routine healing: Secondary | ICD-10-CM | POA: Diagnosis not present

## 2022-05-03 DIAGNOSIS — G8929 Other chronic pain: Secondary | ICD-10-CM | POA: Diagnosis not present

## 2022-05-03 DIAGNOSIS — I10 Essential (primary) hypertension: Secondary | ICD-10-CM | POA: Diagnosis not present

## 2022-05-03 DIAGNOSIS — J45909 Unspecified asthma, uncomplicated: Secondary | ICD-10-CM | POA: Diagnosis not present

## 2022-05-03 DIAGNOSIS — M109 Gout, unspecified: Secondary | ICD-10-CM | POA: Diagnosis not present

## 2022-05-05 ENCOUNTER — Ambulatory Visit (INDEPENDENT_AMBULATORY_CARE_PROVIDER_SITE_OTHER): Payer: Medicare Other | Admitting: Family

## 2022-05-05 ENCOUNTER — Encounter: Payer: Self-pay | Admitting: Family

## 2022-05-05 VITALS — BP 120/68 | HR 81 | Temp 98.3°F | Resp 16 | Ht 61.0 in | Wt 155.4 lb

## 2022-05-05 DIAGNOSIS — J45909 Unspecified asthma, uncomplicated: Secondary | ICD-10-CM | POA: Diagnosis not present

## 2022-05-05 DIAGNOSIS — S32010D Wedge compression fracture of first lumbar vertebra, subsequent encounter for fracture with routine healing: Secondary | ICD-10-CM | POA: Diagnosis not present

## 2022-05-05 DIAGNOSIS — R300 Dysuria: Secondary | ICD-10-CM | POA: Insufficient documentation

## 2022-05-05 DIAGNOSIS — G8929 Other chronic pain: Secondary | ICD-10-CM | POA: Diagnosis not present

## 2022-05-05 DIAGNOSIS — M109 Gout, unspecified: Secondary | ICD-10-CM | POA: Diagnosis not present

## 2022-05-05 DIAGNOSIS — N3946 Mixed incontinence: Secondary | ICD-10-CM | POA: Diagnosis not present

## 2022-05-05 DIAGNOSIS — N3000 Acute cystitis without hematuria: Secondary | ICD-10-CM | POA: Diagnosis not present

## 2022-05-05 DIAGNOSIS — I10 Essential (primary) hypertension: Secondary | ICD-10-CM | POA: Diagnosis not present

## 2022-05-05 DIAGNOSIS — R109 Unspecified abdominal pain: Secondary | ICD-10-CM | POA: Diagnosis not present

## 2022-05-05 LAB — POC URINALSYSI DIPSTICK (AUTOMATED)
Bilirubin, UA: NEGATIVE
Blood, UA: NEGATIVE
Glucose, UA: NEGATIVE
Leukocytes, UA: NEGATIVE
Nitrite, UA: NEGATIVE
Protein, UA: POSITIVE — AB
Spec Grav, UA: >=1.03 — AB (ref 1.010–1.025)
Urobilinogen, UA: 0.2 E.U./dL
pH, UA: 5 (ref 5.0–8.0)

## 2022-05-05 MED ORDER — CEPHALEXIN 500 MG PO CAPS: 500.0000 mg | ORAL_CAPSULE | Freq: Three times a day (TID) | ORAL | 0 refills | Status: AC

## 2022-05-05 MED ORDER — CEPHALEXIN 500 MG PO CAPS: 500.0000 mg | ORAL_CAPSULE | Freq: Three times a day (TID) | ORAL | 0 refills | Status: DC

## 2022-05-05 NOTE — Assessment & Plan Note (Addendum)
poct urine dip in office Urine culture ordered pending results  I evaluated the patient,  was consulted regarding plans for treatment of care, and agree with the assessment and plan per Tinnie Gens, RN, DNP student.   Eugenia Pancoast, FNP-C

## 2022-05-05 NOTE — Progress Notes (Signed)
Established Patient Office Visit  Subjective:  Patient ID: Andrea Peters, female    DOB: 01-Aug-1946  Age: 76 y.o. MRN: [id number]  CC:  Chief Complaint  Patient presents with   Urinary Tract Infection    X 2 Weeks just got over medication     HPI Andrea Peters is here with acute concerns.  Over the last week you had burning after urinating. Without urinary frequency and or urgency.   Over the last few days or week, she has pain when she stands up. Does feel muscular however. She does have chronic back pain as well. Denies flank pain.   No blood in urine.  No fever or chills.  No increased weakness.   Was on three days bactrim July 5th , then saw provider at [facility] and was given ten total days, this was back in beginning of July.   Past Medical History:  Diagnosis Date   Allergic rhinitis    CHOLELITHIASIS, HX OF 11/17/2010   Qualifier: Diagnosis of  By: Glori Bickers MD, Carmell Austria    Dysplastic nevus 02/06/2013   R flank - moderate   Hyperglycemia    Hyperlipidemia    Hypertension    Melanoma ([place]) 08/17/2012   back of right arm   Mixed incontinence    Obesity    Plantar fasciitis    with significant disability   Tendonitis of foot    L, chronic foot pain    Past Surgical History:  Procedure Laterality Date   ABDOMINAL HYSTERECTOMY     fibroids, ovaries intact   BREAST EXCISIONAL BIOPSY Bilateral 1960's -1970's   benign   EYE SURGERY Bilateral 12/2015   at [place]  5/07   Tendon rupture L foot    Family History  Problem Relation Age of Onset   Heart attack Mother    Other Mother        ?thyroid problems   Lung cancer Father     Social History   Socioeconomic History   Marital status: Married    Spouse name: Not on file   Number of children: Not on file   Years of education: Not on file   Highest education level: Not on file  Occupational History   Not on file  Tobacco Use   Smoking status: Never   Smokeless tobacco: Never   Vaping Use   Vaping Use: Never used  Substance and Sexual Activity   Alcohol use: No    Alcohol/week: 0.0 standard drinks of alcohol   Drug use: No   Sexual activity: Not on file  Other Topics Concern   Not on file  Social History Narrative   Cannot exercise to chronic foot pain   Social Determinants of Health   Financial Resource Strain: Not on file  Food Insecurity: Not on file  Transportation Needs: Not on file  Physical Activity: Not on file  Stress: Not on file  Social Connections: Not on file  Intimate Partner Violence: Not on file    Outpatient Medications Prior to Visit  Medication Sig Dispense Refill   acetaminophen (TYLENOL) 325 MG tablet Take by mouth.     allopurinol (ZYLOPRIM) 300 MG tablet TAKE 1 TABLET BY MOUTH EVERY DAY 90 tablet 0   amLODipine (NORVASC) 10 MG tablet Take 1 tablet (10 mg total) by mouth daily. 90 tablet 3   apixaban (ELIQUIS) 5 MG TABS tablet Take 1 tablet (5 mg total) by mouth 2 (two) times daily. 180 tablet  1   fluticasone furoate-vilanterol (BREO ELLIPTA) 200-25 MCG/ACT AEPB USE 1 INHALATION ORALLY    DAILY 90 each 1   magnesium oxide (MAG-OX) 400 MG tablet Take 1 tablet (400 mg total) by mouth in the morning and at bedtime. 180 tablet 0   Multiple Vitamin (MULTIVITAMIN) tablet Take 2 tablets by mouth daily.      ondansetron (ZOFRAN-ODT) 4 MG disintegrating tablet Take by mouth.     senna-docusate (SENOKOT-S) 8.6-50 MG tablet Take by mouth.     oxyCODONE (OXY IR/ROXICODONE) 5 MG immediate release tablet Take 5 mg by mouth every 4 (four) hours as needed. (Patient not taking: Reported on 05/05/2022)     oxyCODONE (OXYCONTIN) 10 mg 12 hr tablet Take 1 tablet (10 mg total) by mouth in the morning, at noon, and at bedtime. (Patient not taking: Reported on 05/05/2022) 42 tablet 0   polyethylene glycol (MIRALAX / GLYCOLAX) 17 g packet Take by mouth. (Patient not taking: Reported on 05/05/2022)     albuterol (PROVENTIL HFA;VENTOLIN HFA) 108 (90 Base)  MCG/ACT inhaler Inhale 2 puffs into the lungs every 4 (four) hours as needed for wheezing or shortness of breath (cough, shortness of breath or wheezing.). (Patient not taking: Reported on 05/05/2022) 1 Inhaler 1   clobetasol ointment (TEMOVATE) 2.45 % Apply 1 Application topically 2 (two) times daily. (Patient not taking: Reported on 05/05/2022)     colchicine 0.6 MG tablet as needed. Take 2  tablets now with food and then 1 tablet twice a day with food     fluticasone (FLONASE) 50 MCG/ACT nasal spray SPRAY 2 SPRAYS INTO EACH NOSTRIL EVERY DAY (Patient not taking: Reported on 05/05/2022) 48 mL 2   guaiFENesin (MUCINEX) 600 MG 12 hr tablet Take 1,200 mg by mouth daily. (Patient not taking: Reported on 05/05/2022)     Melatonin 3 MG TBDP Take by mouth. As needed (Patient not taking: Reported on 05/05/2022)     naloxone [facility]) nasal spray 4 mg/0.1 mL  (Patient not taking: Reported on 05/05/2022)     potassium chloride (KLOR-CON) 10 MEQ tablet Take 1 tablet (10 mEq total) by mouth daily. (Patient not taking: Reported on 05/05/2022) 90 tablet 3   predniSONE (DELTASONE) 10 MG tablet Take 1 tablet by mouth daily. (Patient not taking: Reported on 05/05/2022)     simvastatin (ZOCOR) 20 MG tablet TAKE 1 TABLET BY MOUTH EVERYDAY AT BEDTIME (Patient not taking: Reported on 05/05/2022) 90 tablet 3   No facility-administered medications prior to visit.    Allergies  Allergen Reactions   Alendronate     Hair loss  Nausea Diarrhea    Lipitor [Atorvastatin]     Muscle pain   Naproxen Sodium    Bioflavonoids Rash   Naproxen Other (See Comments) and Rash    Severe indigestion         Objective:    Physical Exam Constitutional:      General: She is not in acute distress.    Appearance: Normal appearance. She is well-developed and well-groomed. She is obese. She is not ill-appearing, toxic-appearing or diaphoretic.  HENT:     Mouth/Throat:     Pharynx: No pharyngeal swelling.     Tonsils: No tonsillar  exudate.  Neck:     Thyroid: No thyroid mass.  Pulmonary:     Effort: Pulmonary effort is normal.  Abdominal:     Tenderness: There is abdominal tenderness (suprapubic).  Musculoskeletal:     Lumbar back: Normal. No tenderness.  Comments: No flank pain no CVA tenderness  Lymphadenopathy:     Cervical:     Right cervical: No superficial cervical adenopathy.    Left cervical: No superficial cervical adenopathy.  Neurological:     General: No focal deficit present.     Mental Status: She is alert and oriented to person, place, and time. Mental status is at baseline.  Psychiatric:        Mood and Affect: Mood normal.        Behavior: Behavior normal.        Thought Content: Thought content normal.        Judgment: Judgment normal.      BP 120/68 (BP Location: Left Arm, Patient Position: Sitting, Cuff Size: Normal)   Pulse 81   Temp 98.3 F (36.8 C)   Resp 16   Ht 5\' 1"  (1.549 m)   Wt 155 lb 6 oz (70.5 kg)   SpO2 97%   BMI 29.36 kg/m  Wt Readings from Last 3 Encounters:  05/05/22 155 lb 6 oz (70.5 kg)  03/15/22 141 lb (64 kg)  03/14/22 141 lb 1.5 oz (64 kg)     Health Maintenance Due  Topic Date Due   Zoster Vaccines- Shingrix (1 of 2) Never done   TETANUS/TDAP  01/29/2018   COVID-19 Vaccine (3 - Pfizer risk series) 12/24/2019   MAMMOGRAM  03/11/2022   INFLUENZA VACCINE  04/05/2022    There are no preventive care reminders to display for this patient.  Lab Results  Component Value Date   TSH 5.09 04/19/2021   Lab Results  Component Value Date   WBC 11.3 (H) 09/24/2021   HGB 13.4 09/24/2021   HCT 40.9 09/24/2021   MCV 94.7 09/24/2021   PLT 304.0 09/24/2021   Lab Results  Component Value Date   NA 136 03/15/2022   K 3.9 03/15/2022   CO2 25 03/15/2022   GLUCOSE 146 (H) 03/15/2022   BUN 19 03/15/2022   CREATININE 0.70 03/15/2022   BILITOT 0.5 03/15/2022   ALKPHOS 102 03/15/2022   AST 25 03/15/2022   ALT 33 03/15/2022   PROT 6.2 03/15/2022    ALBUMIN 3.9 03/15/2022   CALCIUM 9.9 03/15/2022   GFR 83.33 03/15/2022   Lab Results  Component Value Date   CHOL 152 02/23/2021   Lab Results  Component Value Date   HDL 56.50 02/23/2021   Lab Results  Component Value Date   LDLCALC 84 02/23/2021   Lab Results  Component Value Date   TRIG 58.0 02/23/2021   Lab Results  Component Value Date   CHOLHDL 3 02/23/2021   Lab Results  Component Value Date   HGBA1C 6.2 02/23/2021      Assessment & Plan:   Problem List Items Addressed This Visit       Genitourinary   Acute cystitis without hematuria    antbx sent to pharmacy, pt to take as directed. Encouraged increased water intake throughout the day. Urine culture/reflex pending results. Choosing to treat due to being symptomatic. If no improvement in the next 2 days pt advised to let me know.       Relevant Medications   cephALEXin (KEFLEX) 500 MG capsule     Other   Burning with urination - Primary    poct urine dip in office Urine culture ordered pending results  I evaluated the patient,  was consulted regarding plans for treatment of care, and agree with the assessment and plan per Tinnie Gens, RN,  DNP student.   Eugenia Pancoast, FNP-C        Relevant Medications   cephALEXin (KEFLEX) 500 MG capsule   Other Relevant Orders   POCT Urinalysis Dipstick (Automated) (Completed)   Urine Culture    Meds ordered this encounter  Medications   DISCONTD: cephALEXin (KEFLEX) 500 MG capsule    Sig: Take 1 capsule (500 mg total) by mouth 3 (three) times daily for 10 days.    Dispense:  30 capsule    Refill:  0    Order Specific Question:   Supervising Provider    Answer:   BEDSOLE, AMY E [[id number]]   cephALEXin (KEFLEX) 500 MG capsule    Sig: Take 1 capsule (500 mg total) by mouth 3 (three) times daily for 7 days.    Dispense:  21 capsule    Refill:  0    Order Specific Question:   Supervising Provider    Answer:   BEDSOLE, AMY E [[id number]]    Follow-up: Return if  symptoms worsen or fail to improve with pcp.    Eugenia Pancoast, FNP

## 2022-05-05 NOTE — Progress Notes (Signed)
Established Patient Office Visit  Subjective   Patient ID: Andrea Peters, female    DOB: 12/08/1945  Age: 76 y.o. MRN: [id number]  Chief Complaint  Patient presents with   Urinary Tract Infection    X 2 Weeks just got over medication     Urinary Tract Infection     Andrea Peters is a 76 year old female with past medical history of hypertension, liver lesion, subclinical hypothyroidism who presents today for burning and back pain x 1 week. She has had a urinary tract infection off and on for six months. She was in the hospital for 6 weeks with colitis and had a urinary catheter during the admission. Since then, she has been treated for UTI about four times.   Her last UTI was on 03/09/22 and she was treated with Bactrim for ten days. Does not drink a lot of water. Reports drinking 20 oz sprite daily.   She had nausea this morning took Zofran. She has had chills for two weeks. Denies any fever or vomiting. She denies any constipation, diarrhea or changes to bowel habits.  She just completed her course of prednisone and is being weaned off of OxyContin 5 mg.   Her son reports that she has been more emotional. Denies any altered mental status.    Objective:     BP 120/68 (BP Location: Left Arm, Patient Position: Sitting, Cuff Size: Normal)   Pulse 81   Temp 98.3 F (36.8 C)   Resp 16   Ht 5\' 1"  (1.549 m)   Wt 155 lb 6 oz (70.5 kg)   SpO2 97%   BMI 29.36 kg/m  BP Readings from Last 3 Encounters:  05/05/22 120/68  03/15/22 116/74  03/14/22 130/78      Physical Exam Cardiovascular:     Rate and Rhythm: Normal rate and regular rhythm.     Pulses: Normal pulses.     Heart sounds: Normal heart sounds.  Pulmonary:     Effort: Pulmonary effort is normal.     Breath sounds: Normal breath sounds.  Abdominal:     General: Bowel sounds are normal.     Tenderness: There is abdominal tenderness. There is no right CVA tenderness or left CVA tenderness.     Comments:  Pelvic tenderness  Neurological:     Mental Status: She is alert and oriented to person, place, and time.      Results for orders placed or performed in visit on 05/05/22  POCT Urinalysis Dipstick (Automated)  Result Value Ref Range   Color, UA yellow    Clarity, UA cloudy    Glucose, UA Negative Negative   Bilirubin, UA Negative    Ketones, UA trace    Spec Grav, UA >=1.030 (A) 1.010 - 1.025   Blood, UA Negative    pH, UA 5.0 5.0 - 8.0   Protein, UA Positive (A) Negative   Urobilinogen, UA 0.2 0.2 or 1.0 E.U./dL   Nitrite, UA Negative    Leukocytes, UA Negative Negative      The 10-year ASCVD risk score (Arnett DK, et al., 2019) is: 22.9%    Assessment & Plan:   Problem List Items Addressed This Visit       Genitourinary   Acute cystitis without hematuria    antbx sent to pharmacy, pt to take as directed. Encouraged increased water intake throughout the day. Urine culture/reflex pending results. Choosing to treat due to being symptomatic. If no improvement in the next  2 days pt advised to let me know.       Relevant Medications   cephALEXin (KEFLEX) 500 MG capsule     Other   Burning with urination - Primary    poct urine dip in office Urine culture ordered pending results  I evaluated the patient,  was consulted regarding plans for treatment of care, and agree with the assessment and plan per Tinnie Gens, RN, DNP student.   Eugenia Pancoast, FNP-C        Relevant Medications   cephALEXin (KEFLEX) 500 MG capsule   Other Relevant Orders   POCT Urinalysis Dipstick (Automated) (Completed)   Urine Culture    Return if symptoms worsen or fail to improve with pcp.    Tinnie Gens, BSN-RN, DNP STUDENT

## 2022-05-05 NOTE — Assessment & Plan Note (Signed)
antbx sent to pharmacy, pt to take as directed. Encouraged increased water intake throughout the day. Urine culture/reflex pending results. Choosing to treat due to being symptomatic. If no improvement in the next 2 days pt advised to let me know.

## 2022-05-05 NOTE — Patient Instructions (Signed)
It was a pleasure seeing you today.   You were found to have a urinary tract infection, you have been prescribed an antibiotic to your preferred pharmacy. Please start antibiotic today as directed.   We are sending your urine for a culture to make sure you do not have a resistant bacteria. We will call you if we need to change your medications.   Please make sure you are drinking plenty of fluids over the next few days.  If your symptoms do not improve over the next 5-7 days, or if they worsen, please let us know. Please also let us know if you have worsening back pain, fevers, chills, or body aches.   Regards,   Ranveer Wahlstrom  

## 2022-05-06 LAB — URINE CULTURE
MICRO NUMBER:: 13858627
SPECIMEN QUALITY:: ADEQUATE

## 2022-05-10 DIAGNOSIS — G8929 Other chronic pain: Secondary | ICD-10-CM | POA: Diagnosis not present

## 2022-05-10 DIAGNOSIS — M109 Gout, unspecified: Secondary | ICD-10-CM | POA: Diagnosis not present

## 2022-05-10 DIAGNOSIS — S32010D Wedge compression fracture of first lumbar vertebra, subsequent encounter for fracture with routine healing: Secondary | ICD-10-CM | POA: Diagnosis not present

## 2022-05-10 DIAGNOSIS — N3946 Mixed incontinence: Secondary | ICD-10-CM | POA: Diagnosis not present

## 2022-05-10 DIAGNOSIS — I10 Essential (primary) hypertension: Secondary | ICD-10-CM | POA: Diagnosis not present

## 2022-05-10 DIAGNOSIS — J45909 Unspecified asthma, uncomplicated: Secondary | ICD-10-CM | POA: Diagnosis not present

## 2022-05-11 ENCOUNTER — Telehealth: Payer: Self-pay | Admitting: Family Medicine

## 2022-05-11 NOTE — Telephone Encounter (Signed)
Home Health verbal orders Caller Name:Connie Agency Name: [facility]  Callback number: [phone]  Requesting OT/PT/Skilled nursing/Social Work/Speech: OT  Reason:  Frequency: once week for 8 wks  Please forward to [facility] pool or providers CMA

## 2022-05-11 NOTE — Telephone Encounter (Signed)
VO given to [place]

## 2022-05-11 NOTE — Telephone Encounter (Signed)
Please ok that verbal order  

## 2022-05-12 DIAGNOSIS — M109 Gout, unspecified: Secondary | ICD-10-CM | POA: Diagnosis not present

## 2022-05-12 DIAGNOSIS — J45909 Unspecified asthma, uncomplicated: Secondary | ICD-10-CM | POA: Diagnosis not present

## 2022-05-12 DIAGNOSIS — Z7901 Long term (current) use of anticoagulants: Secondary | ICD-10-CM | POA: Diagnosis not present

## 2022-05-12 DIAGNOSIS — E785 Hyperlipidemia, unspecified: Secondary | ICD-10-CM | POA: Diagnosis not present

## 2022-05-12 DIAGNOSIS — I10 Essential (primary) hypertension: Secondary | ICD-10-CM | POA: Diagnosis not present

## 2022-05-12 DIAGNOSIS — Z7952 Long term (current) use of systemic steroids: Secondary | ICD-10-CM | POA: Diagnosis not present

## 2022-05-12 DIAGNOSIS — Z85820 Personal history of malignant melanoma of skin: Secondary | ICD-10-CM | POA: Diagnosis not present

## 2022-05-12 DIAGNOSIS — Z86711 Personal history of pulmonary embolism: Secondary | ICD-10-CM | POA: Diagnosis not present

## 2022-05-12 DIAGNOSIS — G8929 Other chronic pain: Secondary | ICD-10-CM | POA: Diagnosis not present

## 2022-05-12 DIAGNOSIS — E669 Obesity, unspecified: Secondary | ICD-10-CM | POA: Diagnosis not present

## 2022-05-12 DIAGNOSIS — Z86718 Personal history of other venous thrombosis and embolism: Secondary | ICD-10-CM | POA: Diagnosis not present

## 2022-05-12 DIAGNOSIS — S32010D Wedge compression fracture of first lumbar vertebra, subsequent encounter for fracture with routine healing: Secondary | ICD-10-CM | POA: Diagnosis not present

## 2022-05-12 DIAGNOSIS — N3946 Mixed incontinence: Secondary | ICD-10-CM | POA: Diagnosis not present

## 2022-05-12 DIAGNOSIS — K5900 Constipation, unspecified: Secondary | ICD-10-CM | POA: Diagnosis not present

## 2022-05-12 DIAGNOSIS — K219 Gastro-esophageal reflux disease without esophagitis: Secondary | ICD-10-CM | POA: Diagnosis not present

## 2022-05-16 DIAGNOSIS — I10 Essential (primary) hypertension: Secondary | ICD-10-CM | POA: Diagnosis not present

## 2022-05-16 DIAGNOSIS — G8929 Other chronic pain: Secondary | ICD-10-CM | POA: Diagnosis not present

## 2022-05-16 DIAGNOSIS — N3946 Mixed incontinence: Secondary | ICD-10-CM | POA: Diagnosis not present

## 2022-05-16 DIAGNOSIS — S32010D Wedge compression fracture of first lumbar vertebra, subsequent encounter for fracture with routine healing: Secondary | ICD-10-CM | POA: Diagnosis not present

## 2022-05-16 DIAGNOSIS — J45909 Unspecified asthma, uncomplicated: Secondary | ICD-10-CM | POA: Diagnosis not present

## 2022-05-16 DIAGNOSIS — M109 Gout, unspecified: Secondary | ICD-10-CM | POA: Diagnosis not present

## 2022-05-18 ENCOUNTER — Other Ambulatory Visit: Payer: Self-pay | Admitting: Family Medicine

## 2022-05-18 NOTE — Telephone Encounter (Signed)
Last progress note said she found this medicine too sedating (I think)  Does she want to try again?

## 2022-05-18 NOTE — Telephone Encounter (Signed)
No on med list anymore but last filled on 11/10/21 #90 with 1 refill, last f/u was on 03/15/22

## 2022-05-19 DIAGNOSIS — I10 Essential (primary) hypertension: Secondary | ICD-10-CM | POA: Diagnosis not present

## 2022-05-19 DIAGNOSIS — G8929 Other chronic pain: Secondary | ICD-10-CM | POA: Diagnosis not present

## 2022-05-19 DIAGNOSIS — N3946 Mixed incontinence: Secondary | ICD-10-CM | POA: Diagnosis not present

## 2022-05-19 DIAGNOSIS — J45909 Unspecified asthma, uncomplicated: Secondary | ICD-10-CM | POA: Diagnosis not present

## 2022-05-19 DIAGNOSIS — S32010D Wedge compression fracture of first lumbar vertebra, subsequent encounter for fracture with routine healing: Secondary | ICD-10-CM | POA: Diagnosis not present

## 2022-05-19 DIAGNOSIS — M109 Gout, unspecified: Secondary | ICD-10-CM | POA: Diagnosis not present

## 2022-05-20 NOTE — Telephone Encounter (Signed)
What is her current pain medication status? Please do a med review with her and review what she is not taking- she was on narcotic pain med for cancer and I think her oncologist weaned her off.     Thanks in advance  I need to know before I suggest anything further  I am out of the office until wednesday

## 2022-05-20 NOTE — Telephone Encounter (Signed)
Med d/c it was an auto refill and pt isn't taking med.  **Pt's son did have another question**  Pt's arthritis is flaring up and she use to take meloxicam but med was d/c due to pt being on eliquis. Son said pt is only taking tylenol right now but it's not helping the pain at all and pt is taking 2000 mg daily of tylenol. Pt son asked if PCP can prescribe or recommend something stronger to help pt with pain given she's on Liquid. Please advise.

## 2022-05-21 ENCOUNTER — Encounter: Payer: Self-pay | Admitting: Family Medicine

## 2022-05-23 NOTE — Telephone Encounter (Addendum)
I spoke with Hilliard Clark (DPR signed); Hilliard Clark said when he went over to ck on his mom this morning pts breathing is better. Hilliard Clark does not think pt needs to be seen at this time.  Hilliard Clark said 2 points he would like addressed by Dr Glori Bickers Hilliard Clark does not want sent to a different provider) on her return is; 1) how much magnesium should pt be taking and 2) is there anything else pt can take for generalized joint pain besides Tylenol. Hilliard Clark said since pt is on blood thinner pt is more limited on what med pt can take. Hilliard Clark will wait to hear back and if pt condition changes or worsens Hilliard Clark will contact [facility] and UC & ED precautions given and Hilliard Clark voiced understanding. Sending note to DR Glori Bickers and [place] CMA.[facility]

## 2022-05-23 NOTE — Telephone Encounter (Signed)
See my chart message

## 2022-05-23 NOTE — Telephone Encounter (Signed)
Son said pt isn't on oxycodone anymore she's been off of it for about a week and he thinks that's why her pain is worsening now. She had mentioned maybe taking Celebrex but pt's son said he will ask her about Tramadol 1st and then respond to Dr. Hanley Hays to let her know what pt thinks about tramadol

## 2022-05-24 DIAGNOSIS — G8929 Other chronic pain: Secondary | ICD-10-CM | POA: Diagnosis not present

## 2022-05-24 DIAGNOSIS — M109 Gout, unspecified: Secondary | ICD-10-CM | POA: Diagnosis not present

## 2022-05-24 DIAGNOSIS — J45909 Unspecified asthma, uncomplicated: Secondary | ICD-10-CM | POA: Diagnosis not present

## 2022-05-24 DIAGNOSIS — S32010D Wedge compression fracture of first lumbar vertebra, subsequent encounter for fracture with routine healing: Secondary | ICD-10-CM | POA: Diagnosis not present

## 2022-05-24 DIAGNOSIS — I10 Essential (primary) hypertension: Secondary | ICD-10-CM | POA: Diagnosis not present

## 2022-05-24 DIAGNOSIS — N3946 Mixed incontinence: Secondary | ICD-10-CM | POA: Diagnosis not present

## 2022-05-24 MED ORDER — TRAMADOL HCL 50 MG PO TABS: 50.0000 mg | ORAL_TABLET | Freq: Two times a day (BID) | ORAL | 0 refills | Status: DC | PRN

## 2022-05-26 DIAGNOSIS — G8929 Other chronic pain: Secondary | ICD-10-CM | POA: Diagnosis not present

## 2022-05-26 DIAGNOSIS — J45909 Unspecified asthma, uncomplicated: Secondary | ICD-10-CM | POA: Diagnosis not present

## 2022-05-26 DIAGNOSIS — S32010D Wedge compression fracture of first lumbar vertebra, subsequent encounter for fracture with routine healing: Secondary | ICD-10-CM | POA: Diagnosis not present

## 2022-05-26 DIAGNOSIS — I10 Essential (primary) hypertension: Secondary | ICD-10-CM | POA: Diagnosis not present

## 2022-05-26 DIAGNOSIS — M109 Gout, unspecified: Secondary | ICD-10-CM | POA: Diagnosis not present

## 2022-05-26 DIAGNOSIS — N3946 Mixed incontinence: Secondary | ICD-10-CM | POA: Diagnosis not present

## 2022-05-31 DIAGNOSIS — J45909 Unspecified asthma, uncomplicated: Secondary | ICD-10-CM | POA: Diagnosis not present

## 2022-05-31 DIAGNOSIS — S32010D Wedge compression fracture of first lumbar vertebra, subsequent encounter for fracture with routine healing: Secondary | ICD-10-CM | POA: Diagnosis not present

## 2022-05-31 DIAGNOSIS — N3946 Mixed incontinence: Secondary | ICD-10-CM | POA: Diagnosis not present

## 2022-05-31 DIAGNOSIS — M109 Gout, unspecified: Secondary | ICD-10-CM | POA: Diagnosis not present

## 2022-05-31 DIAGNOSIS — I10 Essential (primary) hypertension: Secondary | ICD-10-CM | POA: Diagnosis not present

## 2022-05-31 DIAGNOSIS — G8929 Other chronic pain: Secondary | ICD-10-CM | POA: Diagnosis not present

## 2022-06-02 DIAGNOSIS — M109 Gout, unspecified: Secondary | ICD-10-CM | POA: Diagnosis not present

## 2022-06-02 DIAGNOSIS — N3946 Mixed incontinence: Secondary | ICD-10-CM | POA: Diagnosis not present

## 2022-06-02 DIAGNOSIS — S32010D Wedge compression fracture of first lumbar vertebra, subsequent encounter for fracture with routine healing: Secondary | ICD-10-CM | POA: Diagnosis not present

## 2022-06-02 DIAGNOSIS — J45909 Unspecified asthma, uncomplicated: Secondary | ICD-10-CM | POA: Diagnosis not present

## 2022-06-02 DIAGNOSIS — I10 Essential (primary) hypertension: Secondary | ICD-10-CM | POA: Diagnosis not present

## 2022-06-02 DIAGNOSIS — G8929 Other chronic pain: Secondary | ICD-10-CM | POA: Diagnosis not present

## 2022-06-06 DIAGNOSIS — I10 Essential (primary) hypertension: Secondary | ICD-10-CM | POA: Diagnosis not present

## 2022-06-06 DIAGNOSIS — J45909 Unspecified asthma, uncomplicated: Secondary | ICD-10-CM | POA: Diagnosis not present

## 2022-06-06 DIAGNOSIS — G8929 Other chronic pain: Secondary | ICD-10-CM | POA: Diagnosis not present

## 2022-06-06 DIAGNOSIS — N3946 Mixed incontinence: Secondary | ICD-10-CM | POA: Diagnosis not present

## 2022-06-06 DIAGNOSIS — M109 Gout, unspecified: Secondary | ICD-10-CM | POA: Diagnosis not present

## 2022-06-06 DIAGNOSIS — S32010D Wedge compression fracture of first lumbar vertebra, subsequent encounter for fracture with routine healing: Secondary | ICD-10-CM | POA: Diagnosis not present

## 2022-06-07 DIAGNOSIS — J45909 Unspecified asthma, uncomplicated: Secondary | ICD-10-CM | POA: Diagnosis not present

## 2022-06-07 DIAGNOSIS — S32010D Wedge compression fracture of first lumbar vertebra, subsequent encounter for fracture with routine healing: Secondary | ICD-10-CM | POA: Diagnosis not present

## 2022-06-07 DIAGNOSIS — M109 Gout, unspecified: Secondary | ICD-10-CM | POA: Diagnosis not present

## 2022-06-07 DIAGNOSIS — I10 Essential (primary) hypertension: Secondary | ICD-10-CM | POA: Diagnosis not present

## 2022-06-07 DIAGNOSIS — N3946 Mixed incontinence: Secondary | ICD-10-CM | POA: Diagnosis not present

## 2022-06-07 DIAGNOSIS — G8929 Other chronic pain: Secondary | ICD-10-CM | POA: Diagnosis not present

## 2022-06-11 DIAGNOSIS — M109 Gout, unspecified: Secondary | ICD-10-CM | POA: Diagnosis not present

## 2022-06-11 DIAGNOSIS — K5900 Constipation, unspecified: Secondary | ICD-10-CM | POA: Diagnosis not present

## 2022-06-11 DIAGNOSIS — S32010D Wedge compression fracture of first lumbar vertebra, subsequent encounter for fracture with routine healing: Secondary | ICD-10-CM | POA: Diagnosis not present

## 2022-06-11 DIAGNOSIS — N3946 Mixed incontinence: Secondary | ICD-10-CM | POA: Diagnosis not present

## 2022-06-11 DIAGNOSIS — J45909 Unspecified asthma, uncomplicated: Secondary | ICD-10-CM | POA: Diagnosis not present

## 2022-06-11 DIAGNOSIS — E669 Obesity, unspecified: Secondary | ICD-10-CM | POA: Diagnosis not present

## 2022-06-11 DIAGNOSIS — I10 Essential (primary) hypertension: Secondary | ICD-10-CM | POA: Diagnosis not present

## 2022-06-11 DIAGNOSIS — Z7901 Long term (current) use of anticoagulants: Secondary | ICD-10-CM | POA: Diagnosis not present

## 2022-06-11 DIAGNOSIS — Z7952 Long term (current) use of systemic steroids: Secondary | ICD-10-CM | POA: Diagnosis not present

## 2022-06-11 DIAGNOSIS — Z86711 Personal history of pulmonary embolism: Secondary | ICD-10-CM | POA: Diagnosis not present

## 2022-06-11 DIAGNOSIS — E785 Hyperlipidemia, unspecified: Secondary | ICD-10-CM | POA: Diagnosis not present

## 2022-06-11 DIAGNOSIS — K219 Gastro-esophageal reflux disease without esophagitis: Secondary | ICD-10-CM | POA: Diagnosis not present

## 2022-06-11 DIAGNOSIS — G8929 Other chronic pain: Secondary | ICD-10-CM | POA: Diagnosis not present

## 2022-06-11 DIAGNOSIS — Z85820 Personal history of malignant melanoma of skin: Secondary | ICD-10-CM | POA: Diagnosis not present

## 2022-06-11 DIAGNOSIS — Z86718 Personal history of other venous thrombosis and embolism: Secondary | ICD-10-CM | POA: Diagnosis not present

## 2022-06-15 DIAGNOSIS — J45909 Unspecified asthma, uncomplicated: Secondary | ICD-10-CM | POA: Diagnosis not present

## 2022-06-15 DIAGNOSIS — G8929 Other chronic pain: Secondary | ICD-10-CM | POA: Diagnosis not present

## 2022-06-15 DIAGNOSIS — S32010D Wedge compression fracture of first lumbar vertebra, subsequent encounter for fracture with routine healing: Secondary | ICD-10-CM | POA: Diagnosis not present

## 2022-06-15 DIAGNOSIS — M109 Gout, unspecified: Secondary | ICD-10-CM | POA: Diagnosis not present

## 2022-06-15 DIAGNOSIS — N3946 Mixed incontinence: Secondary | ICD-10-CM | POA: Diagnosis not present

## 2022-06-15 DIAGNOSIS — I10 Essential (primary) hypertension: Secondary | ICD-10-CM | POA: Diagnosis not present

## 2022-06-22 DIAGNOSIS — J45909 Unspecified asthma, uncomplicated: Secondary | ICD-10-CM | POA: Diagnosis not present

## 2022-06-22 DIAGNOSIS — G8929 Other chronic pain: Secondary | ICD-10-CM | POA: Diagnosis not present

## 2022-06-22 DIAGNOSIS — N3946 Mixed incontinence: Secondary | ICD-10-CM | POA: Diagnosis not present

## 2022-06-22 DIAGNOSIS — I10 Essential (primary) hypertension: Secondary | ICD-10-CM | POA: Diagnosis not present

## 2022-06-22 DIAGNOSIS — M109 Gout, unspecified: Secondary | ICD-10-CM | POA: Diagnosis not present

## 2022-06-22 DIAGNOSIS — S32010D Wedge compression fracture of first lumbar vertebra, subsequent encounter for fracture with routine healing: Secondary | ICD-10-CM | POA: Diagnosis not present

## 2022-06-23 DIAGNOSIS — S32010D Wedge compression fracture of first lumbar vertebra, subsequent encounter for fracture with routine healing: Secondary | ICD-10-CM | POA: Diagnosis not present

## 2022-06-23 DIAGNOSIS — M109 Gout, unspecified: Secondary | ICD-10-CM | POA: Diagnosis not present

## 2022-06-23 DIAGNOSIS — N3946 Mixed incontinence: Secondary | ICD-10-CM | POA: Diagnosis not present

## 2022-06-23 DIAGNOSIS — I10 Essential (primary) hypertension: Secondary | ICD-10-CM | POA: Diagnosis not present

## 2022-06-23 DIAGNOSIS — G8929 Other chronic pain: Secondary | ICD-10-CM | POA: Diagnosis not present

## 2022-06-23 DIAGNOSIS — J45909 Unspecified asthma, uncomplicated: Secondary | ICD-10-CM | POA: Diagnosis not present

## 2022-06-27 DIAGNOSIS — S32010D Wedge compression fracture of first lumbar vertebra, subsequent encounter for fracture with routine healing: Secondary | ICD-10-CM | POA: Diagnosis not present

## 2022-06-27 DIAGNOSIS — G8929 Other chronic pain: Secondary | ICD-10-CM | POA: Diagnosis not present

## 2022-06-27 DIAGNOSIS — J45909 Unspecified asthma, uncomplicated: Secondary | ICD-10-CM | POA: Diagnosis not present

## 2022-06-27 DIAGNOSIS — I10 Essential (primary) hypertension: Secondary | ICD-10-CM | POA: Diagnosis not present

## 2022-06-27 DIAGNOSIS — N3946 Mixed incontinence: Secondary | ICD-10-CM | POA: Diagnosis not present

## 2022-06-27 DIAGNOSIS — M109 Gout, unspecified: Secondary | ICD-10-CM | POA: Diagnosis not present

## 2022-06-28 ENCOUNTER — Ambulatory Visit (INDEPENDENT_AMBULATORY_CARE_PROVIDER_SITE_OTHER): Payer: Medicare Other

## 2022-06-28 VITALS — Ht 61.0 in | Wt 150.0 lb

## 2022-06-28 DIAGNOSIS — Z0000 Encounter for general adult medical examination without abnormal findings: Secondary | ICD-10-CM | POA: Diagnosis not present

## 2022-06-28 NOTE — Progress Notes (Signed)
I connected with Andrea Peters today by telephone and verified that I am speaking with the correct person using two identifiers. Location patient: home Location provider: work Persons participating in the virtual visit: Andrea Asal LPN.   I discussed the limitations, risks, security and privacy concerns of performing an evaluation and management service by telephone and the availability of in person appointments. I also discussed with the patient that there may be a patient responsible charge related to this service. The patient expressed understanding and verbally consented to this telephonic visit.    Interactive audio and video telecommunications were attempted between this provider and patient, however failed, due to patient having technical difficulties OR patient did not have access to video capability.  We continued and completed visit with audio only.     Vital signs may be patient reported or missing.  Subjective:   Andrea Peters is a 76 y.o. female who presents for Medicare Annual (Subsequent) preventive examination.  Review of Systems     Cardiac Risk Factors include: advanced age (>48men, >6 women);hypertension     Objective:    Today's Vitals   06/28/22 1041  Weight: 150 lb (68 kg)  Height: 5\' 1"  (1.549 m)   Body mass index is 28.34 kg/m.     06/28/2022   10:48 AM 03/14/2022   11:24 AM 10/23/2018   12:37 PM 10/18/2017   11:56 AM 10/17/2016    2:53 PM  Advanced Directives  Does Patient Have a Medical Advance Directive? Yes Yes Yes Yes Yes  Type of Paramedic of [place];Living will [place];Living will [place];Living will [place];Living will [place];Living will  Does patient want to make changes to medical advance directive?  No - Patient declined     Copy of [place] in Chart? No - copy requested No - copy  requested  No - copy requested No - copy requested  Would patient like information on creating a medical advance directive?  No - Patient declined       Current Medications (verified) Outpatient Encounter Medications as of 06/28/2022  Medication Sig   acetaminophen (TYLENOL) 325 MG tablet Take by mouth.   allopurinol (ZYLOPRIM) 300 MG tablet TAKE 1 TABLET BY MOUTH EVERY DAY   amLODipine (NORVASC) 10 MG tablet Take 1 tablet (10 mg total) by mouth daily.   apixaban (ELIQUIS) 5 MG TABS tablet Take 1 tablet (5 mg total) by mouth 2 (two) times daily.   fluticasone furoate-vilanterol (BREO ELLIPTA) 200-25 MCG/ACT AEPB USE 1 INHALATION ORALLY    DAILY   magnesium oxide (MAG-OX) 400 MG tablet Take 1 tablet (400 mg total) by mouth in the morning and at bedtime.   Multiple Vitamin (MULTIVITAMIN) tablet Take 2 tablets by mouth daily.    ondansetron (ZOFRAN-ODT) 4 MG disintegrating tablet Take by mouth.   senna-docusate (SENOKOT-S) 8.6-50 MG tablet Take by mouth.   traMADol (ULTRAM) 50 MG tablet Take 1 tablet (50 mg total) by mouth every 12 (twelve) hours as needed.   No facility-administered encounter medications on file as of 06/28/2022.    Allergies (verified) Alendronate, Lipitor [atorvastatin], Naproxen sodium, Bioflavonoids, and Naproxen   History: Past Medical History:  Diagnosis Date   Allergic rhinitis    CHOLELITHIASIS, HX OF 11/17/2010   Qualifier: Diagnosis of  By: Glori Bickers MD, Carmell [place]    Dysplastic nevus 02/06/2013   R flank - moderate   Hyperglycemia  Hyperlipidemia    Hypertension    Melanoma ([place]) 08/17/2012   back of right arm   Mixed incontinence    Obesity    Plantar fasciitis    with significant disability   Tendonitis of foot    L, chronic foot pain   Past Surgical History:  Procedure Laterality Date   ABDOMINAL HYSTERECTOMY     fibroids, ovaries intact   BREAST EXCISIONAL BIOPSY Bilateral 1960's -1970's   benign   EYE SURGERY Bilateral 12/2015   at [place]  5/07   Tendon rupture L foot   Family History  Problem Relation Age of Onset   Heart attack Mother    Other Mother        ?thyroid problems   Lung cancer Father    Social History   Socioeconomic History   Marital status: Married    Spouse name: Not on file   Number of children: Not on file   Years of education: Not on file   Highest education level: Not on file  Occupational History   Not on file  Tobacco Use   Smoking status: Never   Smokeless tobacco: Never  Vaping Use   Vaping Use: Never used  Substance and Sexual Activity   Alcohol use: No    Alcohol/week: 0.0 standard drinks of alcohol   Drug use: No   Sexual activity: Not on file  Other Topics Concern   Not on file  Social History Narrative   Cannot exercise to chronic foot pain   Social Determinants of Health   Financial Resource Strain: Low Risk  (06/28/2022)   Overall Financial Resource Strain (CARDIA)    Difficulty of Paying Living Expenses: Not hard at all  Food Insecurity: No Food Insecurity (06/28/2022)   Hunger Vital Sign    Worried About Running Out of Food in the Last Year: Never true    Ran Out of Food in the Last Year: Never true  Transportation Needs: No Transportation Needs (06/28/2022)   PRAPARE - Hydrologist (Medical): No    Lack of Transportation (Non-Medical): No  Physical Activity: Sufficiently Active (06/28/2022)   Exercise Vital Sign    Days of Exercise per Week: 7 days    Minutes of Exercise per Session: 30 min  Stress: No Stress Concern Present (06/28/2022)   [place]    Feeling of Stress : Not at all  Social Connections: Not on file    Tobacco Counseling Counseling given: Not Answered   Clinical Intake:  Pre-visit preparation completed: Yes  Pain : No/denies pain     Nutritional Status: BMI 25 -29 Overweight Nutritional Risks: None Diabetes: No  How  often do you need to have someone help you when you read instructions, pamphlets, or other written materials from your doctor or pharmacy?: 1 - Never What is the last grade level you completed in school?: 12th grade  Diabetic? no  Interpreter Needed?: No  Information entered by :: NAllen LPN   Activities of Daily Living    06/28/2022   10:50 AM  In your present state of health, do you have any difficulty performing the following activities:  Hearing? 1  Comment can't hear out of one ear  Vision? 0  Difficulty concentrating or making decisions? 0  Walking or climbing stairs? 1  Dressing or bathing? 1  Doing errands, shopping? 1  Preparing Food and eating ? N  Using  the Toilet? N  In the past six months, have you accidently leaked urine? Y  Do you have problems with loss of bowel control? N  Managing your Medications? Y  Managing your Finances? N  Housekeeping or managing your Housekeeping? Y    Patient Care Team: Tower, Wynelle Fanny, MD as PCP - Erma Pinto, MD as Referring Physician (Ophthalmology) Andi Devon, MD as Referring Physician (General Surgery) Brendolyn Patty, MD as Consulting Physician (Dermatology) Laverle Hobby, MD as Consulting Physician (Pulmonary Disease)  Indicate any recent Medical Services you may have received from other than [facility] providers in the past year (date may be approximate).     Assessment:   This is a routine wellness examination for Andrea Peters.  Hearing/Vision screen Vision Screening - Comments:: [facility]  Dietary issues and exercise activities discussed: Current Exercise Habits: Home exercise routine, Type of exercise: walking;stretching, Time (Minutes): 30, Frequency (Times/Week): 7, Weekly Exercise (Minutes/Week): 210   Goals Addressed             This Visit's Progress    Patient Stated       06/28/2022, wants to get rid of wheelchair and wants to get stronger       Depression Screen     06/28/2022   10:50 AM 03/02/2021   11:00 AM 05/12/2020   10:36 AM 11/05/2019   11:28 AM 10/23/2018    2:46 PM 10/18/2017   11:22 AM 10/17/2016    2:53 PM  PHQ 2/9 Scores  PHQ - 2 Score 0 0 0 0 0 0 0  PHQ- 9 Score    1 0 0     Fall Risk    06/28/2022   10:49 AM 03/09/2022   12:23 PM 03/02/2021   11:00 AM 05/12/2020   10:35 AM 11/05/2019   10:13 AM  Fall Risk   Falls in the past year? 1 0 0 0 1  Comment legs gave out      Number falls in past yr: 0 0  0 1  Injury with Fall? 0 0   0  Risk for fall due to : Medication side effect;Impaired mobility;Impaired balance/gait      Follow up Falls evaluation completed;Education provided;Falls prevention discussed  Falls evaluation completed Falls evaluation completed Falls evaluation completed    FALL RISK PREVENTION PERTAINING TO THE HOME:  Any stairs in or around the home? Yes  If so, are there any without handrails? No  Home free of loose throw rugs in walkways, pet beds, electrical cords, etc? Yes  Adequate lighting in your home to reduce risk of falls? Yes   ASSISTIVE DEVICES UTILIZED TO PREVENT FALLS:  Life alert? No  Use of a cane, walker or w/c? Yes  Grab bars in the bathroom? Yes  Shower chair or bench in shower? Yes  Elevated toilet seat or a handicapped toilet? Yes   TIMED UP AND GO:  Was the test performed? No .      Cognitive Function:    10/23/2018    2:52 PM 10/18/2017   11:22 AM 10/17/2016    3:00 PM  MMSE - Mini Mental State Exam  Orientation to time 5 5 5   Orientation to Place 5 5 5   Registration 3 3 3   Attention/ Calculation 0 0 0  Recall 3 3 3   Language- name 2 objects 0 0 0  Language- repeat 1 1 1   Language- follow 3 step command 3 3 3   Language- read & follow  direction 0 0 0  Write a sentence 0 0 0  Copy design 0 0 0  Total score 20 20 20         06/28/2022   10:53 AM  6CIT Screen  What Year? 0 points  What month? 0 points  What time? 0 points  Count back from 20 0 points  Months in reverse 0  points  Repeat phrase 2 points  Total Score 2 points    Immunizations Immunization History  Administered Date(s) Administered   Fluad Quad(high Dose 65+) 05/12/2020   Influenza,inj,Quad PF,6+ Mos 10/15/2014, 10/19/2015, 08/09/2016, 08/17/2017, 10/04/2018   PFIZER(Purple Top)SARS-COV-2 Vaccination 11/03/2019, 11/26/2019   Pneumococcal Conjugate-13 10/15/2014   Pneumococcal Polysaccharide-23 03/22/2010   Td 01/13/1998, 01/30/2008   Zoster, Live 02/05/2008    TDAP status: Due, Education has been provided regarding the importance of this vaccine. Advised may receive this vaccine at local pharmacy or Health Dept. Aware to provide a copy of the vaccination record if obtained from local pharmacy or Health Dept. Verbalized acceptance and understanding.  Flu Vaccine status: Due, Education has been provided regarding the importance of this vaccine. Advised may receive this vaccine at local pharmacy or Health Dept. Aware to provide a copy of the vaccination record if obtained from local pharmacy or Health Dept. Verbalized acceptance and understanding.  Pneumococcal vaccine status: Up to date  Covid-19 vaccine status: Completed vaccines  Qualifies for Shingles Vaccine? Yes   Zostavax completed Yes   Shingrix Completed?: No.    Education has been provided regarding the importance of this vaccine. Patient has been advised to call insurance company to determine out of pocket expense if they have not yet received this vaccine. Advised may also receive vaccine at local pharmacy or Health Dept. Verbalized acceptance and understanding.  Screening Tests Health Maintenance  Topic Date Due   Medicare Annual Wellness (AWV)  Never done   Zoster Vaccines- Shingrix (1 of 2) Never done   TETANUS/TDAP  01/29/2018   COVID-19 Vaccine (3 - Pfizer risk series) 12/24/2019   MAMMOGRAM  03/11/2022   INFLUENZA VACCINE  04/05/2022   Pneumonia Vaccine 58+ Years old  Completed   DEXA SCAN  Completed   Hepatitis C  Screening  Completed   HPV VACCINES  Aged Out   COLONOSCOPY (Pts 45-32yrs Insurance coverage will need to be confirmed)  Discontinued    Health Maintenance  Health Maintenance Due  Topic Date Due   Medicare Annual Wellness (AWV)  Never done   Zoster Vaccines- Shingrix (1 of 2) Never done   TETANUS/TDAP  01/29/2018   COVID-19 Vaccine (3 - Pfizer risk series) 12/24/2019   MAMMOGRAM  03/11/2022   INFLUENZA VACCINE  04/05/2022    Colorectal cancer screening: No longer required.   Mammogram status: patient to schedule  Bone Density status: Completed 01/13/2020.   Lung Cancer Screening: (Low Dose CT Chest recommended if Age 64-80 years, 30 pack-year currently smoking OR have quit w/in 15years.) does not qualify.   Lung Cancer Screening Referral: no  Additional Screening:  Hepatitis C Screening: does qualify; Completed 10/17/2016  Vision Screening: Recommended annual ophthalmology exams for early detection of glaucoma and other disorders of the eye. Is the patient up to date with their annual eye exam?  Yes  Who is the provider or what is the name of the office in which the patient attends annual eye exams? [facility] If pt is not established with a provider, would they like to be referred to a provider to establish  care? No .   Dental Screening: Recommended annual dental exams for proper oral hygiene  Community Resource Referral / Chronic Care Management: CRR required this visit?  No   CCM required this visit?  No      Plan:     I have personally reviewed and noted the following in the patient's chart:   Medical and social history Use of alcohol, tobacco or illicit drugs  Current medications and supplements including opioid prescriptions. Patient is not currently taking opioid prescriptions. Functional ability and status Nutritional status Physical activity Advanced directives List of other physicians Hospitalizations, surgeries, and ER visits in previous 12  months Vitals Screenings to include cognitive, depression, and falls Referrals and appointments  In addition, I have reviewed and discussed with patient certain preventive protocols, quality metrics, and best practice recommendations. A written personalized care plan for preventive services as well as general preventive health recommendations were provided to patient.     Kellie Simmering, LPN   [id number]   Nurse Notes: none  Due to this being a virtual visit, the after visit summary with patients personalized plan was offered to patient via mail or my-chart. Patient would like to access on my-chart

## 2022-06-28 NOTE — Patient Instructions (Signed)
Andrea Peters , Thank you for taking time to come for your Medicare Wellness Visit. I appreciate your ongoing commitment to your health goals. Please review the following plan we discussed and let me know if I can assist you in the future.   Screening recommendations/referrals: Colonoscopy: not required Mammogram: patient to schedule Bone Density: completed 01/13/2020 Recommended yearly ophthalmology/optometry visit for glaucoma screening and checkup Recommended yearly dental visit for hygiene and checkup  Vaccinations: Influenza vaccine: due Pneumococcal vaccine: completed 10/15/2014 Tdap vaccine: due Shingles vaccine: discussed   Covid-19: 11/26/2019, 11/03/2019  Advanced directives: Please bring a copy of your POA (Power of Attorney) and/or Living Will to your next appointment.   Conditions/risks identified: none  Next appointment: Follow up in one year for your annual wellness visit    Preventive Care 65 Years and Older, Female Preventive care refers to lifestyle choices and visits with your health care provider that can promote health and wellness. What does preventive care include? A yearly physical exam. This is also called an annual well check. Dental exams once or twice a year. Routine eye exams. Ask your health care provider how often you should have your eyes checked. Personal lifestyle choices, including: Daily care of your teeth and gums. Regular physical activity. Eating a healthy diet. Avoiding tobacco and drug use. Limiting alcohol use. Practicing safe sex. Taking low-dose aspirin every day. Taking vitamin and mineral supplements as recommended by your health care provider. What happens during an annual well check? The services and screenings done by your health care provider during your annual well check will depend on your age, overall health, lifestyle risk factors, and family history of disease. Counseling  Your health care provider may ask you questions about  your: Alcohol use. Tobacco use. Drug use. Emotional well-being. Home and relationship well-being. Sexual activity. Eating habits. History of falls. Memory and ability to understand (cognition). Work and work Statistician. Reproductive health. Screening  You may have the following tests or measurements: Height, weight, and BMI. Blood pressure. Lipid and cholesterol levels. These may be checked every 5 years, or more frequently if you are over 78 years old. Skin check. Lung cancer screening. You may have this screening every year starting at age 14 if you have a 30-pack-year history of smoking and currently smoke or have quit within the past 15 years. Fecal occult blood test (FOBT) of the stool. You may have this test every year starting at age 31. Flexible sigmoidoscopy or colonoscopy. You may have a sigmoidoscopy every 5 years or a colonoscopy every 10 years starting at age 84. Hepatitis C blood test. Hepatitis B blood test. Sexually transmitted disease (STD) testing. Diabetes screening. This is done by checking your blood sugar (glucose) after you have not eaten for a while (fasting). You may have this done every 1-3 years. Bone density scan. This is done to screen for osteoporosis. You may have this done starting at age 18. Mammogram. This may be done every 1-2 years. Talk to your health care provider about how often you should have regular mammograms. Talk with your health care provider about your test results, treatment options, and if necessary, the need for more tests. Vaccines  Your health care provider may recommend certain vaccines, such as: Influenza vaccine. This is recommended every year. Tetanus, diphtheria, and acellular pertussis (Tdap, Td) vaccine. You may need a Td booster every 10 years. Zoster vaccine. You may need this after age 76. Pneumococcal 13-valent conjugate (PCV13) vaccine. One dose is recommended after age  78. Pneumococcal polysaccharide (PPSV23) vaccine.  One dose is recommended after age 78. Talk to your health care provider about which screenings and vaccines you need and how often you need them. This information is not intended to replace advice given to you by your health care provider. Make sure you discuss any questions you have with your health care provider. Document Released: 09/18/2015 Document Revised: 05/11/2016 Document Reviewed: 06/23/2015 Elsevier Interactive Patient Education  2017 [employer] Prevention in the Home Falls can cause injuries. They can happen to people of all ages. There are many things you can do to make your home safe and to help prevent falls. What can I do on the outside of my home? Regularly fix the edges of walkways and driveways and fix any cracks. Remove anything that might make you trip as you walk through a door, such as a raised step or threshold. Trim any bushes or trees on the path to your home. Use bright outdoor lighting. Clear any walking paths of anything that might make someone trip, such as rocks or tools. Regularly check to see if handrails are loose or broken. Make sure that both sides of any steps have handrails. Any raised decks and porches should have guardrails on the edges. Have any leaves, snow, or ice cleared regularly. Use sand or salt on walking paths during winter. Clean up any spills in your garage right away. This includes oil or grease spills. What can I do in the bathroom? Use night lights. Install grab bars by the toilet and in the tub and shower. Do not use towel bars as grab bars. Use non-skid mats or decals in the tub or shower. If you need to sit down in the shower, use a plastic, non-slip stool. Keep the floor dry. Clean up any water that spills on the floor as soon as it happens. Remove soap buildup in the tub or shower regularly. Attach bath mats securely with double-sided non-slip rug tape. Do not have throw rugs and other things on the floor that can make  you trip. What can I do in the bedroom? Use night lights. Make sure that you have a light by your bed that is easy to reach. Do not use any sheets or blankets that are too big for your bed. They should not hang down onto the floor. Have a firm chair that has side arms. You can use this for support while you get dressed. Do not have throw rugs and other things on the floor that can make you trip. What can I do in the kitchen? Clean up any spills right away. Avoid walking on wet floors. Keep items that you use a lot in easy-to-reach places. If you need to reach something above you, use a strong step stool that has a grab bar. Keep electrical cords out of the way. Do not use floor polish or wax that makes floors slippery. If you must use wax, use non-skid floor wax. Do not have throw rugs and other things on the floor that can make you trip. What can I do with my stairs? Do not leave any items on the stairs. Make sure that there are handrails on both sides of the stairs and use them. Fix handrails that are broken or loose. Make sure that handrails are as long as the stairways. Check any carpeting to make sure that it is firmly attached to the stairs. Fix any carpet that is loose or worn. Avoid having throw rugs at  the top or bottom of the stairs. If you do have throw rugs, attach them to the floor with carpet tape. Make sure that you have a light switch at the top of the stairs and the bottom of the stairs. If you do not have them, ask someone to add them for you. What else can I do to help prevent falls? Wear shoes that: Do not have high heels. Have rubber bottoms. Are comfortable and fit you well. Are closed at the toe. Do not wear sandals. If you use a stepladder: Make sure that it is fully opened. Do not climb a closed stepladder. Make sure that both sides of the stepladder are locked into place. Ask someone to hold it for you, if possible. Clearly mark and make sure that you can  see: Any grab bars or handrails. First and last steps. Where the edge of each step is. Use tools that help you move around (mobility aids) if they are needed. These include: Canes. Walkers. Scooters. Crutches. Turn on the lights when you go into a dark area. Replace any light bulbs as soon as they burn out. Set up your furniture so you have a clear path. Avoid moving your furniture around. If any of your floors are uneven, fix them. If there are any pets around you, be aware of where they are. Review your medicines with your doctor. Some medicines can make you feel dizzy. This can increase your chance of falling. Ask your doctor what other things that you can do to help prevent falls. This information is not intended to replace advice given to you by your health care provider. Make sure you discuss any questions you have with your health care provider. Document Released: 06/18/2009 Document Revised: 01/28/2016 Document Reviewed: 09/26/2014 Elsevier Interactive Patient Education  2017 [employer].

## 2022-06-29 DIAGNOSIS — M109 Gout, unspecified: Secondary | ICD-10-CM | POA: Diagnosis not present

## 2022-06-29 DIAGNOSIS — I10 Essential (primary) hypertension: Secondary | ICD-10-CM | POA: Diagnosis not present

## 2022-06-29 DIAGNOSIS — G8929 Other chronic pain: Secondary | ICD-10-CM | POA: Diagnosis not present

## 2022-06-29 DIAGNOSIS — J45909 Unspecified asthma, uncomplicated: Secondary | ICD-10-CM | POA: Diagnosis not present

## 2022-06-29 DIAGNOSIS — N3946 Mixed incontinence: Secondary | ICD-10-CM | POA: Diagnosis not present

## 2022-06-29 DIAGNOSIS — S32010D Wedge compression fracture of first lumbar vertebra, subsequent encounter for fracture with routine healing: Secondary | ICD-10-CM | POA: Diagnosis not present

## 2022-07-06 DIAGNOSIS — I10 Essential (primary) hypertension: Secondary | ICD-10-CM | POA: Diagnosis not present

## 2022-07-06 DIAGNOSIS — S32010D Wedge compression fracture of first lumbar vertebra, subsequent encounter for fracture with routine healing: Secondary | ICD-10-CM | POA: Diagnosis not present

## 2022-07-06 DIAGNOSIS — M109 Gout, unspecified: Secondary | ICD-10-CM | POA: Diagnosis not present

## 2022-07-06 DIAGNOSIS — J45909 Unspecified asthma, uncomplicated: Secondary | ICD-10-CM | POA: Diagnosis not present

## 2022-07-06 DIAGNOSIS — G8929 Other chronic pain: Secondary | ICD-10-CM | POA: Diagnosis not present

## 2022-07-06 DIAGNOSIS — N3946 Mixed incontinence: Secondary | ICD-10-CM | POA: Diagnosis not present

## 2022-07-11 DIAGNOSIS — R911 Solitary pulmonary nodule: Secondary | ICD-10-CM | POA: Diagnosis not present

## 2022-07-11 DIAGNOSIS — C7931 Secondary malignant neoplasm of brain: Secondary | ICD-10-CM | POA: Diagnosis not present

## 2022-07-11 DIAGNOSIS — C439 Malignant melanoma of skin, unspecified: Secondary | ICD-10-CM | POA: Diagnosis not present

## 2022-07-22 ENCOUNTER — Other Ambulatory Visit: Payer: Self-pay | Admitting: Family Medicine

## 2022-07-25 MED ORDER — MAGNESIUM OXIDE 400 MG PO TABS: 1.0000 | ORAL_TABLET | Freq: Two times a day (BID) | ORAL | 1 refills | Status: DC

## 2022-07-25 NOTE — Telephone Encounter (Signed)
Last filled on 03/21/22 #180 tabs with 0 refilled. F/u was on 03/15/22

## 2022-08-04 DIAGNOSIS — C7931 Secondary malignant neoplasm of brain: Secondary | ICD-10-CM | POA: Diagnosis not present

## 2022-08-04 DIAGNOSIS — G893 Neoplasm related pain (acute) (chronic): Secondary | ICD-10-CM | POA: Diagnosis not present

## 2022-08-04 DIAGNOSIS — Z9289 Personal history of other medical treatment: Secondary | ICD-10-CM | POA: Insufficient documentation

## 2022-08-04 DIAGNOSIS — C439 Malignant melanoma of skin, unspecified: Secondary | ICD-10-CM | POA: Diagnosis not present

## 2022-08-30 ENCOUNTER — Encounter: Payer: Self-pay | Admitting: Family Medicine

## 2022-08-30 NOTE — Telephone Encounter (Signed)
Aware, will watch for correspondence  

## 2022-08-30 NOTE — Telephone Encounter (Signed)
I spoke with Hilliard Clark (DPR signed) starting 3-4 days ago pt started with symptoms of prod cough (cough is better now and pt coughing a lot less; Hilliard Clark is not sure color of phlegm.pt has been taking mucinex. No fever; pt started with wheezing on 08/29/22 or 08/30/22. No CP or SOB. Hilliard Clark said pt is more concerned with these symptoms due to [place]. Hilliard Clark said he is going out of town now and no available appts at [facility] or [facility] today or tomorrow. Hilliard Clark thinks pt might be doing a little better today. Sean scheduled appt [facility] 08/31/22 at 10:30.with UC & ED precautions which Hilliard Clark understands. Sending note to DR [employer] and [employer].

## 2022-08-30 NOTE — Telephone Encounter (Signed)
Please triage

## 2022-08-31 ENCOUNTER — Ambulatory Visit
Admission: RE | Admit: 2022-08-31 | Discharge: 2022-08-31 | Disposition: A | Discharge status: Home / Self Care | Payer: Medicare Other | Source: Ambulatory Visit | Attending: Urgent Care | Admitting: Urgent Care

## 2022-08-31 VITALS — BP 136/75 | HR 76 | Temp 99.0°F | Resp 18

## 2022-08-31 DIAGNOSIS — J22 Unspecified acute lower respiratory infection: Secondary | ICD-10-CM | POA: Diagnosis not present

## 2022-08-31 DIAGNOSIS — J069 Acute upper respiratory infection, unspecified: Secondary | ICD-10-CM | POA: Diagnosis not present

## 2022-08-31 MED ORDER — BENZONATATE 100 MG PO CAPS: ORAL_CAPSULE | ORAL | 0 refills | Status: DC

## 2022-08-31 MED ORDER — AZITHROMYCIN 250 MG PO TABS: ORAL_TABLET | ORAL | 0 refills | Status: DC

## 2022-08-31 NOTE — Discharge Instructions (Addendum)
You have been diagnosed with a viral upper respiratory infection based on your symptoms and exam. Viral illnesses cannot be treated with antibiotics - they are self limiting - and you should find your symptoms resolving within a few days. Get plenty of rest and non-caffeinated fluids. Watch for signs of dehydration including reduced urine output and dark colored urine.  Continued use of Mucinex along with lots of fluids to help thin and loosen mucus secretions from your respiratory pathways.  I have prescribed benzonatate, a cough medicine, to help suppress your cough.    Saline mist spray is helpful for removing excess mucus from your nose.  Room humidifiers are helpful to ease breathing at night.   Follow up here or with your primary care provider if your symptoms are worsening or not improving.

## 2022-08-31 NOTE — ED Triage Notes (Signed)
Pt. Presents to UC w/ c/o a cough, chest congestion, nasal congestion, and a sore throat for the past 4 days.

## 2022-08-31 NOTE — ED Provider Notes (Signed)
Roderic Palau    CSN: [id number] Arrival date & time: 08/31/22  1030      History   Chief Complaint Chief Complaint  Patient presents with   Cough    Entered by patient    HPI Andrea Peters is a 76 y.o. female.    Cough   Presents to urgent care with complaint of cough, chest congestion, nasal congestion, sore throat x 4 days.  She is accompanied by a family member.  Patient with recent history of melanoma treatment in multiple organs including her lungs.  Her family member states that there continues to be a lesion in her lungs.  Patient is concerned about risk of developing bronchitis versus pneumonia.  Past Medical History:  Diagnosis Date   Allergic rhinitis    CHOLELITHIASIS, HX OF 11/17/2010   Qualifier: Diagnosis of  By: Glori Bickers MD, Carmell [place]    Dysplastic nevus 02/06/2013   R flank - moderate   Hyperglycemia    Hyperlipidemia    Hypertension    Melanoma ([place]) 08/17/2012   back of right arm   Mixed incontinence    Obesity    Plantar fasciitis    with significant disability   Tendonitis of foot    L, chronic foot pain    Patient Active Problem List   Diagnosis Date Noted   Acute cystitis without hematuria 05/05/2022   Burning with urination 05/05/2022   Skin tear of lower leg without complication, right, initial encounter 03/15/2022   Anxiety 03/15/2022   L1 vertebral fracture ([place]) 03/15/2022   Fall at home 03/15/2022   Cancer associated pain 03/15/2022   Hypomagnesemia 03/09/2022   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   Liver lesion 09/29/2021   Heart burn 09/24/2021   Epigastric pain 09/24/2021   Flank pain 09/24/2021   Decreased appetite 09/24/2021   Generalized abdominal pain 09/24/2021   Subclinical hypothyroidism 03/02/2021   Serum calcium elevated 11/05/2019   Prediabetes 10/26/2018   Hordeolum externum of right lower eyelid 10/04/2018   Fatigue 03/29/2016   Hypokalemia 10/19/2015   Osteopenia 10/19/2015   Estrogen deficiency  10/15/2014   Melanoma of upper arm ([place]) 05/08/2013   Gout 11/17/2010   HYPERCHOLESTEROLEMIA, PURE 05/17/2007   ALLERGIC  RHINITIS 01/23/2007   Essential hypertension 12/20/2006   FASCIITIS, PLANTAR 12/20/2006   URINARY INCONTINENCE, STRESS 12/20/2006    Past Surgical History:  Procedure Laterality Date   ABDOMINAL HYSTERECTOMY     fibroids, ovaries intact   BREAST EXCISIONAL BIOPSY Bilateral 1960's -1970's   benign   EYE SURGERY Bilateral 12/2015   at [place]  5/07   Tendon rupture L foot    OB History   No obstetric history on file.      Home Medications    Prior to Admission medications   Medication Sig Start Date End Date Taking? Authorizing Provider  acetaminophen (TYLENOL) 325 MG tablet Take by mouth. 02/25/22   [provider]  allopurinol (ZYLOPRIM) 300 MG tablet TAKE 1 TABLET BY MOUTH EVERY DAY 02/07/22   Tower, Wynelle Fanny, MD  amLODipine (NORVASC) 10 MG tablet Take 1 tablet (10 mg total) by mouth daily. 03/21/22   Tower, Wynelle Fanny, MD  apixaban (ELIQUIS) 5 MG TABS tablet Take 1 tablet (5 mg total) by mouth 2 (two) times daily. 04/18/22   Tower, Wynelle Fanny, MD  fluticasone furoate-vilanterol (BREO ELLIPTA) 200-25 MCG/ACT AEPB USE 1 INHALATION ORALLY    DAILY 10/18/21   Tower, Wynelle Fanny, MD  magnesium oxide (  MAG-OX) 400 MG tablet Take 1 tablet (400 mg total) by mouth in the morning and at bedtime. 07/25/22   Tower, Wynelle Fanny, MD  Multiple Vitamin (MULTIVITAMIN) tablet Take 2 tablets by mouth daily.     [provider]  ondansetron (ZOFRAN-ODT) 4 MG disintegrating tablet Take by mouth.    [provider]  senna-docusate (SENOKOT-S) 8.6-50 MG tablet Take by mouth. 02/25/22   [provider]  traMADol (ULTRAM) 50 MG tablet Take 1 tablet (50 mg total) by mouth every 12 (twelve) hours as needed. 05/24/22   Tower, Wynelle Fanny, MD    Family History Family History  Problem Relation Age of Onset   Heart attack Mother    Other Mother         ?thyroid problems   Lung cancer Father     Social History Social History   Tobacco Use   Smoking status: Never   Smokeless tobacco: Never  Vaping Use   Vaping Use: Never used  Substance Use Topics   Alcohol use: No    Alcohol/week: 0.0 standard drinks of alcohol   Drug use: No     Allergies   Alendronate, Lipitor [atorvastatin], Naproxen sodium, Bioflavonoids, and Naproxen   Review of Systems Review of Systems  Respiratory:  Positive for cough.      Physical Exam Triage Vital Signs ED Triage Vitals  Enc Vitals Group     BP      Pulse      Resp      Temp      Temp src      SpO2      Weight      Height      Head Circumference      Peak Flow      Pain Score      Pain Loc      Pain Edu?      Excl. in [place]?    No data found.  Updated Vital Signs There were no vitals taken for this visit.  Visual Acuity Right Eye Distance:   Left Eye Distance:   Bilateral Distance:    Right Eye Near:   Left Eye Near:    Bilateral Near:     Physical Exam Vitals reviewed.  Constitutional:      Appearance: Normal appearance. She is ill-appearing.  Cardiovascular:     Rate and Rhythm: Normal rate and regular rhythm.     Pulses: Normal pulses.     Heart sounds: Normal heart sounds.  Pulmonary:     Effort: Pulmonary effort is normal.     Breath sounds: Normal breath sounds. No wheezing or rhonchi.  Skin:    General: Skin is warm and dry.  Neurological:     General: No focal deficit present.     Mental Status: She is alert and oriented to person, place, and time.  Psychiatric:        Mood and Affect: Mood normal.        Behavior: Behavior normal.      UC Treatments / Results  Labs (all labs ordered are listed, but only abnormal results are displayed) Labs Reviewed - No data to display  EKG   Radiology No results found.  Procedures Procedures (including critical care time)  Medications Ordered in UC Medications - No data to display  Initial  Impression / Assessment and Plan / UC Course  I have reviewed the triage vital signs and the nursing notes.  Pertinent labs & imaging results  that were available during my care of the patient were reviewed by me and considered in my medical decision making (see chart for details).   Patient is afebrile here without recent antipyretics. Satting well on room air. Overall is ill appearing, though well hydrated, and without respiratory distress. Pulmonary exam is unremarkable.  Lungs CTAB without wheezing, rhonchi, rales.  Dry sounding, frequent cough is present and triggered by deep breathing.  Suspect viral process given her symptoms of less than 5 days.  We discussed the fact that antibiotics would not treat a viral condition.  However given her current respiratory status with melanoma lesions still present in her lungs, will prescribe antibiotic to cover for risk of secondary bacterial infection and take advantage of azithromycin anti-inflammatory properties.  Also will prescribe benzonatate to help calm her cough.  Final Clinical Impressions(s) / UC Diagnoses   Final diagnoses:  None   Discharge Instructions   None    ED Prescriptions   None    PDMP not reviewed this encounter.   Rose Phi, [place] 08/31/22 1052

## 2022-09-07 ENCOUNTER — Other Ambulatory Visit: Payer: Self-pay | Admitting: Family Medicine

## 2022-09-08 ENCOUNTER — Telehealth: Payer: Self-pay | Admitting: *Deleted

## 2022-09-08 ENCOUNTER — Encounter: Payer: Self-pay | Admitting: Family Medicine

## 2022-09-08 DIAGNOSIS — J069 Acute upper respiratory infection, unspecified: Secondary | ICD-10-CM

## 2022-09-08 MED ORDER — BENZONATATE 100 MG PO CAPS: ORAL_CAPSULE | ORAL | 0 refills | Status: DC

## 2022-09-08 NOTE — Telephone Encounter (Signed)
I sent it in  Please follow up if no further improvement

## 2022-09-08 NOTE — Telephone Encounter (Signed)
Son sent a message saying:  Mom is out of the cough medicine she was given by the urgent care. The medicine was working but she has no refills left and her cough is still acting up. Can she have this refilled again?

## 2022-09-09 ENCOUNTER — Other Ambulatory Visit: Payer: Self-pay | Admitting: Family Medicine

## 2022-09-09 DIAGNOSIS — Z1231 Encounter for screening mammogram for malignant neoplasm of breast: Secondary | ICD-10-CM

## 2022-09-09 NOTE — Telephone Encounter (Signed)
Sent mychart letting pt know Rx was sent

## 2022-09-20 ENCOUNTER — Other Ambulatory Visit: Payer: Self-pay | Admitting: Family Medicine

## 2022-09-21 NOTE — Telephone Encounter (Signed)
Please schedule an annual exam this spring

## 2022-09-21 NOTE — Telephone Encounter (Signed)
Refill request Allopurinol Last office visit acute 04/07/22 with NP No upcoming appointment scheduled Last refill 02/07/22 #90

## 2022-09-22 NOTE — Telephone Encounter (Signed)
Lvmtcb. Sent mychart message

## 2022-09-22 NOTE — Telephone Encounter (Signed)
Patient son called in and stated that he will let his mom know and call back with some dates and times.

## 2022-09-26 ENCOUNTER — Ambulatory Visit
Admission: RE | Admit: 2022-09-26 | Discharge: 2022-09-26 | Disposition: A | Discharge status: Home / Self Care | Payer: Medicare Other | Source: Ambulatory Visit | Attending: Family Medicine | Admitting: Family Medicine

## 2022-09-26 DIAGNOSIS — Z1231 Encounter for screening mammogram for malignant neoplasm of breast: Secondary | ICD-10-CM | POA: Insufficient documentation

## 2022-10-11 ENCOUNTER — Telehealth: Payer: Self-pay | Admitting: Family Medicine

## 2022-10-11 DIAGNOSIS — E7800 Pure hypercholesterolemia, unspecified: Secondary | ICD-10-CM

## 2022-10-11 DIAGNOSIS — I10 Essential (primary) hypertension: Secondary | ICD-10-CM

## 2022-10-11 DIAGNOSIS — E038 Other specified hypothyroidism: Secondary | ICD-10-CM

## 2022-10-11 DIAGNOSIS — R7303 Prediabetes: Secondary | ICD-10-CM

## 2022-10-11 DIAGNOSIS — M1000 Idiopathic gout, unspecified site: Secondary | ICD-10-CM

## 2022-10-11 NOTE — Telephone Encounter (Signed)
-----   Message from Velna Hatchet, RT sent at 09/26/2022  2:16 PM EST ----- Regarding: Wed 2/7 lab Patient is scheduled for cpx, please order future labs.  Thanks, Anda Kraft

## 2022-10-12 ENCOUNTER — Other Ambulatory Visit (INDEPENDENT_AMBULATORY_CARE_PROVIDER_SITE_OTHER): Payer: Medicare Other

## 2022-10-12 DIAGNOSIS — R7303 Prediabetes: Secondary | ICD-10-CM

## 2022-10-12 DIAGNOSIS — I10 Essential (primary) hypertension: Secondary | ICD-10-CM

## 2022-10-12 DIAGNOSIS — E038 Other specified hypothyroidism: Secondary | ICD-10-CM

## 2022-10-12 DIAGNOSIS — M1000 Idiopathic gout, unspecified site: Secondary | ICD-10-CM

## 2022-10-12 DIAGNOSIS — E7800 Pure hypercholesterolemia, unspecified: Secondary | ICD-10-CM | POA: Diagnosis not present

## 2022-10-12 LAB — COMPREHENSIVE METABOLIC PANEL
ALT: 16 U/L (ref 0–35)
AST: 18 U/L (ref 0–37)
Albumin: 4.5 g/dL (ref 3.5–5.2)
Alkaline Phosphatase: 103 U/L (ref 39–117)
BUN: 21 mg/dL (ref 6–23)
CO2: 32 mEq/L (ref 19–32)
Calcium: 10.1 mg/dL (ref 8.4–10.5)
Chloride: 102 mEq/L (ref 96–112)
Creatinine, Ser: 0.87 mg/dL (ref 0.40–1.20)
GFR: 63.93 mL/min (ref >60.00)
Glucose, Bld: 131 mg/dL — ABNORMAL HIGH (ref 70–99)
Potassium: 3.9 mEq/L (ref 3.5–5.1)
Sodium: 142 mEq/L (ref 135–145)
Total Bilirubin: 0.5 mg/dL (ref 0.2–1.2)
Total Protein: 6.6 g/dL (ref 6.0–8.3)

## 2022-10-12 LAB — LIPID PANEL
Cholesterol: 197 mg/dL (ref 0–200)
HDL: 50.4 mg/dL (ref >39.00)
LDL Cholesterol: 119 mg/dL — ABNORMAL HIGH (ref 0–99)
NonHDL: 146.91
Total CHOL/HDL Ratio: 4
Triglycerides: 138 mg/dL (ref 0.0–149.0)
VLDL: 27.6 mg/dL (ref 0.0–40.0)

## 2022-10-12 LAB — CBC WITH DIFFERENTIAL/PLATELET
Basophils Absolute: 0.1 10*3/uL (ref 0.0–0.1)
Basophils Relative: 1.2 % (ref 0.0–3.0)
Eosinophils Absolute: 0.3 10*3/uL (ref 0.0–0.7)
Eosinophils Relative: 4.6 % (ref 0.0–5.0)
HCT: 37.7 % (ref 36.0–46.0)
Hemoglobin: 12.3 g/dL (ref 12.0–15.0)
Lymphocytes Relative: 33.3 % (ref 12.0–46.0)
Lymphs Abs: 2.1 10*3/uL (ref 0.7–4.0)
MCHC: 32.5 g/dL (ref 30.0–36.0)
MCV: 91.4 fl (ref 78.0–100.0)
Monocytes Absolute: 0.6 10*3/uL (ref 0.1–1.0)
Monocytes Relative: 9.1 % (ref 3.0–12.0)
Neutro Abs: 3.3 10*3/uL (ref 1.4–7.7)
Neutrophils Relative %: 51.8 % (ref 43.0–77.0)
Platelets: 280 10*3/uL (ref 150.0–400.0)
RBC: 4.12 Mil/uL (ref 3.87–5.11)
RDW: 16.7 % — ABNORMAL HIGH (ref 11.5–15.5)
WBC: 6.3 10*3/uL (ref 4.0–10.5)

## 2022-10-12 LAB — TSH: TSH: 5.69 u[IU]/mL — ABNORMAL HIGH (ref 0.35–5.50)

## 2022-10-12 LAB — MAGNESIUM: Magnesium: 1.8 mg/dL (ref 1.5–2.5)

## 2022-10-12 LAB — HEMOGLOBIN A1C: Hgb A1c MFr Bld: 5.8 % (ref 4.6–6.5)

## 2022-10-12 LAB — URIC ACID: Uric Acid, Serum: 4 mg/dL (ref 2.4–7.0)

## 2022-10-12 LAB — T4, FREE: Free T4: 0.89 ng/dL (ref 0.60–1.60)

## 2022-10-20 ENCOUNTER — Ambulatory Visit (INDEPENDENT_AMBULATORY_CARE_PROVIDER_SITE_OTHER): Payer: Medicare Other | Admitting: Family Medicine

## 2022-10-20 ENCOUNTER — Encounter: Payer: Self-pay | Admitting: Family Medicine

## 2022-10-20 VITALS — BP 131/70 | HR 74 | Temp 97.6°F | Ht 60.5 in | Wt 167.0 lb

## 2022-10-20 DIAGNOSIS — C4360 Malignant melanoma of unspecified upper limb, including shoulder: Secondary | ICD-10-CM

## 2022-10-20 DIAGNOSIS — R011 Cardiac murmur, unspecified: Secondary | ICD-10-CM | POA: Insufficient documentation

## 2022-10-20 DIAGNOSIS — M8589 Other specified disorders of bone density and structure, multiple sites: Secondary | ICD-10-CM

## 2022-10-20 DIAGNOSIS — M7022 Olecranon bursitis, left elbow: Secondary | ICD-10-CM

## 2022-10-20 DIAGNOSIS — R7303 Prediabetes: Secondary | ICD-10-CM | POA: Diagnosis not present

## 2022-10-20 DIAGNOSIS — F419 Anxiety disorder, unspecified: Secondary | ICD-10-CM

## 2022-10-20 DIAGNOSIS — E038 Other specified hypothyroidism: Secondary | ICD-10-CM | POA: Diagnosis not present

## 2022-10-20 DIAGNOSIS — M7032 Other bursitis of elbow, left elbow: Secondary | ICD-10-CM | POA: Insufficient documentation

## 2022-10-20 DIAGNOSIS — I10 Essential (primary) hypertension: Secondary | ICD-10-CM

## 2022-10-20 DIAGNOSIS — M1000 Idiopathic gout, unspecified site: Secondary | ICD-10-CM

## 2022-10-20 DIAGNOSIS — G8929 Other chronic pain: Secondary | ICD-10-CM

## 2022-10-20 DIAGNOSIS — M25562 Pain in left knee: Secondary | ICD-10-CM

## 2022-10-20 DIAGNOSIS — E7800 Pure hypercholesterolemia, unspecified: Secondary | ICD-10-CM

## 2022-10-20 DIAGNOSIS — M25561 Pain in right knee: Secondary | ICD-10-CM | POA: Diagnosis not present

## 2022-10-20 DIAGNOSIS — R5382 Chronic fatigue, unspecified: Secondary | ICD-10-CM | POA: Diagnosis not present

## 2022-10-20 DIAGNOSIS — M25569 Pain in unspecified knee: Secondary | ICD-10-CM | POA: Insufficient documentation

## 2022-10-20 MED ORDER — SIMVASTATIN 20 MG PO TABS: 20.0000 mg | ORAL_TABLET | Freq: Every day | ORAL | 3 refills | Status: DC

## 2022-10-20 NOTE — Assessment & Plan Note (Signed)
Dexa 01/2020 Tried alendronate but did not tolerate it  Has had some falls/this is now improved  One pathologic spinal fx related to cancer Uses walker, discussed fall prevention   Per pt was taken off vit D from oncologist-has not re started I inst her to ask them if ok to start back on it  Past elevated ca level (now nl) so not taking calcium  Enc more exercise as tolerated , she is gradually able to do more

## 2022-10-20 NOTE — Assessment & Plan Note (Signed)
bp is stable and well controlled  bp in fair control at this time  BP Readings from Last 1 Encounters:  10/20/22 131/70   No changes needed Most recent labs reviewed  Disc lifstyle change with low sodium diet and exercise   Amlodipine 10 mg daily

## 2022-10-20 NOTE — Assessment & Plan Note (Signed)
Doing better  PHQ nl as well

## 2022-10-20 NOTE — Assessment & Plan Note (Signed)
Pt has swollen area on tip of L elbow  Tender if she traumatizes it otherwise not symptomatic  No s/s of infection  Has been improving on it's own  Inst to continue to monitor - if this worsens or fails to improve may need further eval/ poss aspiration

## 2022-10-20 NOTE — Assessment & Plan Note (Signed)
Lab Results  Component Value Date   LABURIC 4.0 10/12/2022   Continues allopurinol  No episodes

## 2022-10-20 NOTE — Assessment & Plan Note (Signed)
Lab Results  Component Value Date   HGBA1C 5.8 10/12/2022   This is improved, commended disc imp of low glycemic diet and wt loss to prevent DM2

## 2022-10-20 NOTE — Assessment & Plan Note (Signed)
Continues onc f/u for this with mets  No treatment recently  Has extensive imaging every 3 months Overall doing well

## 2022-10-20 NOTE — Assessment & Plan Note (Addendum)
Suspect OA  She wants to finish her next melanoma f/u at [facility] before pursuing any further eval or tx  May be candidate for injection  No nsaids in light of eliquis  May try voltaren gel otc May sched appt with Dr Lorelei Pont at a later time

## 2022-10-20 NOTE — Patient Instructions (Addendum)
Try the volaren gel for your arthritis   Check on price of tetanus shot at pharmacy if you haven't had one   When you see oncology next- ask them what they recommend for colon cancer screening  Ifob kit and cologuard kit are 2 options Let me know and I can can order   Also ask if you can start back on vitamin D You need it for your bones   I recommend 2000 iu daily of vitamin D3 over the counter   Let us know when you want me to order your bone density test    When you are ready to address the knees - make an appt here with Dr Lorelei Pont (sports medicine)   Your cholesterol is up Get back on simvastatin 20 mg daily  Avoid red meat/ fried foods/ egg yolks/ fatty breakfast meats/ butter, cheese and high fat dairy/ and shellfish

## 2022-10-20 NOTE — Assessment & Plan Note (Signed)
Level today nl at 1.8  Taking mag ox 400 mg daily

## 2022-10-20 NOTE — Assessment & Plan Note (Signed)
Lab Results  Component Value Date   CALCIUM 10.1 10/12/2022   PHOS 3.0 11/05/2019   This is resolved now  Not taking ca supplement

## 2022-10-20 NOTE — Assessment & Plan Note (Signed)
More after resp virus Watching this in light of sub clinical hypothyroidism   Labs are reassuring otherwise

## 2022-10-20 NOTE — Assessment & Plan Note (Signed)
TSH is very mildly elevated but FT4 is still normal Pt has had some fatigue (but this could be post viral from illness as well) Will continue to follow  May need tx in the future   Want to avoid un necessary thyroid replacement in light of osteopenia

## 2022-10-20 NOTE — Assessment & Plan Note (Signed)
Disc goals for lipids and reasons to control them Rev last labs with pt Rev low sat fat diet in detail LDL is up to 119 Off simvastatin (she was unsure why) - may have been during cancer tx   Will re start this 20 mg daily  Alert if side effects  Enc healthy diet

## 2022-10-20 NOTE — Progress Notes (Signed)
Subjective:    Patient ID: Andrea Peters, female    DOB: 1945-10-12, 77 y.o.   MRN: [id number]  HPI Pt presents for annual f/u of chronic medical problems   Wt Readings from Last 3 Encounters:  10/20/22 167 lb (75.8 kg)  06/28/22 150 lb (68 kg)  05/05/22 155 lb 6 oz (70.5 kg)   32.08 kg/m  Vitals:   10/20/22 1034 10/20/22 1109  BP: (!) 146/68 131/70  Pulse: 74   Temp: 97.6 F (36.4 C)   SpO2: 97%     In general feels pretty good  Her arthritis is pretty bad  She cannot use nsaid sue to eliquis  Got some voltaren gel and will try it soon   Had some pain in L elbow - swelling  Improved but sensitive   Wants to start driving again- is practicing a bit  Hopes this will improve soon/ has issues with hands grabbing the steering wheel    Immunization History  Administered Date(s) Administered   Fluad Quad(high Dose 65+) 05/12/2020   Influenza,inj,Quad PF,6+ Mos 10/15/2014, 10/19/2015, 08/09/2016, 08/17/2017, 10/04/2018   PFIZER(Purple Top)SARS-COV-2 Vaccination 11/03/2019, 11/26/2019   Pneumococcal Conjugate-13 10/15/2014   Pneumococcal Polysaccharide-23 03/22/2010   Td 01/13/1998, 01/30/2008   Zoster, Live 02/05/2008   Health Maintenance Due  Topic Date Due   Zoster Vaccines- Shingrix (1 of 2) Never done   DTaP/Tdap/Td (3 - Tdap) 01/29/2018   COVID-19 Vaccine (3 - Pfizer risk series) 12/24/2019   INFLUENZA VACCINE  04/05/2022    Shingrix: declines for now  May want later when done at [facility]   Tetanus shot - is due   Flu shot - thinks she had at [employer] 1/ 2024 Self breast exam: no lumps  Declines exam today   Colonoscopy 11/2013  with no recall Not sure if she wants screening yet   Dexa  01/2020- wants to wait until she is done with next cancer f/u at [place]  Osteopenia worse in FN Tried alendronate dna did not tolerate it  Falls:  none since the summer , she uses her walker most of the time   Fractures had L1 vert fx-pathologic from  malignancy Supplements ([facility] took her off of everything) - was on D earlier  Exercise : walks every day  Has a physical therapy plan- does exercises for balance   Mood    06/28/2022   10:50 AM 03/02/2021   11:00 AM 05/12/2020   10:36 AM 11/05/2019   11:28 AM 10/23/2018    2:46 PM  Depression screen PHQ 2/9  Decreased Interest 0 0 0 0 0  Down, Depressed, Hopeless 0 0 0 0 0  PHQ - 2 Score 0 0 0 0 0  Altered sleeping    0 0  Tired, decreased energy    1 0  Change in appetite    0 0  Feeling bad or failure about yourself     0 0  Trouble concentrating    0 0  Moving slowly or fidgety/restless    0 0  Suicidal thoughts    0 0  PHQ-9 Score    1 0  Difficult doing work/chores    Not difficult at all Not difficult at all     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 Dry  Gets tired easily , has to lie down and rest       Lab Results  Component Value Date   CALCIUM 10.1 10/12/2022   PHOS 3.0 11/05/2019   Takes mag for low mag Mag level is nl at 1.8    Prediabetes Lab Results  Component Value Date   HGBA1C 5.8 10/12/2022     H/o gout  Takes allopurinol  Lab Results  Component Value Date   LABURIC 4.0 10/12/2022   Hyperlipidemia Lab Results  Component Value Date   CHOL 197 10/12/2022   CHOL 152 02/23/2021   CHOL 143 10/29/2019   Lab Results  Component Value Date   HDL 50.40 10/12/2022   HDL 56.50  02/23/2021   HDL 45.70 10/29/2019   Lab Results  Component Value Date   LDLCALC 119 (H) 10/12/2022   LDLCALC 84 02/23/2021   LDLCALC 75 10/29/2019   Lab Results  Component Value Date   TRIG 138.0 10/12/2022   TRIG 58.0 02/23/2021   TRIG 112.0 10/29/2019   Lab Results  Component Value Date   CHOLHDL 4 10/12/2022   CHOLHDL 3 02/23/2021   CHOLHDL 3 10/29/2019   Lab Results  Component Value Date   LDLDIRECT 141.9 01/23/2007   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    Patient Active Problem List   Diagnosis Date Noted   Knee pain 10/20/2022   Bursitis of left elbow 10/20/2022   L1 vertebral fracture ([place]) 03/15/2022   Cancer associated pain 03/15/2022   Hypomagnesemia 03/09/2022   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   Subclinical hypothyroidism 03/02/2021   Serum calcium elevated 11/05/2019   Prediabetes 10/26/2018   Fatigue 03/29/2016   Osteopenia 10/19/2015   Estrogen deficiency 10/15/2014   Melanoma of upper arm ([place]) 05/08/2013   Gout 11/17/2010   HYPERCHOLESTEROLEMIA, PURE 05/17/2007   ALLERGIC  RHINITIS 01/23/2007   Essential hypertension 12/20/2006   Past Medical History:  Diagnosis Date   Allergic rhinitis    CHOLELITHIASIS, HX OF 11/17/2010   Qualifier: Diagnosis of  By: Glori Bickers MD, Carmell Austria    Dysplastic nevus 02/06/2013   R flank - moderate   Hyperglycemia    Hyperlipidemia    Hypertension    Melanoma ([place]) 08/17/2012   back of right arm   Mixed incontinence    Obesity    Plantar fasciitis    with significant disability   Tendonitis of foot    L, chronic foot pain   Past Surgical History:  Procedure Laterality Date   ABDOMINAL HYSTERECTOMY     fibroids, ovaries intact   BREAST EXCISIONAL BIOPSY Bilateral 1960's -1970's   benign   EYE SURGERY Bilateral 12/2015   at [place]  5/07   Tendon rupture L foot   Social History   Tobacco Use   Smoking status: Never   Smokeless tobacco: Never  Vaping Use   Vaping Use: Never used  Substance Use  Topics   Alcohol use: No    Alcohol/week: 0.0 standard  drinks of alcohol   Drug use: No   Family History  Problem Relation Age of Onset   Heart attack Mother    Other Mother        ?thyroid problems   Lung cancer Father    Allergies  Allergen Reactions   Alendronate     Hair loss  Nausea Diarrhea    Lipitor [Atorvastatin]     Muscle pain   Naproxen Sodium    Bioflavonoids Rash   Naproxen Other (See Comments) and Rash    Severe indigestion   Current Outpatient Medications on File Prior to Visit  Medication Sig Dispense Refill   acetaminophen (TYLENOL) 325 MG tablet Take by mouth.     allopurinol (ZYLOPRIM) 300 MG tablet TAKE 1 TABLET BY MOUTH EVERY DAY 90 tablet 1   amLODipine (NORVASC) 10 MG tablet Take 1 tablet (10 mg total) by mouth daily. 90 tablet 3   apixaban (ELIQUIS) 5 MG TABS tablet Take 1 tablet (5 mg total) by mouth 2 (two) times daily. 180 tablet 1   fluticasone (FLONASE) 50 MCG/ACT nasal spray SPRAY 2 SPRAYS INTO EACH NOSTRIL EVERY DAY 48 mL 1   fluticasone furoate-vilanterol (BREO ELLIPTA) 200-25 MCG/ACT AEPB USE 1 INHALATION ORALLY    DAILY 90 each 1   magnesium oxide (MAG-OX) 400 MG tablet Take 1 tablet (400 mg total) by mouth in the morning and at bedtime. (Patient taking differently: Take 1 tablet by mouth daily.) 180 tablet 1   Multiple Vitamin (MULTIVITAMIN) tablet Take 2 tablets by mouth daily.      No current facility-administered medications on file prior to visit.    Review of Systems  Constitutional:  Positive for fatigue. Negative for activity change, appetite change, fever and unexpected weight change.  HENT:  Negative for congestion, ear pain, rhinorrhea, sinus pressure and sore throat.   Eyes:  Negative for pain, redness and visual disturbance.  Respiratory:  Negative for cough, shortness of breath and wheezing.   Cardiovascular:  Negative for chest pain and palpitations.  Gastrointestinal:  Negative for abdominal pain, blood in stool,  constipation and diarrhea.  Endocrine: Negative for polydipsia and polyuria.  Genitourinary:  Negative for dysuria, frequency and urgency.  Musculoskeletal:  Positive for arthralgias and back pain. Negative for myalgias and neck pain.       L elbow was swollen, is improved now   Skin:  Negative for pallor and rash.  Allergic/Immunologic: Negative for environmental allergies.  Neurological:  Negative for dizziness, syncope and headaches.  Hematological:  Negative for adenopathy. Does not bruise/bleed easily.  Psychiatric/Behavioral:  Negative for decreased concentration and dysphoric mood. The patient is not nervous/anxious.        Objective:   Physical Exam Constitutional:      General: She is not in acute distress.    Appearance: Normal appearance. She is well-developed. She is obese. She is not ill-appearing or diaphoretic.  HENT:     Head: Normocephalic and atraumatic.     Right Ear: Tympanic membrane, ear canal and external ear normal.     Left Ear: Tympanic membrane, ear canal and external ear normal.     Nose: Nose normal. No congestion.     Mouth/Throat:     Mouth: Mucous membranes are moist.     Pharynx: Oropharynx is clear. No posterior oropharyngeal erythema.  Eyes:     General: No scleral icterus.    Extraocular Movements: Extraocular movements intact.     Conjunctiva/sclera: Conjunctivae normal.  Pupils: Pupils are equal, round, and reactive to light.  Neck:     Thyroid: No thyromegaly.     Vascular: No carotid bruit or JVD.  Cardiovascular:     Rate and Rhythm: Normal rate and regular rhythm.     Pulses: Normal pulses.     Heart sounds: Murmur heard.     No gallop.     Comments: Soft systolic M heart at LSB Pulmonary:     Effort: Pulmonary effort is normal. No respiratory distress.     Breath sounds: Normal breath sounds. No wheezing.     Comments: Good air exch Chest:     Chest wall: No tenderness.  Abdominal:     General: Bowel sounds are normal. There  is no distension or abdominal bruit.     Palpations: Abdomen is soft. There is no mass.     Tenderness: There is no abdominal tenderness.     Hernia: No hernia is present.  Genitourinary:    Comments: Breast exam: No mass, nodules, thickening, tenderness, bulging, retraction, inflamation, nipple discharge or skin changes noted.  No axillary or clavicular LA.     Musculoskeletal:        General: No tenderness. Normal range of motion.     Cervical back: Normal range of motion and neck supple. No rigidity. No muscular tenderness.     Right lower leg: No edema.     Left lower leg: No edema.     Comments: Mild/borderline kyphosis   L elbow- mild swelling over olecranon process , rubbery, non tender No redness or warmth   Crepitus-knees   Lymphadenopathy:     Cervical: No cervical adenopathy.  Skin:    General: Skin is warm and dry.     Coloration: Skin is not pale.     Findings: No erythema or rash.     Comments: Fair complexion   Some scattered lentigines   Neurological:     Mental Status: She is alert. Mental status is at baseline.     Cranial Nerves: No cranial nerve deficit.     Motor: No abnormal muscle tone.     Coordination: Coordination normal.     Gait: Gait normal.     Deep Tendon Reflexes: Reflexes are normal and symmetric. Reflexes normal.  Psychiatric:        Mood and Affect: Mood normal.        Cognition and Memory: Cognition and memory normal.           Assessment & Plan:   Problem List Items Addressed This Visit       Cardiovascular and Mediastinum   Essential hypertension - Primary    bp is stable and well controlled  bp in fair control at this time  BP Readings from Last 1 Encounters:  10/20/22 131/70  No changes needed Most recent labs reviewed  Disc lifstyle change with low sodium diet and exercise   Amlodipine 10 mg daily        Relevant Medications   simvastatin (ZOCOR) 20 MG tablet     Endocrine   Subclinical hypothyroidism    TSH  is very mildly elevated but FT4 is still normal Pt has had some fatigue (but this could be post viral from illness as well) Will continue to follow  May need tx in the future   Want to avoid un necessary thyroid replacement in light of osteopenia         Musculoskeletal and Integument   Bursitis of left  elbow    Pt has swollen area on tip of L elbow  Tender if she traumatizes it otherwise not symptomatic  No s/s of infection  Has been improving on it's own  Inst to continue to monitor - if this worsens or fails to improve may need further eval/ poss aspiration        Osteopenia    Dexa 01/2020 Tried alendronate but did not tolerate it  Has had some falls/this is now improved  One pathologic spinal fx related to cancer Uses walker, discussed fall prevention   Per pt was taken off vit D from oncologist-has not re started I inst her to ask them if ok to start back on it  Past elevated ca level (now nl) so not taking calcium  Enc more exercise as tolerated , she is gradually able to do more          Other   RESOLVED: Anxiety    Doing better  PHQ nl as well        Fatigue    More after resp virus Watching this in light of sub clinical hypothyroidism   Labs are reassuring otherwise       Gout    Lab Results  Component Value Date   LABURIC 4.0 10/12/2022  Continues allopurinol  No episodes       HYPERCHOLESTEROLEMIA, PURE    Disc goals for lipids and reasons to control them Rev last labs with pt Rev low sat fat diet in detail LDL is up to 119 Off simvastatin (she was unsure why) - may have been during cancer tx   Will re start this 20 mg daily  Alert if side effects  Enc healthy diet       Relevant Medications   simvastatin (ZOCOR) 20 MG tablet   Hypomagnesemia    Level today nl at 1.8  Taking mag ox 400 mg daily       Knee pain    Suspect OA  She wants to finish her next melanoma f/u at [facility] before pursuing any further eval or tx  May be  candidate for injection  No nsaids in light of eliquis  May try voltaren gel otc May sched appt with Dr Lorelei Pont at a later time       Melanoma of upper arm ([place])    Continues onc f/u for this with mets  No treatment recently  Has extensive imaging every 3 months Overall doing well      Prediabetes    Lab Results  Component Value Date   HGBA1C 5.8 10/12/2022  This is improved, commended disc imp of low glycemic diet and wt loss to prevent DM2       Serum calcium elevated    Lab Results  Component Value Date   CALCIUM 10.1 10/12/2022   PHOS 3.0 11/05/2019  This is resolved now  Not taking ca supplement

## 2022-11-02 ENCOUNTER — Other Ambulatory Visit: Payer: Self-pay | Admitting: Family Medicine

## 2022-11-02 DIAGNOSIS — I2699 Other pulmonary embolism without acute cor pulmonale: Secondary | ICD-10-CM

## 2022-11-02 MED ORDER — APIXABAN 5 MG PO TABS: 5.0000 mg | ORAL_TABLET | Freq: Two times a day (BID) | ORAL | 1 refills | Status: DC

## 2022-11-02 NOTE — Telephone Encounter (Signed)
CPE 10/20/22 and has next years scheduled, last filled on 04/18/22 #180 with 1 refill

## 2022-11-02 NOTE — Telephone Encounter (Signed)
Prescription Request  11/02/2022  Is this a "Controlled Substance" medicine? No  LOV: 10/20/2022  What is the name of the medication or equipment? apixaban (ELIQUIS) 5 MG TABS tablet   Have you contacted your pharmacy to request a refill? Yes pt states she was told by pharmacy that they've sent over a refill request   Which pharmacy would you like this sent to?  [facility] [place] [facility] [address] [place] [address] Phone: [phone] Fax: [phone]     Patient notified that their request is being sent to the clinical staff for review and that they should receive a response within 2 business days.   Please advise at Mobile [phone] (mobile)

## 2022-11-02 NOTE — Telephone Encounter (Signed)
June would be 1 year of anticoagulation I think  If that is the case her oncologist may tell her to stop this

## 2022-11-03 DIAGNOSIS — Z85841 Personal history of malignant neoplasm of brain: Secondary | ICD-10-CM | POA: Diagnosis not present

## 2022-11-03 DIAGNOSIS — G893 Neoplasm related pain (acute) (chronic): Secondary | ICD-10-CM | POA: Diagnosis not present

## 2022-11-03 DIAGNOSIS — Z9289 Personal history of other medical treatment: Secondary | ICD-10-CM | POA: Diagnosis not present

## 2022-11-03 DIAGNOSIS — K862 Cyst of pancreas: Secondary | ICD-10-CM | POA: Diagnosis not present

## 2022-11-03 DIAGNOSIS — C7931 Secondary malignant neoplasm of brain: Secondary | ICD-10-CM | POA: Diagnosis not present

## 2022-11-03 DIAGNOSIS — Z85820 Personal history of malignant melanoma of skin: Secondary | ICD-10-CM | POA: Diagnosis not present

## 2022-11-03 DIAGNOSIS — C439 Malignant melanoma of skin, unspecified: Secondary | ICD-10-CM | POA: Diagnosis not present

## 2022-11-03 DIAGNOSIS — J984 Other disorders of lung: Secondary | ICD-10-CM | POA: Diagnosis not present

## 2022-11-03 DIAGNOSIS — Z08 Encounter for follow-up examination after completed treatment for malignant neoplasm: Secondary | ICD-10-CM | POA: Diagnosis not present

## 2022-11-07 DIAGNOSIS — Z9289 Personal history of other medical treatment: Secondary | ICD-10-CM | POA: Diagnosis not present

## 2022-11-07 DIAGNOSIS — C7931 Secondary malignant neoplasm of brain: Secondary | ICD-10-CM | POA: Diagnosis not present

## 2022-11-07 DIAGNOSIS — G893 Neoplasm related pain (acute) (chronic): Secondary | ICD-10-CM | POA: Diagnosis not present

## 2022-11-07 DIAGNOSIS — C439 Malignant melanoma of skin, unspecified: Secondary | ICD-10-CM | POA: Diagnosis not present

## 2022-11-23 DIAGNOSIS — H43811 Vitreous degeneration, right eye: Secondary | ICD-10-CM | POA: Diagnosis not present

## 2022-11-23 DIAGNOSIS — H524 Presbyopia: Secondary | ICD-10-CM | POA: Diagnosis not present

## 2022-11-23 DIAGNOSIS — H2513 Age-related nuclear cataract, bilateral: Secondary | ICD-10-CM | POA: Diagnosis not present

## 2022-11-23 DIAGNOSIS — H3561 Retinal hemorrhage, right eye: Secondary | ICD-10-CM | POA: Diagnosis not present

## 2023-01-12 DIAGNOSIS — D3142 Benign neoplasm of left ciliary body: Secondary | ICD-10-CM | POA: Diagnosis not present

## 2023-01-12 DIAGNOSIS — C7931 Secondary malignant neoplasm of brain: Secondary | ICD-10-CM | POA: Diagnosis not present

## 2023-01-12 DIAGNOSIS — H3589 Other specified retinal disorders: Secondary | ICD-10-CM | POA: Diagnosis not present

## 2023-01-12 DIAGNOSIS — H35363 Drusen (degenerative) of macula, bilateral: Secondary | ICD-10-CM | POA: Diagnosis not present

## 2023-01-12 DIAGNOSIS — H25013 Cortical age-related cataract, bilateral: Secondary | ICD-10-CM | POA: Diagnosis not present

## 2023-01-15 ENCOUNTER — Other Ambulatory Visit: Payer: Self-pay | Admitting: Family Medicine

## 2023-01-26 ENCOUNTER — Telehealth: Payer: Self-pay | Admitting: Family Medicine

## 2023-01-26 DIAGNOSIS — E2839 Other primary ovarian failure: Secondary | ICD-10-CM

## 2023-01-26 DIAGNOSIS — S32018S Other fracture of first lumbar vertebra, sequela: Secondary | ICD-10-CM

## 2023-01-26 NOTE — Telephone Encounter (Signed)
Pt.notified

## 2023-01-26 NOTE — Telephone Encounter (Signed)
The order is in  Please give her # to schedule

## 2023-01-26 NOTE — Telephone Encounter (Signed)
Patient would like to be scheduled for bone density test.Would like for order to be sent out to [place].

## 2023-02-17 DIAGNOSIS — Z79899 Other long term (current) drug therapy: Secondary | ICD-10-CM | POA: Diagnosis not present

## 2023-02-17 DIAGNOSIS — C7802 Secondary malignant neoplasm of left lung: Secondary | ICD-10-CM | POA: Diagnosis not present

## 2023-02-17 DIAGNOSIS — M4856XA Collapsed vertebra, not elsewhere classified, lumbar region, initial encounter for fracture: Secondary | ICD-10-CM | POA: Diagnosis not present

## 2023-02-17 DIAGNOSIS — Z85118 Personal history of other malignant neoplasm of bronchus and lung: Secondary | ICD-10-CM | POA: Diagnosis not present

## 2023-02-17 DIAGNOSIS — M47816 Spondylosis without myelopathy or radiculopathy, lumbar region: Secondary | ICD-10-CM | POA: Diagnosis not present

## 2023-02-17 DIAGNOSIS — M48061 Spinal stenosis, lumbar region without neurogenic claudication: Secondary | ICD-10-CM | POA: Diagnosis not present

## 2023-02-17 DIAGNOSIS — Z9889 Other specified postprocedural states: Secondary | ICD-10-CM | POA: Diagnosis not present

## 2023-02-17 DIAGNOSIS — C7931 Secondary malignant neoplasm of brain: Secondary | ICD-10-CM | POA: Diagnosis not present

## 2023-02-17 DIAGNOSIS — R29890 Loss of height: Secondary | ICD-10-CM | POA: Diagnosis not present

## 2023-02-17 DIAGNOSIS — R911 Solitary pulmonary nodule: Secondary | ICD-10-CM | POA: Diagnosis not present

## 2023-02-17 DIAGNOSIS — C439 Malignant melanoma of skin, unspecified: Secondary | ICD-10-CM | POA: Diagnosis not present

## 2023-02-21 DIAGNOSIS — Z9289 Personal history of other medical treatment: Secondary | ICD-10-CM | POA: Diagnosis not present

## 2023-02-21 DIAGNOSIS — Z86711 Personal history of pulmonary embolism: Secondary | ICD-10-CM | POA: Diagnosis not present

## 2023-02-21 DIAGNOSIS — K521 Toxic gastroenteritis and colitis: Secondary | ICD-10-CM | POA: Diagnosis not present

## 2023-02-21 DIAGNOSIS — C7931 Secondary malignant neoplasm of brain: Secondary | ICD-10-CM | POA: Diagnosis not present

## 2023-02-21 DIAGNOSIS — G893 Neoplasm related pain (acute) (chronic): Secondary | ICD-10-CM | POA: Diagnosis not present

## 2023-02-21 DIAGNOSIS — C439 Malignant melanoma of skin, unspecified: Secondary | ICD-10-CM | POA: Diagnosis not present

## 2023-03-03 ENCOUNTER — Other Ambulatory Visit: Payer: Self-pay | Admitting: Family Medicine

## 2023-03-09 ENCOUNTER — Other Ambulatory Visit: Payer: Self-pay | Admitting: Family Medicine

## 2023-03-20 ENCOUNTER — Ambulatory Visit
Admission: RE | Admit: 2023-03-20 | Discharge: 2023-03-20 | Disposition: A | Discharge status: Home / Self Care | Payer: Medicare Other | Source: Ambulatory Visit | Attending: Family Medicine | Admitting: Family Medicine

## 2023-03-20 ENCOUNTER — Encounter: Payer: Self-pay | Admitting: Family Medicine

## 2023-03-20 DIAGNOSIS — E2839 Other primary ovarian failure: Secondary | ICD-10-CM | POA: Insufficient documentation

## 2023-03-20 DIAGNOSIS — J069 Acute upper respiratory infection, unspecified: Secondary | ICD-10-CM

## 2023-03-20 DIAGNOSIS — M8589 Other specified disorders of bone density and structure, multiple sites: Secondary | ICD-10-CM | POA: Diagnosis not present

## 2023-03-21 ENCOUNTER — Other Ambulatory Visit: Payer: Self-pay | Admitting: Family Medicine

## 2023-03-21 MED ORDER — BENZONATATE 100 MG PO CAPS: ORAL_CAPSULE | ORAL | 0 refills | Status: DC

## 2023-03-21 NOTE — Telephone Encounter (Signed)
I refilled the tessalon peales  She also needs to be seen by first available to see what may be causing he cough before they leave town  Please call and schedule (or recommend UC if nothing is available)

## 2023-03-22 ENCOUNTER — Telehealth: Payer: Self-pay

## 2023-03-22 ENCOUNTER — Encounter: Payer: Self-pay | Admitting: Internal Medicine

## 2023-03-22 ENCOUNTER — Ambulatory Visit: Payer: Medicare Other | Admitting: Internal Medicine

## 2023-03-22 ENCOUNTER — Ambulatory Visit (INDEPENDENT_AMBULATORY_CARE_PROVIDER_SITE_OTHER)
Admission: RE | Admit: 2023-03-22 | Discharge: 2023-03-22 | Disposition: A | Discharge status: Home / Self Care | Payer: Medicare Other | Source: Ambulatory Visit | Attending: Internal Medicine | Admitting: Internal Medicine

## 2023-03-22 VITALS — BP 122/72 | HR 72 | Temp 97.7°F | Resp 18 | Ht 60.5 in | Wt 175.0 lb

## 2023-03-22 DIAGNOSIS — J111 Influenza due to unidentified influenza virus with other respiratory manifestations: Secondary | ICD-10-CM | POA: Diagnosis not present

## 2023-03-22 DIAGNOSIS — J4531 Mild persistent asthma with (acute) exacerbation: Secondary | ICD-10-CM | POA: Diagnosis not present

## 2023-03-22 DIAGNOSIS — R053 Chronic cough: Secondary | ICD-10-CM

## 2023-03-22 DIAGNOSIS — R918 Other nonspecific abnormal finding of lung field: Secondary | ICD-10-CM | POA: Diagnosis not present

## 2023-03-22 MED ORDER — ALBUTEROL SULFATE HFA 108 (90 BASE) MCG/ACT IN AERS: 2.0000 | INHALATION_SPRAY | Freq: Four times a day (QID) | RESPIRATORY_TRACT | 0 refills | Status: DC | PRN

## 2023-03-22 MED ORDER — BENZONATATE 200 MG PO CAPS: 200.0000 mg | ORAL_CAPSULE | Freq: Three times a day (TID) | ORAL | 0 refills | Status: DC | PRN

## 2023-03-22 MED ORDER — DOXYCYCLINE HYCLATE 100 MG PO TABS: 100.0000 mg | ORAL_TABLET | Freq: Two times a day (BID) | ORAL | 0 refills | Status: DC

## 2023-03-22 MED ORDER — PREDNISONE 20 MG PO TABS: 40.0000 mg | ORAL_TABLET | Freq: Every day | ORAL | 0 refills | Status: DC

## 2023-03-22 NOTE — Assessment & Plan Note (Addendum)
Cough seems tight Will give prednisone for 6 days (40 x 3, 20 x 3) On breo already Will Rx albuterol for rescue

## 2023-03-22 NOTE — Assessment & Plan Note (Addendum)
Worse in the past week Cough sounds tight and she has sense of it being asthma related Past lung mass from melanoma---will check CXR to be sure no clear infiltrate CXR doesn't show infiltrate--but awaiting overread Will refill the benzonatate Doxy 100 bid x 7 days in case sinusitis

## 2023-03-22 NOTE — Progress Notes (Signed)
Subjective:    Patient ID: Andrea Peters, female    DOB: September 14, 1945, 76 y.o.   MRN: [id number]  HPI Here due to respiratory illness  Has been coughing for quite a while--about a month ago Now more constant--and can't lie down without the cough--over the past week Cough is dry Benzonatate has helped Not really SOB--but can hear her breathing Does have change in hearing--"can hear myself talking in the past few days" No ear pain  Known asthma--does take the breo daily Seems like asthma cough  Did have stage 4 melanoma --was in lungs Done with Rx Had nodes and lung mass--- chest CT 2/24 at [facility] only shows residual scar  Current Outpatient Medications on File Prior to Visit  Medication Sig Dispense Refill   acetaminophen (TYLENOL) 325 MG tablet Take by mouth.     allopurinol (ZYLOPRIM) 300 MG tablet TAKE 1 TABLET BY MOUTH EVERY DAY 90 tablet 1   amLODipine (NORVASC) 10 MG tablet TAKE 1 TABLET BY MOUTH EVERY DAY 90 tablet 1   apixaban (ELIQUIS) 5 MG TABS tablet Take 1 tablet (5 mg total) by mouth 2 (two) times daily. 180 tablet 1   benzonatate (TESSALON) 100 MG capsule Take 1-2 tablets 3 times a day as needed for cough 30 capsule 0   fluticasone (FLONASE) 50 MCG/ACT nasal spray SPRAY 2 SPRAYS INTO EACH NOSTRIL EVERY DAY 48 mL 1   fluticasone furoate-vilanterol (BREO ELLIPTA) 200-25 MCG/ACT AEPB USE 1 INHALATION ORALLY    DAILY 90 each 1   magnesium oxide (MAG-OX) 400 (240 Mg) MG tablet TAKE 1 TABLET (400 MG TOTAL) BY MOUTH IN THE MORNING AND AT BEDTIME. 180 tablet 1   Multiple Vitamin (MULTIVITAMIN) tablet Take 2 tablets by mouth daily.      simvastatin (ZOCOR) 20 MG tablet Take 1 tablet (20 mg total) by mouth at bedtime. 90 tablet 3   No current facility-administered medications on file prior to visit.    Allergies  Allergen Reactions   Alendronate     Hair loss  Nausea Diarrhea    Lipitor [Atorvastatin]     Muscle pain   Naproxen Sodium    Bioflavonoids Rash    Naproxen Other (See Comments) and Rash    Severe indigestion    Past Medical History:  Diagnosis Date   Allergic rhinitis    CHOLELITHIASIS, HX OF 11/17/2010   Qualifier: Diagnosis of  By: Milinda Antis MD, Colon Flattery    Dysplastic nevus 02/06/2013   R flank - moderate   Hyperglycemia    Hyperlipidemia    Hypertension    Melanoma ([facility]) 08/17/2012   back of right arm   Mixed incontinence    Obesity    Plantar fasciitis    with significant disability   Tendonitis of foot    L, chronic foot pain    Past Surgical History:  Procedure Laterality Date   ABDOMINAL HYSTERECTOMY     fibroids, ovaries intact   BREAST EXCISIONAL BIOPSY Bilateral 1960's -1970's   benign   EYE SURGERY Bilateral 12/2015   at [facility]   TENDON REPAIR  5/07   Tendon rupture L foot    Family History  Problem Relation Age of Onset   Heart attack Mother    Other Mother        ?thyroid problems   Lung cancer Father     Social History   Socioeconomic History   Marital status: Married    Spouse name: Not on file   Number of  children: Not on file   Years of education: Not on file   Highest education level: Not on file  Occupational History   Not on file  Tobacco Use   Smoking status: Never   Smokeless tobacco: Never  Vaping Use   Vaping status: Never Used  Substance and Sexual Activity   Alcohol use: No    Alcohol/week: 0.0 standard drinks of alcohol   Drug use: No   Sexual activity: Not on file  Other Topics Concern   Not on file  Social History Narrative   Cannot exercise to chronic foot pain   Social Determinants of Health   Financial Resource Strain: Low Risk  (06/28/2022)   Overall Financial Resource Strain (CARDIA)    Difficulty of Paying Living Expenses: Not hard at all  Food Insecurity: No Food Insecurity (06/28/2022)   Hunger Vital Sign    Worried About Running Out of Food in the Last Year: Never true    Ran Out of Food in the Last Year: Never true  Transportation Needs: No  Transportation Needs (06/28/2022)   PRAPARE - Administrator, Civil Service (Medical): No    Lack of Transportation (Non-Medical): No  Physical Activity: Sufficiently Active (06/28/2022)   Exercise Vital Sign    Days of Exercise per Week: 7 days    Minutes of Exercise per Session: 30 min  Stress: No Stress Concern Present (06/28/2022)   [employer] of Occupational Health - Occupational Stress Questionnaire    Feeling of Stress : Not at all  Social Connections: Not on file  Intimate Partner Violence: Not on file   Review of Systems No N/V Eating okay     Objective:   Physical Exam Constitutional:      Appearance: Normal appearance.  HENT:     Right Ear: Tympanic membrane and ear canal normal.     Left Ear: Tympanic membrane and ear canal normal.  Pulmonary:     Effort: Pulmonary effort is normal.     Breath sounds: No rales.     Comments: Frequent dry cough--worse with deep breath Some expiratory rhonchi/wheeze Musculoskeletal:     Cervical back: Neck supple.  Lymphadenopathy:     Cervical: No cervical adenopathy.  Neurological:     Mental Status: She is alert.            Assessment & Plan:

## 2023-03-22 NOTE — Telephone Encounter (Signed)
Pt was in to see Dr Alphonsus Sias for a sick visit. Requesting refill of allopurinol be sent to [facility].

## 2023-03-22 NOTE — Telephone Encounter (Signed)
I sent the pended prescription

## 2023-03-22 NOTE — Telephone Encounter (Signed)
Pt had a sick visit with Dr. Alphonsus Sias today

## 2023-04-11 DIAGNOSIS — D3142 Benign neoplasm of left ciliary body: Secondary | ICD-10-CM | POA: Diagnosis not present

## 2023-04-13 ENCOUNTER — Other Ambulatory Visit: Payer: Self-pay | Admitting: Internal Medicine

## 2023-04-18 DIAGNOSIS — Z85820 Personal history of malignant melanoma of skin: Secondary | ICD-10-CM | POA: Diagnosis not present

## 2023-04-18 DIAGNOSIS — M545 Low back pain, unspecified: Secondary | ICD-10-CM | POA: Diagnosis not present

## 2023-04-18 DIAGNOSIS — Z86711 Personal history of pulmonary embolism: Secondary | ICD-10-CM | POA: Diagnosis not present

## 2023-04-18 DIAGNOSIS — S32010D Wedge compression fracture of first lumbar vertebra, subsequent encounter for fracture with routine healing: Secondary | ICD-10-CM | POA: Diagnosis not present

## 2023-04-18 DIAGNOSIS — E669 Obesity, unspecified: Secondary | ICD-10-CM | POA: Diagnosis not present

## 2023-04-18 DIAGNOSIS — Z7951 Long term (current) use of inhaled steroids: Secondary | ICD-10-CM | POA: Diagnosis not present

## 2023-04-18 DIAGNOSIS — E785 Hyperlipidemia, unspecified: Secondary | ICD-10-CM | POA: Diagnosis not present

## 2023-04-18 DIAGNOSIS — X58XXXA Exposure to other specified factors, initial encounter: Secondary | ICD-10-CM | POA: Diagnosis not present

## 2023-04-18 DIAGNOSIS — M79661 Pain in right lower leg: Secondary | ICD-10-CM | POA: Diagnosis not present

## 2023-04-18 DIAGNOSIS — Z79899 Other long term (current) drug therapy: Secondary | ICD-10-CM | POA: Diagnosis not present

## 2023-04-18 DIAGNOSIS — Z9049 Acquired absence of other specified parts of digestive tract: Secondary | ICD-10-CM | POA: Diagnosis not present

## 2023-04-18 DIAGNOSIS — I10 Essential (primary) hypertension: Secondary | ICD-10-CM | POA: Diagnosis not present

## 2023-04-18 DIAGNOSIS — C7931 Secondary malignant neoplasm of brain: Secondary | ICD-10-CM | POA: Diagnosis not present

## 2023-04-18 DIAGNOSIS — J45909 Unspecified asthma, uncomplicated: Secondary | ICD-10-CM | POA: Diagnosis not present

## 2023-04-18 DIAGNOSIS — R600 Localized edema: Secondary | ICD-10-CM | POA: Diagnosis not present

## 2023-04-18 DIAGNOSIS — D6859 Other primary thrombophilia: Secondary | ICD-10-CM | POA: Diagnosis not present

## 2023-04-18 DIAGNOSIS — G8929 Other chronic pain: Secondary | ICD-10-CM | POA: Diagnosis not present

## 2023-04-18 DIAGNOSIS — S32010A Wedge compression fracture of first lumbar vertebra, initial encounter for closed fracture: Secondary | ICD-10-CM | POA: Diagnosis not present

## 2023-04-18 DIAGNOSIS — M79662 Pain in left lower leg: Secondary | ICD-10-CM | POA: Diagnosis not present

## 2023-04-18 DIAGNOSIS — Z86718 Personal history of other venous thrombosis and embolism: Secondary | ICD-10-CM | POA: Diagnosis not present

## 2023-04-27 ENCOUNTER — Encounter: Payer: Self-pay | Admitting: Family Medicine

## 2023-04-28 ENCOUNTER — Other Ambulatory Visit: Payer: Self-pay | Admitting: Family Medicine

## 2023-04-28 DIAGNOSIS — I2699 Other pulmonary embolism without acute cor pulmonale: Secondary | ICD-10-CM

## 2023-04-30 NOTE — Telephone Encounter (Signed)
It is ok to hold eliquis per protocol.  Past history of DVT and PE (should wear supp hose if able)  Per oncology note they were going to discuss when to stop this in sept anyway   Thanks for checking in

## 2023-05-01 NOTE — Telephone Encounter (Signed)
Last filled on 11/02/22 #180 tabs with 1 refill, will route to PCP for review given last mychart message

## 2023-05-03 ENCOUNTER — Telehealth: Payer: Self-pay | Admitting: Family Medicine

## 2023-05-03 MED ORDER — FLUTICASONE FUROATE-VILANTEROL 200-25 MCG/ACT IN AEPB: 1.0000 | INHALATION_SPRAY | Freq: Every day | RESPIRATORY_TRACT | 1 refills | Status: DC

## 2023-05-03 NOTE — Telephone Encounter (Signed)
Prescription Request  05/03/2023  LOV: 10/20/2022  What is the name of the medication or equipment? fluticasone furoate-vilanterol (BREO ELLIPTA) 200-25 MCG/ACT AEPB, patient completely out  Have you contacted your pharmacy to request a refill? Yes   Which pharmacy would you like this sent to?  [facility] - [place], [place] - [address] [facility] to [address] [place] [address] Phone: [phone] Fax: [phone]     Patient notified that their request is being sent to the clinical staff for review and that they should receive a response within 2 business days.   Please advise at Mobile [phone] (mobile)

## 2023-05-10 DIAGNOSIS — M48061 Spinal stenosis, lumbar region without neurogenic claudication: Secondary | ICD-10-CM | POA: Diagnosis not present

## 2023-05-10 DIAGNOSIS — X58XXXA Exposure to other specified factors, initial encounter: Secondary | ICD-10-CM | POA: Diagnosis not present

## 2023-05-10 DIAGNOSIS — G8929 Other chronic pain: Secondary | ICD-10-CM | POA: Diagnosis not present

## 2023-05-10 DIAGNOSIS — S32010A Wedge compression fracture of first lumbar vertebra, initial encounter for closed fracture: Secondary | ICD-10-CM | POA: Diagnosis not present

## 2023-05-10 DIAGNOSIS — M545 Low back pain, unspecified: Secondary | ICD-10-CM | POA: Diagnosis not present

## 2023-06-08 DIAGNOSIS — Z9289 Personal history of other medical treatment: Secondary | ICD-10-CM | POA: Diagnosis not present

## 2023-06-08 DIAGNOSIS — C439 Malignant melanoma of skin, unspecified: Secondary | ICD-10-CM | POA: Diagnosis not present

## 2023-06-08 DIAGNOSIS — R911 Solitary pulmonary nodule: Secondary | ICD-10-CM | POA: Diagnosis not present

## 2023-06-08 DIAGNOSIS — E43 Unspecified severe protein-calorie malnutrition: Secondary | ICD-10-CM | POA: Diagnosis not present

## 2023-06-08 DIAGNOSIS — K521 Toxic gastroenteritis and colitis: Secondary | ICD-10-CM | POA: Diagnosis not present

## 2023-06-08 DIAGNOSIS — C7931 Secondary malignant neoplasm of brain: Secondary | ICD-10-CM | POA: Diagnosis not present

## 2023-06-08 DIAGNOSIS — G893 Neoplasm related pain (acute) (chronic): Secondary | ICD-10-CM | POA: Diagnosis not present

## 2023-06-12 DIAGNOSIS — C439 Malignant melanoma of skin, unspecified: Secondary | ICD-10-CM | POA: Diagnosis not present

## 2023-06-12 DIAGNOSIS — C7931 Secondary malignant neoplasm of brain: Secondary | ICD-10-CM | POA: Diagnosis not present

## 2023-06-23 DIAGNOSIS — R911 Solitary pulmonary nodule: Secondary | ICD-10-CM | POA: Diagnosis not present

## 2023-06-23 DIAGNOSIS — I2699 Other pulmonary embolism without acute cor pulmonale: Secondary | ICD-10-CM | POA: Diagnosis not present

## 2023-06-23 DIAGNOSIS — C439 Malignant melanoma of skin, unspecified: Secondary | ICD-10-CM | POA: Diagnosis not present

## 2023-06-23 DIAGNOSIS — Z7901 Long term (current) use of anticoagulants: Secondary | ICD-10-CM | POA: Diagnosis not present

## 2023-06-26 ENCOUNTER — Telehealth: Payer: Self-pay

## 2023-06-26 NOTE — Telephone Encounter (Signed)
Scheduled f/u for 10/23

## 2023-06-26 NOTE — Telephone Encounter (Signed)
Sending note to Dr Milinda Antis and [employer].

## 2023-06-26 NOTE — Telephone Encounter (Signed)
Please schedule a follow up for blood pressure  Have her bring her cuff if she has one

## 2023-06-28 ENCOUNTER — Ambulatory Visit (INDEPENDENT_AMBULATORY_CARE_PROVIDER_SITE_OTHER): Payer: Medicare Other | Admitting: Family Medicine

## 2023-06-28 ENCOUNTER — Encounter: Payer: Self-pay | Admitting: Family Medicine

## 2023-06-28 VITALS — BP 141/70 | HR 73 | Temp 97.7°F | Ht 60.6 in | Wt 182.0 lb

## 2023-06-28 DIAGNOSIS — Z636 Dependent relative needing care at home: Secondary | ICD-10-CM

## 2023-06-28 DIAGNOSIS — I10 Essential (primary) hypertension: Secondary | ICD-10-CM | POA: Diagnosis not present

## 2023-06-28 DIAGNOSIS — C4360 Malignant melanoma of unspecified upper limb, including shoulder: Secondary | ICD-10-CM | POA: Diagnosis not present

## 2023-06-28 DIAGNOSIS — E7800 Pure hypercholesterolemia, unspecified: Secondary | ICD-10-CM | POA: Diagnosis not present

## 2023-06-28 MED ORDER — TELMISARTAN 80 MG PO TABS: 80.0000 mg | ORAL_TABLET | Freq: Every day | ORAL | 1 refills | Status: DC

## 2023-06-28 NOTE — Assessment & Plan Note (Signed)
Instructed to change dosing of simvastatin to evening   Disc goals for lipids and reasons to control them Rev last labs with pt Rev low sat fat diet in detail

## 2023-06-28 NOTE — Progress Notes (Signed)
Subjective:    Patient ID: Andrea Peters, female    DOB: [birth year], 77 y.o.   MRN: [id number]  HPI  Wt Readings from Last 3 Encounters:  06/28/23 182 lb (82.6 kg)  03/22/23 175 lb (79.4 kg)  10/20/22 167 lb (75.8 kg)   34.84 kg/m  Vitals:   06/28/23 1027 06/28/23 1045  BP: 138/84 (!) 141/70  Pulse: 73   Temp: 97.7 F (36.5 C)   SpO2: 95%     Pt presents for follow up of HTN    No cp or palpitations or headaches or edema  No side effects to medicines  BP Readings from Last 3 Encounters:  06/28/23 (!) 141/70  03/22/23 122/72  10/20/22 131/70    Takes amlodipine 10 mg daily   Has never checked blood pressure at home until recently   148/79 151/81 144/84 155/95 143/88 176/89 -standing    Lot of pain in knees and left hip also  Hard to get comfortable with scans    In the past took miacafdis hct 160-25 with addn of hydrochlorothiazide 25 mg daily (2022)   Lab Results  Component Value Date   NA 142 10/12/2022   K 3.9 10/12/2022   CO2 32 10/12/2022   GLUCOSE 131 (H) 10/12/2022   BUN 21 10/12/2022   CREATININE 0.87 10/12/2022   CALCIUM 10.1 10/12/2022   GFR 63.93 10/12/2022   GFRNONAA 53.45 03/15/2010  Cmet at [facility] on 10/3 Cr 0.8 Normal lytes GFR 75 Lab Results  Component Value Date   TSH 5.69 (H) 10/12/2022  TSH recent at [facility] 4.26  Recent normal cbc with Hb of 14.0 Plt 235  Lab Results  Component Value Date   HGBA1C 5.8 10/12/2022    In the process of treatment/follow up of malignant melanoma at [facility]  Reviewed notes/labs from 10/7 Investigating lung lesion - was getting a (fluoroscopic bx?) but her blood pressure went too high  Systolic was high  Anxious about what they are going find / what treatment options  Is re sched for mid November  (may need bx later)   No evid of mets in brain   Continues eliquis (PE in 2023)   Added stress Husband has dementia      Patient Active Problem List   Diagnosis Date Noted    Caregiver stress 06/28/2023   Persistent cough for 3 weeks or longer 03/22/2023   Mild persistent allergic asthma with acute exacerbation 03/22/2023   Knee pain 10/20/2022   Bursitis of left elbow 10/20/2022   Heart murmur 10/20/2022   L1 vertebral fracture (HCC) 03/15/2022   Cancer associated pain 03/15/2022   Hypomagnesemia 03/09/2022   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   Subclinical hypothyroidism 03/02/2021   Serum calcium elevated 11/05/2019   Prediabetes 10/26/2018   Fatigue 03/29/2016   Osteopenia 10/19/2015   Estrogen deficiency 10/15/2014   Melanoma of upper arm (HCC) 05/08/2013   Gout 11/17/2010   HYPERCHOLESTEROLEMIA, PURE 05/17/2007   Allergic rhinitis 01/23/2007   Essential hypertension 12/20/2006   Past Medical History:  Diagnosis Date   Allergic rhinitis    CHOLELITHIASIS, HX OF 11/17/2010   Qualifier: Diagnosis of  By: Milinda Antis MD, Colon Flattery    Dysplastic nevus 02/06/2013   R flank - moderate   Hyperglycemia    Hyperlipidemia    Hypertension    Melanoma (HCC) 08/17/2012   back of right arm   Mixed incontinence    Obesity    Plantar fasciitis    with significant disability  Tendonitis of foot    L, chronic foot pain   Past Surgical History:  Procedure Laterality Date   ABDOMINAL HYSTERECTOMY     fibroids, ovaries intact   BREAST EXCISIONAL BIOPSY Bilateral 1960's -1970's   benign   EYE SURGERY Bilateral 12/2015   at [facility]   TENDON REPAIR  5/07   Tendon rupture L foot   Social History   Tobacco Use   Smoking status: Never   Smokeless tobacco: Never  Vaping Use   Vaping status: Never Used  Substance Use Topics   Alcohol use: No    Alcohol/week: 0.0 standard drinks of alcohol   Drug use: No   Family History  Problem Relation Age of Onset   Heart attack Mother    Other Mother        ?thyroid problems   Lung cancer Father    Allergies  Allergen Reactions   Alendronate     Hair loss  Nausea Diarrhea    Lipitor [Atorvastatin]     Muscle pain    Naproxen Sodium    Bioflavonoids Rash   Naproxen Other (See Comments) and Rash    Severe indigestion   Current Outpatient Medications on File Prior to Visit  Medication Sig Dispense Refill   acetaminophen (TYLENOL) 325 MG tablet Take by mouth.     albuterol (VENTOLIN HFA) 108 (90 Base) MCG/ACT inhaler TAKE 2 PUFFS BY MOUTH EVERY 6 HOURS AS NEEDED FOR WHEEZE OR SHORTNESS OF BREATH 18 each 2   allopurinol (ZYLOPRIM) 300 MG tablet TAKE 1 TABLET BY MOUTH EVERY DAY 90 tablet 2   amLODipine (NORVASC) 10 MG tablet TAKE 1 TABLET BY MOUTH EVERY DAY 90 tablet 1   apixaban (ELIQUIS) 5 MG TABS tablet TAKE 1 TABLET TWICE A DAY 180 tablet 0   fluticasone (FLONASE) 50 MCG/ACT nasal spray SPRAY 2 SPRAYS INTO EACH NOSTRIL EVERY DAY 48 mL 1   fluticasone furoate-vilanterol (BREO ELLIPTA) 200-25 MCG/ACT AEPB Inhale 1 puff into the lungs daily. 90 each 1   magnesium oxide (MAG-OX) 400 (240 Mg) MG tablet TAKE 1 TABLET (400 MG TOTAL) BY MOUTH IN THE MORNING AND AT BEDTIME. 180 tablet 1   Multiple Vitamin (MULTIVITAMIN) tablet Take 2 tablets by mouth daily.      simvastatin (ZOCOR) 20 MG tablet Take 1 tablet (20 mg total) by mouth at bedtime. 90 tablet 3   No current facility-administered medications on file prior to visit.    Review of Systems  Constitutional:  Positive for fatigue. Negative for activity change, appetite change, fever and unexpected weight change.  HENT:  Negative for congestion, ear pain, rhinorrhea, sinus pressure and sore throat.   Eyes:  Negative for pain, redness and visual disturbance.  Respiratory:  Negative for cough, shortness of breath and wheezing.   Cardiovascular:  Negative for chest pain and palpitations.  Gastrointestinal:  Negative for abdominal pain, blood in stool, constipation and diarrhea.  Endocrine: Negative for polydipsia and polyuria.  Genitourinary:  Negative for dysuria, frequency and urgency.  Musculoskeletal:  Negative for arthralgias, back pain and myalgias.   Skin:  Negative for pallor and rash.  Allergic/Immunologic: Negative for environmental allergies.  Neurological:  Negative for dizziness, syncope and headaches.  Hematological:  Negative for adenopathy. Does not bruise/bleed easily.  Psychiatric/Behavioral:  Negative for decreased concentration and dysphoric mood. The patient is nervous/anxious.        Objective:   Physical Exam Constitutional:      General: She is not in acute distress.  Appearance: Normal appearance. She is well-developed. She is obese. She is not ill-appearing or diaphoretic.  HENT:     Head: Normocephalic and atraumatic.  Eyes:     Conjunctiva/sclera: Conjunctivae normal.     Pupils: Pupils are equal, round, and reactive to light.  Neck:     Thyroid: No thyromegaly.     Vascular: No carotid bruit or JVD.  Cardiovascular:     Rate and Rhythm: Normal rate and regular rhythm.     Heart sounds: Normal heart sounds.     No gallop.  Pulmonary:     Effort: Pulmonary effort is normal. No respiratory distress.     Breath sounds: Normal breath sounds. No wheezing or rales.  Abdominal:     General: There is no distension or abdominal bruit.     Palpations: Abdomen is soft.  Musculoskeletal:     Cervical back: Normal range of motion and neck supple.     Right lower leg: No edema.     Left lower leg: No edema.     Comments: Puffy ankles without pitting edema   Lymphadenopathy:     Cervical: No cervical adenopathy.  Skin:    General: Skin is warm and dry.     Coloration: Skin is not pale.     Findings: No rash.  Neurological:     Mental Status: She is alert.     Coordination: Coordination normal.     Deep Tendon Reflexes: Reflexes are normal and symmetric. Reflexes normal.  Psychiatric:        Mood and Affect: Mood normal.           Assessment & Plan:   Problem List Items Addressed This Visit       Cardiovascular and Mediastinum   Essential hypertension - Primary     Blood pressure has been on  the rise with stress/ weight gain/ lack of exercise  BP: (!) 141/70  Higher at times at home but needs new cuff and better technique (see AVS and handouts)  Blood pressure was very high at oncology - had skipped medicine and was anxious and in pain (not surprising)- was re sched for nov   Taking amlodipine 10 mg daily  Today will add miacardis 80 mg daily and monitor at home  Instructed to call if side effects (doubt this/ has taken before)  Follow up 2-3 wk   Discussed lifestyle change in detail  Reviewed recent notes from oncology as well as scans and labs       Relevant Medications   telmisartan (MICARDIS) 80 MG tablet     Other   Caregiver stress    Husband with alz dementia       HYPERCHOLESTEROLEMIA, PURE    Instructed to change dosing of simvastatin to evening   Disc goals for lipids and reasons to control them Rev last labs with pt Rev low sat fat diet in detail       Relevant Medications   telmisartan (MICARDIS) 80 MG tablet   Melanoma of upper arm (HCC)    With history of mets  Reviewed oncology notes/ studies and labs in detail today   Planning bx of lung once blood pressure is down  Brain is clear  Feeling fair

## 2023-06-28 NOTE — Assessment & Plan Note (Signed)
With history of mets  Reviewed oncology notes/ studies and labs in detail today   Planning bx of lung once blood pressure is down  Brain is clear  Feeling fair

## 2023-06-28 NOTE — Assessment & Plan Note (Signed)
Husband with alz dementia

## 2023-06-28 NOTE — Assessment & Plan Note (Signed)
Blood pressure has been on the rise with stress/ weight gain/ lack of exercise  BP: (!) 141/70  Higher at times at home but needs new cuff and better technique (see AVS and handouts)  Blood pressure was very high at oncology - had skipped medicine and was anxious and in pain (not surprising)- was re sched for nov   Taking amlodipine 10 mg daily  Today will add miacardis 80 mg daily and monitor at home  Instructed to call if side effects (doubt this/ has taken before)  Follow up 2-3 wk   Discussed lifestyle change in detail  Reviewed recent notes from oncology as well as scans and labs

## 2023-06-28 NOTE — Patient Instructions (Addendum)
If you want to get a new blood pressure cuff OMRON for arm size large   Never check blood pressure when anxious or when you have just exercised/exerted yourself   Change your cholesterol medicine to evening   Continue amlodipine Add the generic miacardis 80 mg daily   Follow up here in 2-3 weeks

## 2023-07-12 ENCOUNTER — Encounter: Payer: Self-pay | Admitting: Family Medicine

## 2023-07-12 ENCOUNTER — Ambulatory Visit (INDEPENDENT_AMBULATORY_CARE_PROVIDER_SITE_OTHER): Payer: Medicare Other | Admitting: Family Medicine

## 2023-07-12 VITALS — BP 145/75 | HR 65 | Temp 97.9°F | Ht 60.5 in | Wt 183.2 lb

## 2023-07-12 DIAGNOSIS — I10 Essential (primary) hypertension: Secondary | ICD-10-CM | POA: Diagnosis not present

## 2023-07-12 MED ORDER — TELMISARTAN-HCTZ 80-25 MG PO TABS: 1.0000 | ORAL_TABLET | Freq: Every day | ORAL | 0 refills | Status: DC

## 2023-07-12 NOTE — Progress Notes (Signed)
Subjective:    Patient ID: Andrea Peters, female    DOB: 30-Jul-1946, 77 y.o.   MRN: [id number]  HPI  Wt Readings from Last 3 Encounters:  07/12/23 183 lb 4 oz (83.1 kg)  06/28/23 182 lb (82.6 kg)  03/22/23 175 lb (79.4 kg)   35.20 kg/m  Vitals:   07/12/23 1025 07/12/23 1045  BP: (!) 146/74 (!) 145/75  Pulse: 65   Temp: 97.9 F (36.6 C)   SpO2: 96%     Pt presents for follow up of HTN    No cp or palpitations or headaches or edema  No side effects to medicines  BP Readings from Last 3 Encounters:  07/12/23 (!) 145/75  06/28/23 (!) 141/70  03/22/23 122/72     Amlodipine 10 mg daily  Last visit added miacardis 80 mg daily  Monitoring at home -averages in 140s systolic overall Diastolic in 40J  Coming down gradually   Eating well  Avoids processed foods   Husband is on low sodium diet also   Lab Results  Component Value Date   NA 142 10/12/2022   K 3.9 10/12/2022   CO2 32 10/12/2022   GLUCOSE 131 (H) 10/12/2022   BUN 21 10/12/2022   CREATININE 0.87 10/12/2022   CALCIUM 10.1 10/12/2022   GFR 63.93 10/12/2022   GFRNONAA 53.45 03/15/2010   Arthritis is bad  Knees are really bad  Lumbar   Had inj for sciatic nerve in past   Lab Results  Component Value Date   ALT 16 10/12/2022   AST 18 10/12/2022   ALKPHOS 103 10/12/2022   BILITOT 0.5 10/12/2022     Patient Active Problem List   Diagnosis Date Noted   Caregiver stress 06/28/2023   Persistent cough for 3 weeks or longer 03/22/2023   Mild persistent allergic asthma with acute exacerbation 03/22/2023   Knee pain 10/20/2022   Bursitis of left elbow 10/20/2022   Heart murmur 10/20/2022   L1 vertebral fracture (HCC) 03/15/2022   Cancer associated pain 03/15/2022   Hypomagnesemia 03/09/2022   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   Subclinical hypothyroidism 03/02/2021   Serum calcium elevated 11/05/2019   Prediabetes 10/26/2018   Fatigue 03/29/2016   Osteopenia 10/19/2015   Estrogen deficiency  10/15/2014   Melanoma of upper arm (HCC) 05/08/2013   Gout 11/17/2010   HYPERCHOLESTEROLEMIA, PURE 05/17/2007   Allergic rhinitis 01/23/2007   Essential hypertension 12/20/2006   Past Medical History:  Diagnosis Date   Allergic rhinitis    CHOLELITHIASIS, HX OF 11/17/2010   Qualifier: Diagnosis of  By: Milinda Antis MD, Colon Flattery    Dysplastic nevus 02/06/2013   R flank - moderate   Hyperglycemia    Hyperlipidemia    Hypertension    Melanoma (HCC) 08/17/2012   back of right arm   Mixed incontinence    Obesity    Plantar fasciitis    with significant disability   Tendonitis of foot    L, chronic foot pain   Past Surgical History:  Procedure Laterality Date   ABDOMINAL HYSTERECTOMY     fibroids, ovaries intact   BREAST EXCISIONAL BIOPSY Bilateral 1960's -1970's   benign   EYE SURGERY Bilateral 12/2015   at [facility]   TENDON REPAIR  5/07   Tendon rupture L foot   Social History   Tobacco Use   Smoking status: Never   Smokeless tobacco: Never  Vaping Use   Vaping status: Never Used  Substance Use Topics   Alcohol use: No  Alcohol/week: 0.0 standard drinks of alcohol   Drug use: No   Family History  Problem Relation Age of Onset   Heart attack Mother    Other Mother        ?thyroid problems   Lung cancer Father    Allergies  Allergen Reactions   Alendronate     Hair loss  Nausea Diarrhea    Lipitor [Atorvastatin]     Muscle pain   Naproxen Sodium    Bioflavonoids Rash   Iodinated Contrast Media Rash and Swelling    On the evening after CT scan, mild rash noted on bilateral lower legs stopping at ankles, not itchy, along with mild swelling of bilateral lower legs/ankles/feet   Naproxen Other (See Comments) and Rash    Severe indigestion   Current Outpatient Medications on File Prior to Visit  Medication Sig Dispense Refill   acetaminophen (TYLENOL) 325 MG tablet Take by mouth.     albuterol (VENTOLIN HFA) 108 (90 Base) MCG/ACT inhaler TAKE 2 PUFFS BY MOUTH  EVERY 6 HOURS AS NEEDED FOR WHEEZE OR SHORTNESS OF BREATH 18 each 2   allopurinol (ZYLOPRIM) 300 MG tablet TAKE 1 TABLET BY MOUTH EVERY DAY 90 tablet 2   amLODipine (NORVASC) 10 MG tablet TAKE 1 TABLET BY MOUTH EVERY DAY 90 tablet 1   apixaban (ELIQUIS) 5 MG TABS tablet TAKE 1 TABLET TWICE A DAY 180 tablet 0   fluticasone (FLONASE) 50 MCG/ACT nasal spray SPRAY 2 SPRAYS INTO EACH NOSTRIL EVERY DAY 48 mL 1   fluticasone furoate-vilanterol (BREO ELLIPTA) 200-25 MCG/ACT AEPB Inhale 1 puff into the lungs daily. 90 each 1   magnesium oxide (MAG-OX) 400 (240 Mg) MG tablet TAKE 1 TABLET (400 MG TOTAL) BY MOUTH IN THE MORNING AND AT BEDTIME. 180 tablet 1   Multiple Vitamin (MULTIVITAMIN) tablet Take 2 tablets by mouth daily.      simvastatin (ZOCOR) 20 MG tablet Take 1 tablet (20 mg total) by mouth at bedtime. 90 tablet 3   No current facility-administered medications on file prior to visit.    Review of Systems  Constitutional:  Negative for activity change, appetite change, fatigue, fever and unexpected weight change.  HENT:  Negative for congestion, ear pain, rhinorrhea, sinus pressure and sore throat.   Eyes:  Negative for pain, redness and visual disturbance.  Respiratory:  Negative for cough, shortness of breath and wheezing.   Cardiovascular:  Negative for chest pain and palpitations.  Gastrointestinal:  Negative for abdominal pain, blood in stool, constipation and diarrhea.  Endocrine: Negative for polydipsia and polyuria.  Genitourinary:  Negative for dysuria, frequency and urgency.  Musculoskeletal:  Negative for arthralgias, back pain and myalgias.  Skin:  Negative for pallor and rash.  Allergic/Immunologic: Negative for environmental allergies.  Neurological:  Negative for dizziness, syncope and headaches.  Hematological:  Negative for adenopathy. Does not bruise/bleed easily.  Psychiatric/Behavioral:  Negative for decreased concentration and dysphoric mood. The patient is  nervous/anxious.        Objective:   Physical Exam Constitutional:      General: She is not in acute distress.    Appearance: Normal appearance. She is well-developed. She is obese. She is not ill-appearing or diaphoretic.  HENT:     Head: Normocephalic and atraumatic.  Eyes:     Conjunctiva/sclera: Conjunctivae normal.     Pupils: Pupils are equal, round, and reactive to light.  Neck:     Thyroid: No thyromegaly.     Vascular: No carotid bruit or  JVD.  Cardiovascular:     Rate and Rhythm: Normal rate and regular rhythm.     Heart sounds: Normal heart sounds.     No gallop.  Pulmonary:     Effort: Pulmonary effort is normal. No respiratory distress.     Breath sounds: Normal breath sounds. No wheezing or rales.  Abdominal:     General: There is no distension or abdominal bruit.     Palpations: Abdomen is soft.  Musculoskeletal:     Cervical back: Normal range of motion and neck supple.     Right lower leg: No edema.     Left lower leg: No edema.  Lymphadenopathy:     Cervical: No cervical adenopathy.  Skin:    General: Skin is warm and dry.     Coloration: Skin is not pale.     Findings: No rash.  Neurological:     Mental Status: She is alert.     Coordination: Coordination normal.     Deep Tendon Reflexes: Reflexes are normal and symmetric. Reflexes normal.  Psychiatric:        Mood and Affect: Mood is anxious (.).     Comments: Candidly discusses symptoms and stressors             Assessment & Plan:   Problem List Items Addressed This Visit       Cardiovascular and Mediastinum   Essential hypertension - Primary    BP: (!) 145/75  Still not in optimal control  Need to get down for lung bx end of next wk  Will change miacardis 80 to miacardis hct 80-25 mg  Has taken before  Ins to call if side effects  Also continue amlodipine 10 mg daily   Monitor at home  Lifestyle change discussed  Follow up in 1 wk for visit/lab       Relevant Medications    telmisartan-hydrochlorothiazide (MICARDIS HCT) 80-25 MG tablet

## 2023-07-12 NOTE — Patient Instructions (Addendum)
Stop the current miacardis   Start miacardis hct 80-25 mg daily (this has a diuretic added to it) You will urinate more  This may help the ankle swelling   If any side effects or problems let us know   Use voltaren gel 1% over the counter for painful joints  Tylenol as needed   Follow up next week before your biopsy   Take care of yourself

## 2023-07-12 NOTE — Assessment & Plan Note (Addendum)
BP: (!) 145/75  Still not in optimal control  Need to get down for lung bx end of next wk  Will change miacardis 80 to miacardis hct 80-25 mg  Has taken before  Ins to call if side effects  Also continue amlodipine 10 mg daily   Monitor at home  Lifestyle change discussed  Follow up in 1 wk for visit/lab

## 2023-07-18 ENCOUNTER — Ambulatory Visit (INDEPENDENT_AMBULATORY_CARE_PROVIDER_SITE_OTHER): Payer: Medicare Other | Admitting: Family Medicine

## 2023-07-18 ENCOUNTER — Encounter: Payer: Self-pay | Admitting: Family Medicine

## 2023-07-18 VITALS — BP 128/70 | HR 70 | Temp 97.9°F | Ht 60.5 in | Wt 182.2 lb

## 2023-07-18 DIAGNOSIS — C4360 Malignant melanoma of unspecified upper limb, including shoulder: Secondary | ICD-10-CM

## 2023-07-18 DIAGNOSIS — I10 Essential (primary) hypertension: Secondary | ICD-10-CM

## 2023-07-18 LAB — BASIC METABOLIC PANEL
BUN: 21 mg/dL (ref 6–23)
CO2: 32 meq/L (ref 19–32)
Calcium: 10.2 mg/dL (ref 8.4–10.5)
Chloride: 99 meq/L (ref 96–112)
Creatinine, Ser: 0.96 mg/dL (ref 0.40–1.20)
GFR: 56.51 mL/min — ABNORMAL LOW (ref >60.00)
Glucose, Bld: 101 mg/dL — ABNORMAL HIGH (ref 70–99)
Potassium: 3.7 meq/L (ref 3.5–5.1)
Sodium: 142 meq/L (ref 135–145)

## 2023-07-18 NOTE — Assessment & Plan Note (Signed)
Blood pressure is better bp in fair control at this time  BP Readings from Last 1 Encounters:  07/18/23 128/70   No changes needed Most recent labs reviewed  Disc lifstyle change with low sodium diet and exercise  Plan to continue miacardis hct 80-25 mg daily  Amlodipine 10 mg daily  Labs today

## 2023-07-18 NOTE — Patient Instructions (Addendum)
Labs today   Blood pressure is much better Continue current medicine   When you go for your procedure make sure they let you relax and take blood pressure several times   Take care of yourself   Good luck with your procedure

## 2023-07-18 NOTE — Progress Notes (Signed)
Subjective:    Patient ID: Andrea Peters, female    DOB: 06-10-1946, 77 y.o.   MRN: [id number]  HPI  Wt Readings from Last 3 Encounters:  07/18/23 182 lb 4 oz (82.7 kg)  07/12/23 183 lb 4 oz (83.1 kg)  06/28/23 182 lb (82.6 kg)   35.01 kg/m  Vitals:   07/18/23 0930 07/18/23 0948  BP: (!) 146/72 128/70  Pulse: 70   Temp: 97.9 F (36.6 C)   SpO2: 92%    Pt presents for follow up of HTN    No cp or palpitatios or headaches or edema  No side effects to medicines  BP Readings from Last 3 Encounters:  07/18/23 128/70  07/12/23 (!) 145/75  06/28/23 (!) 141/70    At home last blood pressure 139/75 Prior to that 150s then 140s systolic   Last visit changed miacardis 80 to miacardis hct 80-25 mg daily Amlodipine 10 mg daily   Has her biopsy scheduled Friday   Lab Results  Component Value Date   NA 142 10/12/2022   K 3.9 10/12/2022   CO2 32 10/12/2022   GLUCOSE 131 (H) 10/12/2022   BUN 21 10/12/2022   CREATININE 0.87 10/12/2022   CALCIUM 10.1 10/12/2022   GFR 63.93 10/12/2022   GFRNONAA 53.45 03/15/2010   GFR was 75 at [facility] more recently     Patient Active Problem List   Diagnosis Date Noted   Caregiver stress 06/28/2023   Persistent cough for 3 weeks or longer 03/22/2023   Mild persistent allergic asthma with acute exacerbation 03/22/2023   Knee pain 10/20/2022   Bursitis of left elbow 10/20/2022   Heart murmur 10/20/2022   L1 vertebral fracture (HCC) 03/15/2022   Cancer associated pain 03/15/2022   Hypomagnesemia 03/09/2022   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   Subclinical hypothyroidism 03/02/2021   Serum calcium elevated 11/05/2019   Prediabetes 10/26/2018   Fatigue 03/29/2016   Osteopenia 10/19/2015   Estrogen deficiency 10/15/2014   Melanoma of upper arm (HCC) 05/08/2013   Gout 11/17/2010   HYPERCHOLESTEROLEMIA, PURE 05/17/2007   Allergic rhinitis 01/23/2007   Essential hypertension 12/20/2006   Past Medical History:  Diagnosis Date    Allergic rhinitis    CHOLELITHIASIS, HX OF 11/17/2010   Qualifier: Diagnosis of  By: Milinda Antis MD, Colon Flattery    Dysplastic nevus 02/06/2013   R flank - moderate   Hyperglycemia    Hyperlipidemia    Hypertension    Melanoma (HCC) 08/17/2012   back of right arm   Mixed incontinence    Obesity    Plantar fasciitis    with significant disability   Tendonitis of foot    L, chronic foot pain   Past Surgical History:  Procedure Laterality Date   ABDOMINAL HYSTERECTOMY     fibroids, ovaries intact   BREAST EXCISIONAL BIOPSY Bilateral 1960's -1970's   benign   EYE SURGERY Bilateral 12/2015   at [facility]   TENDON REPAIR  5/07   Tendon rupture L foot   Social History   Tobacco Use   Smoking status: Never   Smokeless tobacco: Never  Vaping Use   Vaping status: Never Used  Substance Use Topics   Alcohol use: No    Alcohol/week: 0.0 standard drinks of alcohol   Drug use: No   Family History  Problem Relation Age of Onset   Heart attack Mother    Other Mother        ?thyroid problems   Lung cancer Father  Allergies  Allergen Reactions   Alendronate     Hair loss  Nausea Diarrhea    Lipitor [Atorvastatin]     Muscle pain   Naproxen Sodium    Bioflavonoids Rash   Iodinated Contrast Media Rash and Swelling    On the evening after CT scan, mild rash noted on bilateral lower legs stopping at ankles, not itchy, along with mild swelling of bilateral lower legs/ankles/feet   Naproxen Other (See Comments) and Rash    Severe indigestion   Current Outpatient Medications on File Prior to Visit  Medication Sig Dispense Refill   acetaminophen (TYLENOL) 325 MG tablet Take by mouth.     albuterol (VENTOLIN HFA) 108 (90 Base) MCG/ACT inhaler TAKE 2 PUFFS BY MOUTH EVERY 6 HOURS AS NEEDED FOR WHEEZE OR SHORTNESS OF BREATH 18 each 2   allopurinol (ZYLOPRIM) 300 MG tablet TAKE 1 TABLET BY MOUTH EVERY DAY 90 tablet 2   amLODipine (NORVASC) 10 MG tablet TAKE 1 TABLET BY MOUTH EVERY DAY 90 tablet  1   apixaban (ELIQUIS) 5 MG TABS tablet TAKE 1 TABLET TWICE A DAY 180 tablet 0   fluticasone (FLONASE) 50 MCG/ACT nasal spray SPRAY 2 SPRAYS INTO EACH NOSTRIL EVERY DAY 48 mL 1   fluticasone furoate-vilanterol (BREO ELLIPTA) 200-25 MCG/ACT AEPB Inhale 1 puff into the lungs daily. 90 each 1   magnesium oxide (MAG-OX) 400 (240 Mg) MG tablet TAKE 1 TABLET (400 MG TOTAL) BY MOUTH IN THE MORNING AND AT BEDTIME. 180 tablet 1   Multiple Vitamin (MULTIVITAMIN) tablet Take 2 tablets by mouth daily.      simvastatin (ZOCOR) 20 MG tablet Take 1 tablet (20 mg total) by mouth at bedtime. 90 tablet 3   telmisartan-hydrochlorothiazide (MICARDIS HCT) 80-25 MG tablet Take 1 tablet by mouth daily. 90 tablet 0   No current facility-administered medications on file prior to visit.    Review of Systems  Constitutional:  Negative for activity change, appetite change, fatigue, fever and unexpected weight change.  HENT:  Negative for congestion, ear pain, rhinorrhea, sinus pressure and sore throat.   Eyes:  Negative for pain, redness and visual disturbance.  Respiratory:  Negative for cough, shortness of breath and wheezing.   Cardiovascular:  Negative for chest pain and palpitations.  Gastrointestinal:  Negative for abdominal pain, blood in stool, constipation and diarrhea.  Endocrine: Negative for polydipsia and polyuria.  Genitourinary:  Negative for dysuria, frequency and urgency.  Musculoskeletal:  Negative for arthralgias, back pain and myalgias.  Skin:  Negative for pallor and rash.  Allergic/Immunologic: Negative for environmental allergies.  Neurological:  Negative for dizziness, syncope and headaches.  Hematological:  Negative for adenopathy. Does not bruise/bleed easily.  Psychiatric/Behavioral:  Negative for decreased concentration and dysphoric mood. The patient is nervous/anxious.        Objective:   Physical Exam Constitutional:      General: She is not in acute distress.    Appearance:  Normal appearance. She is well-developed. She is obese. She is not ill-appearing or diaphoretic.  HENT:     Head: Normocephalic and atraumatic.  Eyes:     Conjunctiva/sclera: Conjunctivae normal.     Pupils: Pupils are equal, round, and reactive to light.  Neck:     Thyroid: No thyromegaly.     Vascular: No carotid bruit or JVD.  Cardiovascular:     Rate and Rhythm: Normal rate and regular rhythm.     Heart sounds: Normal heart sounds.     No gallop.  Pulmonary:     Effort: Pulmonary effort is normal. No respiratory distress.     Breath sounds: Normal breath sounds. No wheezing or rales.  Abdominal:     General: There is no distension or abdominal bruit.     Palpations: Abdomen is soft.  Musculoskeletal:     Cervical back: Normal range of motion and neck supple.     Right lower leg: No edema.     Left lower leg: No edema.  Lymphadenopathy:     Cervical: No cervical adenopathy.  Skin:    General: Skin is warm and dry.     Coloration: Skin is not pale.     Findings: No rash.  Neurological:     Mental Status: She is alert.     Coordination: Coordination normal.     Deep Tendon Reflexes: Reflexes are normal and symmetric. Reflexes normal.  Psychiatric:        Mood and Affect: Mood normal.           Assessment & Plan:   Problem List Items Addressed This Visit       Cardiovascular and Mediastinum   Essential hypertension - Primary    Blood pressure is better bp in fair control at this time  BP Readings from Last 1 Encounters:  07/18/23 128/70   No changes needed Most recent labs reviewed  Disc lifstyle change with low sodium diet and exercise  Plan to continue miacardis hct 80-25 mg daily  Amlodipine 10 mg daily  Labs today         Relevant Orders   Basic metabolic panel     Other   Melanoma of upper arm (HCC)    Lung bx is upcoming on Friday  Will know more after that  Pt is anxious about it

## 2023-07-18 NOTE — Assessment & Plan Note (Signed)
Lung bx is upcoming on Friday  Will know more after that  Pt is anxious about it

## 2023-07-21 DIAGNOSIS — Z7901 Long term (current) use of anticoagulants: Secondary | ICD-10-CM | POA: Diagnosis not present

## 2023-07-21 DIAGNOSIS — R911 Solitary pulmonary nodule: Secondary | ICD-10-CM | POA: Diagnosis not present

## 2023-07-21 DIAGNOSIS — Z01812 Encounter for preprocedural laboratory examination: Secondary | ICD-10-CM | POA: Diagnosis not present

## 2023-07-21 DIAGNOSIS — C439 Malignant melanoma of skin, unspecified: Secondary | ICD-10-CM | POA: Diagnosis not present

## 2023-07-21 DIAGNOSIS — C7802 Secondary malignant neoplasm of left lung: Secondary | ICD-10-CM | POA: Diagnosis not present

## 2023-07-21 DIAGNOSIS — Z5181 Encounter for therapeutic drug level monitoring: Secondary | ICD-10-CM | POA: Diagnosis not present

## 2023-07-25 ENCOUNTER — Other Ambulatory Visit: Payer: Self-pay | Admitting: *Deleted

## 2023-07-25 DIAGNOSIS — I2699 Other pulmonary embolism without acute cor pulmonale: Secondary | ICD-10-CM

## 2023-07-25 MED ORDER — APIXABAN 5 MG PO TABS: 5.0000 mg | ORAL_TABLET | Freq: Two times a day (BID) | ORAL | 1 refills | Status: DC

## 2023-07-25 NOTE — Telephone Encounter (Signed)
CPE scheduled 10/23/23  Last filled on 05/02/23 #180 tabs/ 0 refills  [facility]

## 2023-08-01 DIAGNOSIS — R530 Neoplastic (malignant) related fatigue: Secondary | ICD-10-CM | POA: Diagnosis not present

## 2023-08-01 DIAGNOSIS — C7800 Secondary malignant neoplasm of unspecified lung: Secondary | ICD-10-CM | POA: Diagnosis not present

## 2023-08-01 DIAGNOSIS — C439 Malignant melanoma of skin, unspecified: Secondary | ICD-10-CM | POA: Diagnosis not present

## 2023-08-02 DIAGNOSIS — R530 Neoplastic (malignant) related fatigue: Secondary | ICD-10-CM | POA: Diagnosis not present

## 2023-08-07 ENCOUNTER — Ambulatory Visit (INDEPENDENT_AMBULATORY_CARE_PROVIDER_SITE_OTHER): Payer: Medicare Other

## 2023-08-07 VITALS — Ht 61.0 in | Wt 182.0 lb

## 2023-08-07 DIAGNOSIS — Z0000 Encounter for general adult medical examination without abnormal findings: Secondary | ICD-10-CM

## 2023-08-07 NOTE — Patient Instructions (Signed)
Ms. Andrea Peters , Thank you for taking time to come for your Medicare Wellness Visit. I appreciate your ongoing commitment to your health goals. Please review the following plan we discussed and let me know if I can assist you in the future.   Referrals/Orders/Follow-Ups/Clinician Recommendations: none  This is a list of the screening recommended for you and due dates:  Health Maintenance  Topic Date Due   Zoster (Shingles) Vaccine (1 of 2) 10/12/2023*   Flu Shot  12/04/2023*   DTaP/Tdap/Td vaccine (3 - Tdap) 07/11/2024*   COVID-19 Vaccine (3 - Pfizer risk series) 08/02/2024*   Mammogram  09/27/2023   Medicare Annual Wellness Visit  08/06/2024   Pneumonia Vaccine  Completed   DEXA scan (bone density measurement)  Completed   Hepatitis C Screening  Completed   HPV Vaccine  Aged Out   Colon Cancer Screening  Discontinued  *Topic was postponed. The date shown is not the original due date.    Advanced directives: (Copy Requested) Please bring a copy of your health care power of attorney and living will to the office to be added to your chart at your convenience.  Next Medicare Annual Wellness Visit scheduled for next year: Yes 08/07/2024 @ 3pm telephone

## 2023-08-07 NOTE — Progress Notes (Signed)
Subjective:   Andrea Peters is a 77 y.o. female who presents for Medicare Annual (Subsequent) preventive examination.  Visit Complete: Virtual I connected with  Andrea Peters on 08/07/23 by a audio enabled telemedicine application and verified that I am speaking with the correct person using two identifiers.  Patient Location: Home  Provider Location: Office/Clinic  I discussed the limitations of evaluation and management by telemedicine. The patient expressed understanding and agreed to proceed.  Vital Signs: Because this visit was a virtual/telehealth visit, some criteria may be missing or patient reported. Any vitals not documented were not able to be obtained and vitals that have been documented are patient reported.  Patient Medicare AWV questionnaire was completed by the patient on (not done); I have confirmed that all information answered by patient is correct and no changes since this date. Cardiac Risk Factors include: advanced age (>57men, >13 women);dyslipidemia;obesity (BMI >30kg/m2);sedentary lifestyle;hypertension    Objective:    Today's Vitals   08/07/23 1429  Weight: 182 lb (82.6 kg)  Height: 5\' 1"  (1.549 m)   Body mass index is 34.39 kg/m.     08/07/2023    2:43 PM 06/28/2022   10:48 AM 03/14/2022   11:24 AM 10/23/2018   12:37 PM 10/18/2017   11:56 AM 10/17/2016    2:53 PM  Advanced Directives  Does Patient Have a Medical Advance Directive? Yes Yes Yes Yes Yes Yes  Type of Estate agent of [place];Living will Healthcare Power of [place];Living will Healthcare Power of [place];Living will Healthcare Power of [place];Living will Healthcare Power of [place];Living will Healthcare Power of [place];Living will  Does patient want to make changes to medical advance directive?   No - Patient declined     Copy of Healthcare Power of Attorney in Chart? No - copy requested No - copy requested No - copy requested  No - copy requested No  - copy requested  Would patient like information on creating a medical advance directive?   No - Patient declined       Current Medications (verified) Outpatient Encounter Medications as of 08/07/2023  Medication Sig   acetaminophen (TYLENOL) 325 MG tablet Take by mouth.   albuterol (VENTOLIN HFA) 108 (90 Base) MCG/ACT inhaler TAKE 2 PUFFS BY MOUTH EVERY 6 HOURS AS NEEDED FOR WHEEZE OR SHORTNESS OF BREATH   allopurinol (ZYLOPRIM) 300 MG tablet TAKE 1 TABLET BY MOUTH EVERY DAY   amLODipine (NORVASC) 10 MG tablet TAKE 1 TABLET BY MOUTH EVERY DAY   apixaban (ELIQUIS) 5 MG TABS tablet Take 1 tablet (5 mg total) by mouth 2 (two) times daily.   fluticasone (FLONASE) 50 MCG/ACT nasal spray SPRAY 2 SPRAYS INTO EACH NOSTRIL EVERY DAY   fluticasone furoate-vilanterol (BREO ELLIPTA) 200-25 MCG/ACT AEPB Inhale 1 puff into the lungs daily.   magnesium oxide (MAG-OX) 400 (240 Mg) MG tablet TAKE 1 TABLET (400 MG TOTAL) BY MOUTH IN THE MORNING AND AT BEDTIME.   Multiple Vitamin (MULTIVITAMIN) tablet Take 2 tablets by mouth daily.    simvastatin (ZOCOR) 20 MG tablet Take 1 tablet (20 mg total) by mouth at bedtime.   telmisartan-hydrochlorothiazide (MICARDIS HCT) 80-25 MG tablet Take 1 tablet by mouth daily.   No facility-administered encounter medications on file as of 08/07/2023.    Allergies (verified) Alendronate, Lipitor [atorvastatin], Naproxen sodium, Bioflavonoids, Iodinated contrast media, and Naproxen   History: Past Medical History:  Diagnosis Date   Allergic rhinitis    CHOLELITHIASIS, HX OF 11/17/2010   Qualifier: Diagnosis  of  By: Milinda Antis MD, Colon Flattery    Dysplastic nevus 02/06/2013   R flank - moderate   Hyperglycemia    Hyperlipidemia    Hypertension    Melanoma (HCC) 08/17/2012   back of right arm   Mixed incontinence    Obesity    Plantar fasciitis    with significant disability   Tendonitis of foot    L, chronic foot pain   Past Surgical History:  Procedure Laterality Date    ABDOMINAL HYSTERECTOMY     fibroids, ovaries intact   BREAST EXCISIONAL BIOPSY Bilateral 1960's -1970's   benign   E[facility] SURGERY [facility] 12/2015   [facility]   TENDON REPAIR  5/07   Tendon rupture L foot   Family History  Problem Relation Age of Onset   Heart attack Mother    Other Mother        ?thyroid problems   Lung cancer Father    Social History   Socioeconomic History   Marital status: Married    Spouse name: Not on file   Number of children: Not on file   Years of education: Not on file   Highest education level: Not on file  Occupational History   Not on file  Tobacco Use   Smoking status: Never   Smokeless tobacco: Never  Vaping Use   Vaping status: Never Used  Substance and Sexual Activity   Alcohol use: No    Alcohol/week: 0.0 standard drinks of alcohol   Drug use: No   Sexual activity: Not on file  Other Topics Concern   Not on file  Social History Narrative   Cannot exercise to chronic foot pain   Social Determinants of Health   Financial Resource Strain: Low Risk  (08/07/2023)   Overall Financial Resource Strain (CARDIA)    Difficulty of Paying Living Expenses: Not hard at all  Food Insecurity: No Food Insecurity (08/07/2023)   Hunger Vital Sign    Worried About Running Out of Food in the Last Year: Never true    Ran Out of Food in the Last Year: Never true  Transportation Needs: No Transportation Needs (08/07/2023)   PRAPARE - Administrator, Civil Service (Medical): No    Lack of Transportation (Non-Medical): No  Physical Activity: Inactive (08/07/2023)   Exercise Vital Sign    Days of Exercise per Week: 0 days    Minutes of Exercise per Session: 0 min  Stress: No Stress Concern Present (08/07/2023)   [employer] of Occupational Health - Occupational Stress Questionnaire    Feeling of Stress : Not at all  Social Connections: Moderately Integrated (08/07/2023)   Social Connection and Isolation Panel [NHANES]    Frequency of  Communication with Friends and Family: More than three times a week    Frequency of Social Gatherings with Friends and Family: More than three times a week    Attends Religious Services: More than 4 times per year    Active Member of [employer] or Organizations: No    Attends Engineer, structural: Never    Marital Status: Married    Tobacco Counseling Counseling given: Not Answered   Clinical Intake:  Pre-visit preparation completed: No  Pain : No/denies pain   BMI - recorded: 34.39 Nutritional Status: BMI > 30  Obese Nutritional Risks: None Diabetes: No  How often do you need to have someone help you when you read instructions, pamphlets, or other written materials from your doctor or pharmacy?:  1 - Never  Interpreter Needed?: No  Comments: lives with husband Information entered by :: B.Kelly Ranieri,LPN   Activities of Daily Living    08/07/2023    2:43 PM  In your present state of health, do you have any difficulty performing the following activities:  Hearing? 1  Vision? 0  Difficulty concentrating or making decisions? 0  Walking or climbing stairs? 0  Dressing or bathing? 0  Doing errands, shopping? 0  Preparing Food and eating ? N  Using the Toilet? N  In the past six months, have you accidently leaked urine? Y  Do you have problems with loss of bowel control? N  Managing your Medications? N  Managing your Finances? N  Housekeeping or managing your Housekeeping? N    Patient Care Team: Tower, Audrie Gallus, MD as PCP - General Imagene Riches, MD as Referring Physician (Ophthalmology) Lucretia Roers, MD as Referring Physician (General Surgery) Willeen Niece, MD as Consulting Physician (Dermatology) Shane Crutch, MD as Consulting Physician (Pulmonary Disease)  Indicate any recent Medical Services you may have received from other than [facility] providers in the past year (date may be approximate).     Assessment:   This is a routine wellness examination for  Karysa.  Hearing/Vision screen Hearing Screening - Comments:: Pt says she cannot hear out of rt ear;left ear is good Vision Screening - Comments:: Pt says she sees good with glasses [facility]   Goals Addressed             This Visit's Progress    COMPLETED: Follow up with Primary Care Provider   On track    Starting 10/23/2018, I will continue to take medications as prescribed and to keep appointments with PCP as scheduled.      COMPLETED: Patient Stated   On track    06/28/2022, wants to get rid of wheelchair and wants to get stronger       Depression Screen    08/07/2023    2:38 PM 07/18/2023    9:37 AM 07/12/2023   10:41 AM 06/28/2023   10:35 AM 06/28/2022   10:50 AM 03/02/2021   11:00 AM 05/12/2020   10:36 AM  PHQ 2/9 Scores  PHQ - 2 Score 1 1 1 2  0 0 0  PHQ- 9 Score  7 6 9        Fall Risk    08/07/2023    2:32 PM 07/18/2023    9:37 AM 06/28/2023   10:34 AM 10/20/2022   10:34 AM 06/28/2022   10:49 AM  Fall Risk   Falls in the past year? 0 0 0 1 1  Comment     legs gave out  Number falls in past yr: 0 0 0 0 0  Injury with Fall? 0 0 0 1 0  Risk for fall due to : No Fall Risks No Fall Risks No Fall Risks History of fall(s) Medication side effect;Impaired mobility;Impaired balance/gait  Follow up Falls prevention discussed;Education provided Falls evaluation completed  Falls evaluation completed Falls evaluation completed;Education provided;Falls prevention discussed    MEDICARE RISK AT HOME: Medicare Risk at Home Any stairs in or around the home?: Yes If so, are there any without handrails?: Yes Home free of loose throw rugs in walkways, pet beds, electrical cords, etc?: Yes Adequate lighting in your home to reduce risk of falls?: Yes Life alert?: No Use of a cane, walker or w/c?: Yes (cane when outside the house) Grab bars in the bathroom?: Yes Shower  chair or bench in shower?: Yes Elevated toilet seat or a handicapped toilet?: Yes  TIMED UP AND  GO:  Was the test performed?  No    Cognitive Function:    10/23/2018    2:52 PM 10/18/2017   11:22 AM 10/17/2016    3:00 PM  MMSE - Mini Mental State Exam  Orientation to time 5 5 5   Orientation to Place 5 5 5   Registration 3 3 3   Attention/ Calculation 0 0 0  Recall 3 3 3   Language- name 2 objects 0 0 0  Language- repeat 1 1 1   Language- follow 3 step command 3 3 3   Language- read & follow direction 0 0 0  Write a sentence 0 0 0  Copy design 0 0 0  Total score 20 20 20         08/07/2023    2:44 PM 06/28/2022   10:53 AM  6CIT Screen  What Year? 0 points 0 points  What month? 0 points 0 points  What time? 0 points 0 points  Count back from 20 0 points 0 points  Months in reverse 0 points 0 points  Repeat phrase 0 points 2 points  Total Score 0 points 2 points    Immunizations Immunization History  Administered Date(s) Administered   Fluad Quad(high Dose 65+) 05/12/2020   Influenza,inj,Quad PF,6+ Mos 10/15/2014, 10/19/2015, 08/09/2016, 08/17/2017, 10/04/2018   PFIZER(Purple Top)SARS-COV-2 Vaccination 11/03/2019, 11/26/2019   Pneumococcal Conjugate-13 10/15/2014   Pneumococcal Polysaccharide-23 03/22/2010   Td 01/13/1998, 01/30/2008   Zoster, Live 02/05/2008    TDAP status: Up to date  Flu Vaccine status: Declined, Education has been provided regarding the importance of this vaccine but patient still declined. Advised may receive this vaccine at local pharmacy or Health Dept. Aware to provide a copy of the vaccination record if obtained from local pharmacy or Health Dept. Verbalized acceptance and understanding.  Pneumococcal vaccine status: Up to date  Covid-19 vaccine status: Completed vaccines  Qualifies for Shingles Vaccine? Yes   Zostavax completed No   Shingrix Completed?: No.    Education has been provided regarding the importance of this vaccine. Patient has been advised to call insurance company to determine out of pocket expense if they have not yet  received this vaccine. Advised may also receive vaccine at local pharmacy or Health Dept. Verbalized acceptance and understanding.  Screening Tests Health Maintenance  Topic Date Due   Zoster Vaccines- Shingrix (1 of 2) 10/12/2023 (Originally 08/18/1965)   INFLUENZA VACCINE  12/04/2023 (Originally 04/06/2023)   DTaP/Tdap/Td (3 - Tdap) 07/11/2024 (Originally 01/29/2018)   COVID-19 Vaccine (3 - Pfizer risk series) 08/02/2024 (Originally 12/24/2019)   MAMMOGRAM  09/27/2023   Medicare Annual Wellness (AWV)  08/06/2024   Pneumonia Vaccine 50+ Years old  Completed   DEXA SCAN  Completed   Hepatitis C Screening  Completed   HPV VACCINES  Aged Out   Colonoscopy  Discontinued    Health Maintenance  There are no preventive care reminders to display for this patient.   Colorectal cancer screening: No longer required.   Mammogram status: No longer required due to age.  Bone Density status: Completed 03/20/23. Results reflect: Bone density results: OSTEOPENIA. Repeat every 3-5 years.  Lung Cancer Screening: (Low Dose CT Chest recommended if Age 12-80 years, 20 pack-year currently smoking OR have quit w/in 15years.) does not qualify.   Lung Cancer Screening Referral: no  Additional Screening:  Hepatitis C Screening: does not qualify; Completed 2/12/20218  Vision Screening:  Recommended annual ophthalmology exams for early detection of glaucoma and other disorders of the eye. Is the patient up to date with their annual eye exam?  Yes  Who is the provider or what is the name of the office in which the patient attends annual eye exams? [facility] If pt is not established with a provider, would they like to be referred to a provider to establish care? No .   Dental Screening: Recommended annual dental exams for proper oral hygiene  Diabetic Foot Exam: n/a  Community Resource Referral / Chronic Care Management: CRR required this visit?  No   CCM required this visit?  No    Plan:      I have personally reviewed and noted the following in the patient's chart:   Medical and social history Use of alcohol, tobacco or illicit drugs  Current medications and supplements including opioid prescriptions. Patient is not currently taking opioid prescriptions. Functional ability and status Nutritional status Physical activity Advanced directives List of other physicians Hospitalizations, surgeries, and ER visits in previous 12 months Vitals Screenings to include cognitive, depression, and falls Referrals and appointments  In addition, I have reviewed and discussed with patient certain preventive protocols, quality metrics, and best practice recommendations. A written personalized care plan for preventive services as well as general preventive health recommendations were provided to patient.    Sue Lush, LPN   [id number]   After Visit Summary: (MyChart) Due to this being a telephonic visit, the after visit summary with patients personalized plan was offered to patient via MyChart   Nurse Notes: The patient states she is doing well and has no concerns or questions at this time.

## 2023-08-17 DIAGNOSIS — C439 Malignant melanoma of skin, unspecified: Secondary | ICD-10-CM | POA: Diagnosis not present

## 2023-08-17 DIAGNOSIS — C7802 Secondary malignant neoplasm of left lung: Secondary | ICD-10-CM | POA: Diagnosis not present

## 2023-08-29 DIAGNOSIS — H90A21 Sensorineural hearing loss, unilateral, right ear, with restricted hearing on the contralateral side: Secondary | ICD-10-CM | POA: Diagnosis not present

## 2023-08-31 ENCOUNTER — Other Ambulatory Visit: Payer: Self-pay | Admitting: Family Medicine

## 2023-09-01 DIAGNOSIS — C7800 Secondary malignant neoplasm of unspecified lung: Secondary | ICD-10-CM | POA: Diagnosis not present

## 2023-09-01 DIAGNOSIS — C439 Malignant melanoma of skin, unspecified: Secondary | ICD-10-CM | POA: Diagnosis not present

## 2023-09-01 DIAGNOSIS — C7802 Secondary malignant neoplasm of left lung: Secondary | ICD-10-CM | POA: Diagnosis not present

## 2023-09-01 DIAGNOSIS — K862 Cyst of pancreas: Secondary | ICD-10-CM | POA: Diagnosis not present

## 2023-09-03 ENCOUNTER — Other Ambulatory Visit: Payer: Self-pay | Admitting: Family Medicine

## 2023-09-05 ENCOUNTER — Other Ambulatory Visit: Payer: Self-pay | Admitting: Family Medicine

## 2023-09-07 DIAGNOSIS — C7802 Secondary malignant neoplasm of left lung: Secondary | ICD-10-CM | POA: Diagnosis not present

## 2023-09-12 DIAGNOSIS — C7802 Secondary malignant neoplasm of left lung: Secondary | ICD-10-CM | POA: Diagnosis not present

## 2023-09-13 DIAGNOSIS — H903 Sensorineural hearing loss, bilateral: Secondary | ICD-10-CM | POA: Diagnosis not present

## 2023-09-19 DIAGNOSIS — C7802 Secondary malignant neoplasm of left lung: Secondary | ICD-10-CM | POA: Diagnosis not present

## 2023-09-21 DIAGNOSIS — C7802 Secondary malignant neoplasm of left lung: Secondary | ICD-10-CM | POA: Diagnosis not present

## 2023-09-26 DIAGNOSIS — C7802 Secondary malignant neoplasm of left lung: Secondary | ICD-10-CM | POA: Diagnosis not present

## 2023-10-03 ENCOUNTER — Other Ambulatory Visit: Payer: Self-pay | Admitting: Family Medicine

## 2023-10-15 ENCOUNTER — Telehealth: Payer: Self-pay | Admitting: Family Medicine

## 2023-10-15 DIAGNOSIS — E7800 Pure hypercholesterolemia, unspecified: Secondary | ICD-10-CM

## 2023-10-15 DIAGNOSIS — E038 Other specified hypothyroidism: Secondary | ICD-10-CM

## 2023-10-15 DIAGNOSIS — R7303 Prediabetes: Secondary | ICD-10-CM

## 2023-10-15 DIAGNOSIS — I10 Essential (primary) hypertension: Secondary | ICD-10-CM

## 2023-10-15 NOTE — Telephone Encounter (Signed)
-----   Message from Darcella Earnest sent at 09/28/2023 10:36 AM EST ----- Regarding: Labs for Monday 2.10.25 Please put physical lab orders in future. Thank you, Andrea Peters

## 2023-10-16 ENCOUNTER — Other Ambulatory Visit: Payer: Medicare Other

## 2023-10-18 ENCOUNTER — Ambulatory Visit: Payer: Self-pay | Admitting: Family Medicine

## 2023-10-18 NOTE — Telephone Encounter (Signed)
Information obtained from son.  Chief Complaint: lethargic, dark stools Symptoms: lethargy, black tarry stools Frequency: 2 days ago Pertinent Negatives: Patient denies fever, denies ABD pain, denies use of pepto, denies hx of gi bleed  Disposition: [] ED /[] Urgent Care (no appt availability in office) / [x] Appointment(In office/virtual)/ []  [facility]/ [] Home Care/ [] Refused Recommended Disposition /[] [facility]/ []  Follow-up with PCP  Additional Notes: Pt has been taking pearls for her cough. Pt had dark sticky stools for 2 days, today stools have been back to normal. Pt takes blood thinners. Pt sched with pcp tomorrow morning. Pt states that she had the black tarry stool for a couple days and that has since resolved. Pt states that she also had a stool with bloody mucus at one point. Pt states that this has all resolved.   Copied from CRM [id number]. Topic: Clinical - Red Word Triage >> Oct 18, 2023  9:51 AM Gurney Maxin H wrote: [facility] that prompted transfer to Nurse Triage: Patients son states patients son states patient has been lethargic, stomach issues and black stools for 2 days. Today not as dark. Reason for Disposition . Taking Coumadin (warfarin) or other strong blood thinner, or known bleeding disorder (e.g., thrombocytopenia)  Answer Assessment - Initial Assessment Questions 1. APPEARANCE of BLOOD: "What color is it?" "Is it passed separately, on the surface of the stool, or mixed in with the stool?"      One time I thought I did 2. AMOUNT: "How much blood was passed?"      unsure 3. FREQUENCY: "How many times has blood been passed with the stools?"      once 4. ONSET: "When was the blood first seen in the stools?" (Days or weeks)      Several days ago 5. DIARRHEA: "Is there also some diarrhea?" If Yes, ask: "How many diarrhea stools in the past 24 hours?"      denies 6. CONSTIPATION: "Do you have constipation?" If Yes, ask: "How bad is it?"      denies 7. RECURRENT SYMPTOMS: "Have you had blood in your stools before?" If Yes, ask: "When was the last time?" and "What happened that time?"      denies 8. BLOOD THINNERS: "Do you take any blood thinners?" (e.g., Coumadin/warfarin, Pradaxa/dabigatran, aspirin)     thinner 9. OTHER SYMPTOMS: "Do you have any other symptoms?"  (e.g., abdomen pain, vomiting, dizziness, fever)     dizziness  Answer Assessment - Initial Assessment Questions 1. COLOR: "What color is it?" "Is that color in part or all of the stool?"     Dark 2. ONSET: "When was the unusual color first noted?"     2 days ago 3. CAUSE: "Have you eaten any food or taken any medicine of this color?" Note: See listing in Background Information section.      Denies states that the only new medication is the tessalon pearls.  4. OTHER SYMPTOMS: "Do you have any other symptoms?" (e.g., abdomen pain, diarrhea, jaundice, fever).     denies  Protocols used: Stools - Unusual Color-A-AH, Rectal Bleeding-A-AH

## 2023-10-18 NOTE — Telephone Encounter (Signed)
I will see her them  Agree with ER precautions especially if dark stools or blood in stool return

## 2023-10-19 ENCOUNTER — Ambulatory Visit: Payer: Medicare Other | Admitting: Family Medicine

## 2023-10-19 ENCOUNTER — Ambulatory Visit (INDEPENDENT_AMBULATORY_CARE_PROVIDER_SITE_OTHER)
Admission: RE | Admit: 2023-10-19 | Discharge: 2023-10-19 | Disposition: A | Discharge status: Home / Self Care | Payer: Medicare Other | Source: Ambulatory Visit | Attending: Family Medicine | Admitting: Family Medicine

## 2023-10-19 ENCOUNTER — Encounter: Payer: Self-pay | Admitting: Family Medicine

## 2023-10-19 ENCOUNTER — Other Ambulatory Visit: Payer: Medicare Other

## 2023-10-19 VITALS — BP 134/72 | HR 71 | Temp 97.7°F | Ht 61.0 in | Wt 181.4 lb

## 2023-10-19 DIAGNOSIS — C4360 Malignant melanoma of unspecified upper limb, including shoulder: Secondary | ICD-10-CM

## 2023-10-19 DIAGNOSIS — R195 Other fecal abnormalities: Secondary | ICD-10-CM

## 2023-10-19 DIAGNOSIS — R051 Acute cough: Secondary | ICD-10-CM | POA: Diagnosis not present

## 2023-10-19 DIAGNOSIS — R059 Cough, unspecified: Secondary | ICD-10-CM | POA: Diagnosis not present

## 2023-10-19 DIAGNOSIS — R300 Dysuria: Secondary | ICD-10-CM | POA: Diagnosis not present

## 2023-10-19 DIAGNOSIS — J111 Influenza due to unidentified influenza virus with other respiratory manifestations: Secondary | ICD-10-CM | POA: Diagnosis not present

## 2023-10-19 DIAGNOSIS — D649 Anemia, unspecified: Secondary | ICD-10-CM | POA: Diagnosis not present

## 2023-10-19 DIAGNOSIS — R1013 Epigastric pain: Secondary | ICD-10-CM | POA: Insufficient documentation

## 2023-10-19 LAB — POC URINALSYSI DIPSTICK (AUTOMATED)
Bilirubin, UA: 1
Blood, UA: NEGATIVE
Glucose, UA: NEGATIVE
Ketones, UA: 5
Nitrite, UA: NEGATIVE
Protein, UA: POSITIVE — AB
Spec Grav, UA: >=1.03 — AB (ref 1.010–1.025)
Urobilinogen, UA: 0.2 U/dL
pH, UA: 6 (ref 5.0–8.0)

## 2023-10-19 LAB — FERRITIN: Ferritin: 28.5 ng/mL (ref 10.0–291.0)

## 2023-10-19 LAB — IRON: Iron: 85 ug/dL (ref 42–145)

## 2023-10-19 MED ORDER — FAMOTIDINE 20 MG PO TABS: 20.0000 mg | ORAL_TABLET | Freq: Two times a day (BID) | ORAL | 1 refills | Status: DC

## 2023-10-19 MED ORDER — BENZONATATE 200 MG PO CAPS: 200.0000 mg | ORAL_CAPSULE | Freq: Three times a day (TID) | ORAL | 2 refills | Status: DC | PRN

## 2023-10-19 NOTE — Assessment & Plan Note (Signed)
May be due to stress (cancer rad treatment ) and also not eating regularly   Famotidine 20 mg bid sent to pharmacy  Encouraged to update with response   Also had dark stool in setting of eliquis  Neg heme today Reassuring  Cbc pending

## 2023-10-19 NOTE — Assessment & Plan Note (Signed)
With progression in left lower lung- just had rad treatment for  Low appetite/po intake noted  Very tired and has been through a lot Reviewed last onc note

## 2023-10-19 NOTE — Progress Notes (Signed)
Subjective:    Patient ID: Andrea Peters, female    DOB: 01-Aug-1946, 78 y.o.   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  Recent flu. EXAM: CHEST - 2 VIEW COMPARISON:  Chest x-ray dated March 22, 2023. FINDINGS: The heart size and mediastinal contours are within normal limits. Both lungs are clear. No acute osseous abnormality. Chronic L1 compression deformity again noted. IMPRESSION: 1. No acute cardiopulmonary disease. Electronically Signed   By: Obie Dredge M.D.   On: 10/19/2023 09:30      Results for orders placed or performed in visit on 10/19/23  POCT Urinalysis Dipstick (Automated)   Collection Time: 10/19/23  8:21 AM  Result Value Ref Range   Color, UA Dark Yellow    Clarity, UA Clear    Glucose, UA Negative Negative   Bilirubin, UA 1 mg/dL    Ketones, UA 5 mg/dL    Spec Grav, UA >=4.098 (A) 1.010 - 1.025   Blood, UA Negative    pH, UA 6.0 5.0 - 8.0   Protein, UA Positive (A) Negative    Urobilinogen, UA 0.2 0.2 or 1.0 E.U./dL   Nitrite, UA Negative    Leukocytes, UA Trace (A) Negative  Ferritin   Collection Time: 10/19/23  8:50 AM  Result Value Ref Range   Ferritin 28.5 10.0 - 291.0 ng/mL      Dark stool started last week  Not a lot of volume (not eating much)  Urgent stools when she does eat  Loose  Very dark  One time a little bright blood /mucous  No recent antibiotic Belly pain- only cramping with bms   Feels like insides are off  Feels hunger pangs but cannot make herself     Patient Active Problem List   Diagnosis Date Noted   Dark stools 10/19/2023   Anemia 10/19/2023   Dyspepsia 10/19/2023   Caregiver stress 06/28/2023   Persistent cough for 3 weeks or longer 03/22/2023   Mild persistent allergic asthma with acute exacerbation 03/22/2023   Knee pain 10/20/2022   Bursitis of left elbow 10/20/2022   Heart murmur 10/20/2022   Dysuria 05/05/2022   L1 vertebral fracture (HCC) 03/15/2022   Cancer associated pain 03/15/2022   Hypomagnesemia 03/09/2022   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   Subclinical hypothyroidism 03/02/2021   Serum calcium elevated  11/05/2019   Prediabetes 10/26/2018   Cough 03/29/2016   Fatigue 03/29/2016   Osteopenia 10/19/2015   Estrogen deficiency 10/15/2014   Melanoma of upper arm (HCC) 05/08/2013   Gout 11/17/2010   HYPERCHOLESTEROLEMIA, PURE 05/17/2007   Allergic rhinitis 01/23/2007   Essential hypertension 12/20/2006   Past Medical History:  Diagnosis Date   Allergic rhinitis    CHOLELITHIASIS, HX OF 11/17/2010   Qualifier: Diagnosis of  By: Milinda Antis MD, Colon Flattery    Dysplastic nevus 02/06/2013   R flank - moderate   Hyperglycemia    Hyperlipidemia    Hypertension    Melanoma (HCC) 08/17/2012   back of right arm   Mixed incontinence    Obesity    Plantar fasciitis    with significant disability   Tendonitis of foot    L, chronic foot pain   Past Surgical History:  Procedure Laterality Date   ABDOMINAL  HYSTERECTOMY     fibroids, ovaries intact   BREAST EXCISIONAL BIOPSY Bilateral 1960's -1970's   benign   EYE SURGERY Bilateral 12/2015   at [facility]   TENDON REPAIR  5/07   Tendon rupture L foot   Social History   Tobacco Use   Smoking status: Never   Smokeless tobacco: Never  Vaping Use   Vaping status: Never Used  Substance Use Topics   Alcohol use: No    Alcohol/week: 0.0 standard drinks of alcohol   Drug use: No   Family History  Problem Relation Age of Onset   Heart attack Mother    Other Mother        ?thyroid problems   Lung cancer Father    Allergies  Allergen Reactions   Alendronate     Hair loss  Nausea Diarrhea    Lipitor [Atorvastatin]     Muscle pain   Naproxen Sodium    Bioflavonoids Rash   Iodinated Contrast Media Rash and Swelling    On the evening after CT scan, mild rash noted on bilateral lower legs stopping at ankles, not itchy, along with mild swelling of bilateral lower legs/ankles/feet   Naproxen Other (See Comments) and Rash    Severe indigestion   Current Outpatient Medications on File Prior to Visit  Medication Sig Dispense Refill   acetaminophen (TYLENOL) 325 MG tablet Take by mouth.     albuterol (VENTOLIN HFA) 108 (90 Base) MCG/ACT inhaler TAKE 2 PUFFS BY MOUTH EVERY 6 HOURS AS NEEDED FOR WHEEZE OR SHORTNESS OF BREATH 18 each 2   allopurinol (ZYLOPRIM) 300 MG tablet TAKE 1 TABLET BY MOUTH EVERY DAY 90 tablet 2   amLODipine (NORVASC) 10 MG tablet TAKE 1 TABLET BY MOUTH EVERY DAY 90 tablet 0   apixaban (ELIQUIS) 5 MG TABS tablet Take 1 tablet (5 mg total) by mouth 2 (two) times daily. 180 tablet 1   fluticasone (FLONASE) 50 MCG/ACT nasal spray SPRAY 2 SPRAYS INTO EACH NOSTRIL EVERY DAY 48 mL 0   fluticasone furoate-vilanterol (BREO ELLIPTA) 200-25 MCG/ACT AEPB USE 1 INHALATION ORALLY    DAILY 90 each 0   magnesium oxide (MAG-OX) 400 (240 Mg) MG tablet TAKE 1 TABLET (400 MG TOTAL) BY MOUTH IN THE MORNING AND AT BEDTIME. 180 tablet 1   Multiple  Vitamin (MULTIVITAMIN) tablet Take 2 tablets by mouth daily.      simvastatin (ZOCOR) 20 MG tablet TAKE 1 TABLET BY MOUTH EVERYDAY AT BEDTIME 90 tablet 0   telmisartan-hydrochlorothiazide (MICARDIS HCT) 80-25 MG tablet TAKE 1 TABLET BY MOUTH EVERY DAY  90 tablet 0   No current facility-administered medications on file prior to visit.    Review of Systems  Constitutional:  Positive for activity change, appetite change and fatigue. Negative for fever and unexpected weight change.  HENT:  Positive for congestion. Negative for ear pain, rhinorrhea, sinus pressure and sore throat.   Eyes:  Negative for pain, redness and visual disturbance.  Respiratory:  Positive for cough and shortness of breath. Negative for wheezing.   Cardiovascular:  Negative for chest pain and palpitations.  Gastrointestinal:  Negative for abdominal pain, blood in stool, constipation, diarrhea, nausea, rectal pain and vomiting.       Discomfort in upper abdomen from hunger   Stools were dark Now back to normal  Endocrine: Negative for polydipsia and polyuria.  Genitourinary:  Positive for dysuria. Negative for frequency, hematuria and urgency.  Musculoskeletal:  Negative for arthralgias, back pain and myalgias.  Skin:  Negative for pallor and rash.  Allergic/Immunologic: Negative for environmental allergies.  Neurological:  Negative for dizziness, syncope and headaches.  Hematological:  Negative for adenopathy. Does not bruise/bleed easily.  Psychiatric/Behavioral:  Negative for decreased concentration and dysphoric mood. The patient is not nervous/anxious.        Objective:   Physical Exam Exam conducted with a chaperone present.  Constitutional:      General: She is not in acute distress.    Appearance: Normal appearance. She is well-developed. She is obese. She is not ill-appearing or diaphoretic.     Comments: Fatigued and frail appearing today  HENT:     Head: Normocephalic and atraumatic.     Nose:  Congestion present.     Mouth/Throat:     Pharynx: Oropharynx is clear. No posterior oropharyngeal erythema.  Eyes:     General:        Right eye: No discharge.        Left eye: No discharge.     Conjunctiva/sclera: Conjunctivae normal.     Pupils: Pupils are equal, round, and reactive to light.  Neck:     Thyroid: No thyromegaly.     Vascular: No carotid bruit or JVD.  Cardiovascular:     Rate and Rhythm: Normal rate and regular rhythm.     Heart sounds: Normal heart sounds.     No gallop.  Pulmonary:     Effort: Pulmonary effort is normal. No respiratory distress.     Breath sounds: Normal breath sounds. No stridor. No wheezing, rhonchi or rales.     Comments: Harsh bs Upper airway sounds  No rales or wheezes or rhonchi  Abdominal:     General: Abdomen is protuberant. Bowel sounds are normal. There is no distension or abdominal bruit.     Palpations: Abdomen is soft. There is no fluid wave or hepatomegaly.     Tenderness: There is abdominal tenderness in the epigastric area and left upper quadrant. There is no right CVA tenderness, left CVA tenderness, guarding or rebound. Negative signs include Murphy's sign.  Genitourinary:    Exam position: Knee-chest position.     Rectum: Normal. Guaiac result negative. No mass, tenderness, external hemorrhoid or internal hemorrhoid. Normal anal tone.  Musculoskeletal:     Cervical back: Normal range of motion and neck supple.     Right lower leg: No edema.     Left lower leg: No edema.  Lymphadenopathy:     Cervical: No cervical adenopathy.  Skin:    General: Skin is warm and dry.     Coloration:  Skin is not pale.     Findings: No rash.  Neurological:     Mental Status: She is alert.     Coordination: Coordination normal.     Deep Tendon Reflexes: Reflexes are normal and symmetric. Reflexes normal.  Psychiatric:        Mood and Affect: Mood normal.           Assessment & Plan:   Problem List Items Addressed This Visit        Other   Melanoma of upper arm (HCC)   With progression in left lower lung- just had rad treatment for  Low appetite/po intake noted  Very tired and has been through a lot Reviewed last onc note        Dysuria   Urinalysis today noted concentration , some ketones and bili and trace of leuk  Unsure if uti  Culture pending  Strongly enc to drink more fluids - if more dilute/ may help symptoms   Will treat for uti if positive  Instructed to call if symptoms worsen in meantime       Relevant Orders   POCT Urinalysis Dipstick (Automated) (Completed)   Urine Culture   Dyspepsia   May be due to stress (cancer rad treatment ) and also not eating regularly   Famotidine 20 mg bid sent to pharmacy  Encouraged to update with response   Also had dark stool in setting of eliquis  Neg heme today Reassuring  Cbc pending       Dark stools - Primary   Pt takes eliquis for history of blood clot with cancer  Had dark stools for several days -not resolved Heme neg stool today Some dyspepsia in setting of poor oral intake/reduced appetite with recent cancer radiation treatment   Reassuring exam Sent in famotidine 20 mg bid  Cbc and iron studies today      Relevant Orders   Iron   Ferritin (Completed)   Cough   S/p influenza Reassuring exam /good air exch Pulse ox 95% on RA Cxr ordered (reassuring/no infiltrate)   Recommend symptomatic care and monitor closely Tessalon sent for cough which helped in past        Relevant Orders   DG Chest 2 View (Completed)   Anemia   Cbc today along with iron studies for reported recent dark stool (heme neg today) in setting of taking eliqus and with cancer          Relevant Orders   Iron   Ferritin (Completed)

## 2023-10-19 NOTE — Assessment & Plan Note (Signed)
Urinalysis today noted concentration , some ketones and bili and trace of leuk  Unsure if uti  Culture pending  Strongly enc to drink more fluids - if more dilute/ may help symptoms   Will treat for uti if positive  Instructed to call if symptoms worsen in meantime

## 2023-10-19 NOTE — Patient Instructions (Addendum)
Push fluids the best you can to make urine more dilute This may help the burning  We will also culture urine    Labs today for anemia  Stool is negative for blood today   If the black stool returns return let us kow    Let's get a chest xray for cough   Let's do labs today for your upcoming annual health maintenance visit and also to check for anemia    Start some generic pepcid twice daily

## 2023-10-19 NOTE — Assessment & Plan Note (Signed)
Cbc today along with iron studies for reported recent dark stool (heme neg today) in setting of taking eliqus and with cancer

## 2023-10-19 NOTE — Assessment & Plan Note (Signed)
Pt takes eliquis for history of blood clot with cancer  Had dark stools for several days -not resolved Heme neg stool today Some dyspepsia in setting of poor oral intake/reduced appetite with recent cancer radiation treatment   Reassuring exam Sent in famotidine 20 mg bid  Cbc and iron studies today

## 2023-10-19 NOTE — Assessment & Plan Note (Addendum)
S/p influenza Reassuring exam /good air exch Pulse ox 95% on RA Cxr ordered (reassuring/no infiltrate)   Recommend symptomatic care and monitor closely Tessalon sent for cough which helped in past

## 2023-10-20 LAB — URINE CULTURE
MICRO NUMBER:: 16080887
SPECIMEN QUALITY:: ADEQUATE

## 2023-10-23 ENCOUNTER — Encounter: Payer: Medicare Other | Admitting: Family Medicine

## 2023-11-10 ENCOUNTER — Other Ambulatory Visit: Payer: Self-pay | Admitting: Family Medicine

## 2023-11-13 ENCOUNTER — Other Ambulatory Visit (INDEPENDENT_AMBULATORY_CARE_PROVIDER_SITE_OTHER): Payer: Medicare Other

## 2023-11-13 DIAGNOSIS — I10 Essential (primary) hypertension: Secondary | ICD-10-CM | POA: Diagnosis not present

## 2023-11-13 DIAGNOSIS — E7800 Pure hypercholesterolemia, unspecified: Secondary | ICD-10-CM

## 2023-11-13 DIAGNOSIS — E038 Other specified hypothyroidism: Secondary | ICD-10-CM | POA: Diagnosis not present

## 2023-11-13 DIAGNOSIS — R7303 Prediabetes: Secondary | ICD-10-CM

## 2023-11-13 LAB — CBC WITH DIFFERENTIAL/PLATELET
Basophils Absolute: 0.1 10*3/uL (ref 0.0–0.1)
Basophils Relative: 0.9 % (ref 0.0–3.0)
Eosinophils Absolute: 0.2 10*3/uL (ref 0.0–0.7)
Eosinophils Relative: 3.8 % (ref 0.0–5.0)
HCT: 38.5 % (ref 36.0–46.0)
Hemoglobin: 12.5 g/dL (ref 12.0–15.0)
Lymphocytes Relative: 28.1 % (ref 12.0–46.0)
Lymphs Abs: 1.6 10*3/uL (ref 0.7–4.0)
MCHC: 32.5 g/dL (ref 30.0–36.0)
MCV: 98.9 fl (ref 78.0–100.0)
Monocytes Absolute: 0.6 10*3/uL (ref 0.1–1.0)
Monocytes Relative: 10.2 % (ref 3.0–12.0)
Neutro Abs: 3.3 10*3/uL (ref 1.4–7.7)
Neutrophils Relative %: 57 % (ref 43.0–77.0)
Platelets: 291 10*3/uL (ref 150.0–400.0)
RBC: 3.89 Mil/uL (ref 3.87–5.11)
RDW: 15.4 % (ref 11.5–15.5)
WBC: 5.8 10*3/uL (ref 4.0–10.5)

## 2023-11-13 LAB — COMPREHENSIVE METABOLIC PANEL
ALT: 21 U/L (ref 0–35)
AST: 19 U/L (ref 0–37)
Albumin: 4.4 g/dL (ref 3.5–5.2)
Alkaline Phosphatase: 73 U/L (ref 39–117)
BUN: 21 mg/dL (ref 6–23)
CO2: 33 meq/L — ABNORMAL HIGH (ref 19–32)
Calcium: 10 mg/dL (ref 8.4–10.5)
Chloride: 100 meq/L (ref 96–112)
Creatinine, Ser: 0.98 mg/dL (ref 0.40–1.20)
GFR: 55 mL/min — ABNORMAL LOW (ref >60.00)
Glucose, Bld: 109 mg/dL — ABNORMAL HIGH (ref 70–99)
Potassium: 3.9 meq/L (ref 3.5–5.1)
Sodium: 141 meq/L (ref 135–145)
Total Bilirubin: 0.8 mg/dL (ref 0.2–1.2)
Total Protein: 6.4 g/dL (ref 6.0–8.3)

## 2023-11-13 LAB — LIPID PANEL
Cholesterol: 158 mg/dL (ref 0–200)
HDL: 50.5 mg/dL (ref >39.00)
LDL Cholesterol: 87 mg/dL (ref 0–99)
NonHDL: 107.98
Total CHOL/HDL Ratio: 3
Triglycerides: 105 mg/dL (ref 0.0–149.0)
VLDL: 21 mg/dL (ref 0.0–40.0)

## 2023-11-13 LAB — HEMOGLOBIN A1C: Hgb A1c MFr Bld: 6.3 % (ref 4.6–6.5)

## 2023-11-13 LAB — TSH: TSH: 3.07 u[IU]/mL (ref 0.35–5.50)

## 2023-11-13 LAB — T4, FREE: Free T4: 1 ng/dL (ref 0.60–1.60)

## 2023-11-19 ENCOUNTER — Other Ambulatory Visit: Payer: Self-pay | Admitting: Family Medicine

## 2023-11-21 ENCOUNTER — Ambulatory Visit (INDEPENDENT_AMBULATORY_CARE_PROVIDER_SITE_OTHER): Payer: Medicare Other | Admitting: Family Medicine

## 2023-11-21 ENCOUNTER — Encounter: Payer: Self-pay | Admitting: Family Medicine

## 2023-11-21 VITALS — BP 126/62 | HR 67 | Temp 97.6°F | Ht 60.5 in | Wt 188.1 lb

## 2023-11-21 DIAGNOSIS — E038 Other specified hypothyroidism: Secondary | ICD-10-CM | POA: Diagnosis not present

## 2023-11-21 DIAGNOSIS — R5382 Chronic fatigue, unspecified: Secondary | ICD-10-CM

## 2023-11-21 DIAGNOSIS — R7303 Prediabetes: Secondary | ICD-10-CM | POA: Diagnosis not present

## 2023-11-21 DIAGNOSIS — Z1231 Encounter for screening mammogram for malignant neoplasm of breast: Secondary | ICD-10-CM | POA: Diagnosis not present

## 2023-11-21 DIAGNOSIS — D649 Anemia, unspecified: Secondary | ICD-10-CM

## 2023-11-21 DIAGNOSIS — C4360 Malignant melanoma of unspecified upper limb, including shoulder: Secondary | ICD-10-CM | POA: Diagnosis not present

## 2023-11-21 DIAGNOSIS — I10 Essential (primary) hypertension: Secondary | ICD-10-CM

## 2023-11-21 DIAGNOSIS — E7800 Pure hypercholesterolemia, unspecified: Secondary | ICD-10-CM

## 2023-11-21 DIAGNOSIS — M1000 Idiopathic gout, unspecified site: Secondary | ICD-10-CM

## 2023-11-21 DIAGNOSIS — M8589 Other specified disorders of bone density and structure, multiple sites: Secondary | ICD-10-CM

## 2023-11-21 NOTE — Assessment & Plan Note (Signed)
 Mammogram ordered for Andrea Peters  She will call to schedule

## 2023-11-21 NOTE — Assessment & Plan Note (Signed)
 Continues allopurinol No flares

## 2023-11-21 NOTE — Assessment & Plan Note (Signed)
 Instructed to change dosing of simvastatin to evening   Disc goals for lipids and reasons to control them Rev last labs with pt Rev low sat fat diet in detail  Lab Results  Component Value Date   LDLCALC 87 11/13/2023

## 2023-11-21 NOTE — Assessment & Plan Note (Signed)
 Lab Results  Component Value Date   TSH 3.07 11/13/2023   Ft4 normal at 1.0   Will continue to monitor  No clinical changes

## 2023-11-21 NOTE — Patient Instructions (Addendum)
 You are due for tetanus shot  Get it at the pharmacy   I strongly recommend flu shot every fall  Also RSV shot when you can (one time shot)- at pharmacy  Also covid shot yearly - at pharmacy   Stay as active as you can  Gradually increase activity  Add some strength training to your routine, this is important for bone and brain health and can reduce your risk of falls and help your body use insulin properly and regulate weight  Light weights, exercise bands , and internet videos are a good way to start  Yoga (chair or regular), machines , floor exercises or a gym with machines are also good options    Take a look at the handout for generic prolia and talk to your oncologist / radiologist about it  If interested we can see if we can get it covered    Your A1c is higher - closer to the diabetic range Try to get most of your carbohydrates from produce (with the exception of white potatoes) and whole grains Eat less bread/pasta/rice/snack foods/cereals/sweets and other items from the middle of the grocery store (processed carbs)    You have an order for:  []   2D Mammogram  [x]   3D Mammogram  []   Bone Density     Please call for appointment:   [x]   [facility]  [address] [place] [address]  [phone]  []   [facility])   [address]  [place], [place] [address]  [phone]  []   [facility]      [address] [place], [place]        [phone]         []   [facility]  [address] [place], [place]  [phone]  []  [facility]   [address]   [place], [place] [address]  [phone]  []  [facility]  [address] [place], [place] [address] [phone]    Make sure to wear two piece clothing  No  lotions powders or deodorants the day of the appointment Make sure to bring picture ID and insurance card.  Bring list of medications you are currently taking including any supplements.   Schedule your screening mammogram through MyChart!   Select [place] imaging sites can now be scheduled through MyChart.  Log into your MyChart account.  Go to 'Visit' (or 'Appointments' if  on mobile App) --> Schedule an  Appointment  Under 'Select a Reason for Visit' choose the Mammogram  Screening option.  Complete the pre-visit questions  and select the time and place that  best fits your schedule

## 2023-11-21 NOTE — Assessment & Plan Note (Signed)
 Lab Results  Component Value Date   HGBA1C 6.3 11/13/2023   HGBA1C 5.8 10/12/2022   HGBA1C 6.2 02/23/2021   Ate a lot of sweets when sick with cancer treatment Now back on track  disc imp of low glycemic diet and wt loss to prevent DM2

## 2023-11-21 NOTE — Assessment & Plan Note (Signed)
 Recent treatment for mets to lung with XRT Pending report in April for further plan  Feeling better

## 2023-11-21 NOTE — Progress Notes (Signed)
 Subjective:    Patient ID: Andrea Peters, female    DOB: 1946/04/29, 78 y.o.   MRN: [id number]  HPI  Pt presents for annual follow up of chronic medical problems    Wt Readings from Last 3 Encounters:  11/21/23 188 lb 2 oz (85.3 kg)  10/19/23 181 lb 6 oz (82.3 kg)  08/07/23 182 lb (82.6 kg)   36.14 kg/m  Vitals:   11/21/23 0950 11/21/23 1019  BP: (!) 146/78 126/62  Pulse: 67   Temp: 97.6 F (36.4 C)   SpO2: 97%     Immunization History  Administered Date(s) Administered   Fluad Quad(high Dose 65+) 05/12/2020   Influenza,inj,Quad PF,6+ Mos 10/15/2014, 10/19/2015, 08/09/2016, 08/17/2017, 10/04/2018   PFIZER(Purple Top)SARS-COV-2 Vaccination 11/03/2019, 11/26/2019   Pneumococcal Conjugate-13 10/15/2014   Pneumococcal Polysaccharide-23 03/22/2010   Td 01/13/1998, 01/30/2008   Zoster, Live 02/05/2008    Health Maintenance Due  Topic Date Due   MAMMOGRAM  09/27/2023   Does not get flu shots  Declines shingrix / had zostavax in past   Mammogram 09/2022 Wants to schedule this  Self breast exam- no lumps  Declines breast exam here today   Gyn health No problems   Colon cancer screening out aged  Colonoscopy 2015   Bone health  Dexa  03/2023   osteoporosis Did not tolerate alendronate in past  Falls -none  Fractures (in past pathologic spinal fracture due to cancer) -none  Supplements - avoids ca/ was elevated in past  Taking vitamin D  Last vitamin D Lab Results  Component Value Date   VD25OH 70.87 02/23/2021    Exercise : not much  Getting back on her feet    Derm care  Has metastatic  Rad treatment - now done (really hard mentally and zapped her energy)  Will find out in April if she needs to do more    Mood    11/21/2023    9:55 AM 08/07/2023    2:38 PM 07/18/2023    9:37 AM 07/12/2023   10:41 AM 06/28/2023   10:35 AM  Depression screen PHQ 2/9  Decreased Interest 1 1 1 1 2   Down, Depressed, Hopeless 0 0 0 0 0  PHQ - 2 Score 1 1 1  1 2   Altered sleeping 1  1 2 3   Tired, decreased energy 3  3 2 3   Change in appetite 1  1 0 0  Feeling bad or failure about yourself  0  0 0 0  Trouble concentrating 1  1 1 1   Moving slowly or fidgety/restless 0  0 0 0  Suicidal thoughts 0  0 0 0  PHQ-9 Score 7  7 6 9   Difficult doing work/chores Somewhat difficult Not difficult at all Somewhat difficult Somewhat difficult Very difficult   Cares for husband with dementia  Is hard/ he is getting worse   HTN bp is stable today  No cp or palpitations or headaches or edema  No side effects to medicines  BP Readings from Last 3 Encounters:  11/21/23 126/62  10/19/23 134/72  07/18/23 128/70    Miacardis hct , amlodipine   Lab Results  Component Value Date   NA 141 11/13/2023   K 3.9 11/13/2023   CO2 33 (H) 11/13/2023   GLUCOSE 109 (H) 11/13/2023   BUN 21 11/13/2023   CREATININE 0.98 11/13/2023   CALCIUM 10.0 11/13/2023   GFR 55.00 (L) 11/13/2023   GFRNONAA 53.45 03/15/2010   Allopurinol for gout  Lab Results  Component Value Date   LABURIC 4.0 10/12/2022     Subclinical hypothyroid Lab Results  Component Value Date   TSH 3.07 11/13/2023   Free T4 normal at 1.00   Anemia is improved  Lab Results  Component Value Date   WBC 5.8 11/13/2023   HGB 12.5 11/13/2023   HCT 38.5 11/13/2023   MCV 98.9 11/13/2023   PLT 291.0 11/13/2023   Hyperlipidemia Lab Results  Component Value Date   CHOL 158 11/13/2023   CHOL 197 10/12/2022   CHOL 152 02/23/2021   Lab Results  Component Value Date   HDL 50.50 11/13/2023   HDL 50.40 10/12/2022   HDL 56.50 02/23/2021   Lab Results  Component Value Date   LDLCALC 87 11/13/2023   LDLCALC 119 (H) 10/12/2022   LDLCALC 84 02/23/2021   Lab Results  Component Value Date   TRIG 105.0 11/13/2023   TRIG 138.0 10/12/2022   TRIG 58.0 02/23/2021   Lab Results  Component Value Date   CHOLHDL 3 11/13/2023   CHOLHDL 4 10/12/2022   CHOLHDL 3 02/23/2021   Lab Results   Component Value Date   LDLDIRECT 141.9 01/23/2007   Simvastatin 20 mg daily in the evening   Prediabetes Lab Results  Component Value Date   HGBA1C 6.3 11/13/2023   HGBA1C 5.8 10/12/2022   HGBA1C 6.2 02/23/2021   Ate a lot of ice cream and sweets when she was sick    Patient Active Problem List   Diagnosis Date Noted   Anemia 10/19/2023   Dyspepsia 10/19/2023   Caregiver stress 06/28/2023   Mild persistent allergic asthma with acute exacerbation 03/22/2023   Knee pain 10/20/2022   Bursitis of left elbow 10/20/2022   Heart murmur 10/20/2022   Dysuria 05/05/2022   L1 vertebral fracture (HCC) 03/15/2022   Cancer associated pain 03/15/2022   Hypomagnesemia 03/09/2022   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   Subclinical hypothyroidism 03/02/2021   Encounter for screening mammogram for breast cancer 03/02/2021   Prediabetes 10/26/2018   Fatigue 03/29/2016   Osteopenia 10/19/2015   Estrogen deficiency 10/15/2014   Melanoma of upper arm (HCC) 05/08/2013   Gout 11/17/2010   HYPERCHOLESTEROLEMIA, PURE 05/17/2007   Allergic rhinitis 01/23/2007   Essential hypertension 12/20/2006   Past Medical History:  Diagnosis Date   Allergic rhinitis    Allergy 1964   Arthritis 2010   Asthma 2015   CHOLELITHIASIS, HX OF 11/17/2010   Qualifier: Diagnosis of  By: Milinda Antis MD, Colon Flattery    Dysplastic nevus 02/06/2013   R flank - moderate   Hyperglycemia    Hyperlipidemia    Hypertension    Melanoma (HCC) 08/17/2012   back of right arm   Mixed incontinence    Obesity    Plantar fasciitis    with significant disability   Tendonitis of foot    L, chronic foot pain   Past Surgical History:  Procedure Laterality Date   ABDOMINAL HYSTERECTOMY     fibroids, ovaries intact   APPENDECTOMY     BREAST EXCISIONAL BIOPSY Bilateral 1960's -1970's   benign   CHOLECYSTECTOMY     EYE SURGERY Bilateral 12/2015   at [facility]   TENDON REPAIR  01/2006   Tendon rupture L foot   TUBAL LIGATION     Social  History   Tobacco Use   Smoking status: Never   Smokeless tobacco: Never  Vaping Use   Vaping status: Never Used  Substance Use Topics   Alcohol use:  No   Drug use: No   Family History  Problem Relation Age of Onset   Heart attack Mother    Other Mother        ?thyroid problems   Lung cancer Father    Allergies  Allergen Reactions   Alendronate     Hair loss  Nausea Diarrhea    Lipitor [Atorvastatin]     Muscle pain   Naproxen Sodium    Bioflavonoids Rash   Iodinated Contrast Media Rash and Swelling    On the evening after CT scan, mild rash noted on bilateral lower legs stopping at ankles, not itchy, along with mild swelling of bilateral lower legs/ankles/feet   Naproxen Other (See Comments) and Rash    Severe indigestion   Current Outpatient Medications on File Prior to Visit  Medication Sig Dispense Refill   acetaminophen (TYLENOL) 325 MG tablet Take by mouth.     albuterol (VENTOLIN HFA) 108 (90 Base) MCG/ACT inhaler TAKE 2 PUFFS BY MOUTH EVERY 6 HOURS AS NEEDED FOR WHEEZE OR SHORTNESS OF BREATH 18 each 2   allopurinol (ZYLOPRIM) 300 MG tablet TAKE 1 TABLET BY MOUTH EVERY DAY 90 tablet 2   amLODipine (NORVASC) 10 MG tablet TAKE 1 TABLET BY MOUTH EVERY DAY 90 tablet 0   apixaban (ELIQUIS) 5 MG TABS tablet Take 1 tablet (5 mg total) by mouth 2 (two) times daily. 180 tablet 1   benzonatate (TESSALON) 200 MG capsule Take 1 capsule (200 mg total) by mouth 3 (three) times daily as needed for cough. Swallow whole 30 capsule 2   famotidine (PEPCID) 20 MG tablet TAKE 1 TABLET BY MOUTH TWICE A DAY 180 tablet 0   fluticasone (FLONASE) 50 MCG/ACT nasal spray SPRAY 2 SPRAYS INTO EACH NOSTRIL EVERY DAY 48 mL 0   fluticasone furoate-vilanterol (BREO ELLIPTA) 200-25 MCG/ACT AEPB USE 1 INHALATION ORALLY    DAILY 90 each 0   magnesium oxide (MAG-OX) 400 (240 Mg) MG tablet TAKE 1 TABLET (400 MG TOTAL) BY MOUTH IN THE MORNING AND AT BEDTIME. 180 tablet 1   Multiple Vitamin (MULTIVITAMIN)  tablet Take 2 tablets by mouth daily.      simvastatin (ZOCOR) 20 MG tablet TAKE 1 TABLET BY MOUTH EVERYDAY AT BEDTIME 90 tablet 0   telmisartan-hydrochlorothiazide (MICARDIS HCT) 80-25 MG tablet TAKE 1 TABLET BY MOUTH EVERY DAY 90 tablet 0   No current facility-administered medications on file prior to visit.    Review of Systems  Constitutional:  Positive for fatigue. Negative for activity change, appetite change, fever and unexpected weight change.  HENT:  Negative for congestion, ear pain, rhinorrhea, sinus pressure and sore throat.   Eyes:  Negative for pain, redness and visual disturbance.  Respiratory:  Negative for cough, shortness of breath and wheezing.   Cardiovascular:  Negative for chest pain and palpitations.  Gastrointestinal:  Negative for abdominal pain, blood in stool, constipation and diarrhea.  Endocrine: Negative for polydipsia and polyuria.  Genitourinary:  Negative for dysuria, frequency and urgency.  Musculoskeletal:  Positive for arthralgias. Negative for back pain and myalgias.  Skin:  Negative for pallor and rash.  Allergic/Immunologic: Negative for environmental allergies.  Neurological:  Negative for dizziness, syncope and headaches.  Hematological:  Negative for adenopathy. Does not bruise/bleed easily.  Psychiatric/Behavioral:  Negative for decreased concentration and dysphoric mood. The patient is not nervous/anxious.        Objective:   Physical Exam Constitutional:      General: She is not in acute distress.  Appearance: Normal appearance. She is well-developed. She is obese. She is not ill-appearing or diaphoretic.  HENT:     Head: Normocephalic and atraumatic.     Right Ear: Tympanic membrane, ear canal and external ear normal.     Left Ear: Tympanic membrane, ear canal and external ear normal.     Nose: Nose normal. No congestion.     Mouth/Throat:     Mouth: Mucous membranes are moist.     Pharynx: Oropharynx is clear. No posterior  oropharyngeal erythema.  Eyes:     General: No scleral icterus.    Extraocular Movements: Extraocular movements intact.     Conjunctiva/sclera: Conjunctivae normal.     Pupils: Pupils are equal, round, and reactive to light.  Neck:     Thyroid: No thyromegaly.     Vascular: No carotid bruit or JVD.  Cardiovascular:     Rate and Rhythm: Normal rate and regular rhythm.     Pulses: Normal pulses.     Heart sounds: Normal heart sounds.     No gallop.  Pulmonary:     Effort: Pulmonary effort is normal. No respiratory distress.     Breath sounds: Normal breath sounds. No wheezing.     Comments: Good air exch Chest:     Chest wall: No tenderness.  Abdominal:     General: Bowel sounds are normal. There is no distension or abdominal bruit.     Palpations: Abdomen is soft. There is no mass.     Tenderness: There is no abdominal tenderness.     Hernia: No hernia is present.  Genitourinary:    Comments: Declines breast exam  Musculoskeletal:        General: No tenderness. Normal range of motion.     Cervical back: Normal range of motion and neck supple. No rigidity. No muscular tenderness.     Right lower leg: No edema.     Left lower leg: No edema.     Comments: No kyphosis   Lymphadenopathy:     Cervical: No cervical adenopathy.  Skin:    General: Skin is warm and dry.     Coloration: Skin is not pale.     Findings: No erythema or rash.     Comments: Solar lentigines diffusely Some sks   Neurological:     Mental Status: She is alert. Mental status is at baseline.     Cranial Nerves: No cranial nerve deficit.     Motor: No abnormal muscle tone.     Coordination: Coordination normal.     Gait: Gait normal.     Deep Tendon Reflexes: Reflexes are normal and symmetric. Reflexes normal.  Psychiatric:        Mood and Affect: Mood normal.        Cognition and Memory: Cognition and memory normal.           Assessment & Plan:   Problem List Items Addressed This Visit        Cardiovascular and Mediastinum   Essential hypertension - Primary   Blood pressure is better bp in fair control at this time  BP Readings from Last 1 Encounters:  11/21/23 126/62   No changes needed Most recent labs reviewed  Disc lifstyle change with low sodium diet and exercise  Plan to continue miacardis hct 80-25 mg daily  Amlodipine 10 mg daily  Labs today           Endocrine   Subclinical hypothyroidism   Lab Results  Component  Value Date   TSH 3.07 11/13/2023   Ft4 normal at 1.0   Will continue to monitor  No clinical changes        Musculoskeletal and Integument   Osteopenia   Osteoporosis last dexa  Intol of alendronate Given info on prolia to read and discuss with her XRT doctor -let us know if interested Taking vit D Trying to get more active   Discussed fall prevention, supplements and exercise for bone density          Other   Prediabetes   Lab Results  Component Value Date   HGBA1C 6.3 11/13/2023   HGBA1C 5.8 10/12/2022   HGBA1C 6.2 02/23/2021   Ate a lot of sweets when sick with cancer treatment Now back on track  disc imp of low glycemic diet and wt loss to prevent DM2       Melanoma of upper arm (HCC)   Recent treatment for mets to lung with XRT Pending report in April for further plan  Feeling better      HYPERCHOLESTEROLEMIA, PURE   Instructed to change dosing of simvastatin to evening   Disc goals for lipids and reasons to control them Rev last labs with pt Rev low sat fat diet in detail  Lab Results  Component Value Date   LDLCALC 87 11/13/2023          Gout   Continues allopurinol No flares       Fatigue   Slowly improving after XRT for melanoma       Encounter for screening mammogram for breast cancer   Mammogram ordered for [place]  She will call to schedule       Relevant Orders   MM 3D SCREENING MAMMOGRAM BILATERAL BREAST   Anemia   Cbc is normal today

## 2023-11-21 NOTE — Assessment & Plan Note (Signed)
Cbc is normal today

## 2023-11-21 NOTE — Assessment & Plan Note (Signed)
 Blood pressure is better bp in fair control at this time  BP Readings from Last 1 Encounters:  11/21/23 126/62   No changes needed Most recent labs reviewed  Disc lifstyle change with low sodium diet and exercise  Plan to continue miacardis hct 80-25 mg daily  Amlodipine 10 mg daily  Labs today

## 2023-11-21 NOTE — Assessment & Plan Note (Signed)
 Slowly improving after XRT for melanoma

## 2023-11-21 NOTE — Assessment & Plan Note (Signed)
 Osteoporosis last dexa  Intol of alendronate Given info on prolia to read and discuss with her XRT doctor -let us know if interested Taking vit D Trying to get more active   Discussed fall prevention, supplements and exercise for bone density

## 2023-12-01 ENCOUNTER — Other Ambulatory Visit: Payer: Self-pay | Admitting: Family Medicine

## 2023-12-05 ENCOUNTER — Ambulatory Visit
Admission: RE | Admit: 2023-12-05 | Discharge: 2023-12-05 | Disposition: A | Discharge status: Home / Self Care | Source: Ambulatory Visit | Attending: Family Medicine | Admitting: Family Medicine

## 2023-12-05 DIAGNOSIS — Z1231 Encounter for screening mammogram for malignant neoplasm of breast: Secondary | ICD-10-CM | POA: Diagnosis not present

## 2023-12-07 ENCOUNTER — Encounter: Payer: Self-pay | Admitting: Family Medicine

## 2023-12-29 ENCOUNTER — Other Ambulatory Visit: Payer: Self-pay | Admitting: Family Medicine

## 2024-01-01 DIAGNOSIS — Z7952 Long term (current) use of systemic steroids: Secondary | ICD-10-CM | POA: Diagnosis not present

## 2024-01-01 DIAGNOSIS — Z85841 Personal history of malignant neoplasm of brain: Secondary | ICD-10-CM | POA: Diagnosis not present

## 2024-01-01 DIAGNOSIS — C7971 Secondary malignant neoplasm of right adrenal gland: Secondary | ICD-10-CM | POA: Diagnosis not present

## 2024-01-01 DIAGNOSIS — R911 Solitary pulmonary nodule: Secondary | ICD-10-CM | POA: Diagnosis not present

## 2024-01-01 DIAGNOSIS — C4361 Malignant melanoma of right upper limb, including shoulder: Secondary | ICD-10-CM | POA: Diagnosis not present

## 2024-01-01 DIAGNOSIS — K862 Cyst of pancreas: Secondary | ICD-10-CM | POA: Diagnosis not present

## 2024-01-01 DIAGNOSIS — M810 Age-related osteoporosis without current pathological fracture: Secondary | ICD-10-CM | POA: Diagnosis not present

## 2024-01-01 DIAGNOSIS — C787 Secondary malignant neoplasm of liver and intrahepatic bile duct: Secondary | ICD-10-CM | POA: Diagnosis not present

## 2024-01-01 DIAGNOSIS — I2699 Other pulmonary embolism without acute cor pulmonale: Secondary | ICD-10-CM | POA: Diagnosis not present

## 2024-01-01 DIAGNOSIS — R918 Other nonspecific abnormal finding of lung field: Secondary | ICD-10-CM | POA: Diagnosis not present

## 2024-01-01 DIAGNOSIS — C7931 Secondary malignant neoplasm of brain: Secondary | ICD-10-CM | POA: Diagnosis not present

## 2024-01-01 DIAGNOSIS — C7802 Secondary malignant neoplasm of left lung: Secondary | ICD-10-CM | POA: Diagnosis not present

## 2024-01-01 DIAGNOSIS — Z923 Personal history of irradiation: Secondary | ICD-10-CM | POA: Diagnosis not present

## 2024-01-01 DIAGNOSIS — C786 Secondary malignant neoplasm of retroperitoneum and peritoneum: Secondary | ICD-10-CM | POA: Diagnosis not present

## 2024-01-01 DIAGNOSIS — R530 Neoplastic (malignant) related fatigue: Secondary | ICD-10-CM | POA: Diagnosis not present

## 2024-01-01 DIAGNOSIS — C439 Malignant melanoma of skin, unspecified: Secondary | ICD-10-CM | POA: Diagnosis not present

## 2024-01-01 DIAGNOSIS — I959 Hypotension, unspecified: Secondary | ICD-10-CM | POA: Diagnosis not present

## 2024-01-01 DIAGNOSIS — K529 Noninfective gastroenteritis and colitis, unspecified: Secondary | ICD-10-CM | POA: Diagnosis not present

## 2024-01-01 DIAGNOSIS — Z7901 Long term (current) use of anticoagulants: Secondary | ICD-10-CM | POA: Diagnosis not present

## 2024-01-11 ENCOUNTER — Encounter: Payer: Self-pay | Admitting: Dermatology

## 2024-01-11 ENCOUNTER — Ambulatory Visit: Admitting: Dermatology

## 2024-01-11 DIAGNOSIS — C44722 Squamous cell carcinoma of skin of right lower limb, including hip: Secondary | ICD-10-CM | POA: Diagnosis not present

## 2024-01-11 DIAGNOSIS — D0471 Carcinoma in situ of skin of right lower limb, including hip: Secondary | ICD-10-CM | POA: Diagnosis not present

## 2024-01-11 DIAGNOSIS — D485 Neoplasm of uncertain behavior of skin: Secondary | ICD-10-CM | POA: Diagnosis not present

## 2024-01-11 DIAGNOSIS — Z85820 Personal history of malignant melanoma of skin: Secondary | ICD-10-CM | POA: Diagnosis not present

## 2024-01-11 DIAGNOSIS — D492 Neoplasm of unspecified behavior of bone, soft tissue, and skin: Secondary | ICD-10-CM

## 2024-01-11 DIAGNOSIS — D099 Carcinoma in situ, unspecified: Secondary | ICD-10-CM

## 2024-01-11 DIAGNOSIS — L720 Epidermal cyst: Secondary | ICD-10-CM | POA: Diagnosis not present

## 2024-01-11 HISTORY — DX: Carcinoma in situ, unspecified: D09.9

## 2024-01-11 MED ORDER — DOXYCYCLINE HYCLATE 100 MG PO TABS: 100.0000 mg | ORAL_TABLET | Freq: Two times a day (BID) | ORAL | 0 refills | Status: AC

## 2024-01-11 NOTE — Patient Instructions (Addendum)
 Start Doxycycline  100mg  twice a day for 10 days, take with food.   Doxycycline  should be taken with food to prevent nausea. Do not lay down for 30 minutes after taking. Be cautious with sun exposure and use good sun protection while on this medication. Pregnant women should not take this medication.     Wound Care Instructions  Cleanse wound gently with soap and water once a day then pat dry with clean gauze. Apply a thin coat of Petrolatum (petroleum jelly, "Vaseline") over the wound (unless you have an allergy to this). We recommend that you use a new, sterile tube of Vaseline. Do not pick or remove scabs. Do not remove the yellow or white "healing tissue" from the base of the wound.  Cover the wound with fresh, clean, nonstick gauze and secure with paper tape. You may use Band-Aids in place of gauze and tape if the wound is small enough, but would recommend trimming much of the tape off as there is often too much. Sometimes Band-Aids can irritate the skin.  You should call the office for your biopsy report after 1 week if you have not already been contacted.  If you experience any problems, such as abnormal amounts of bleeding, swelling, significant bruising, significant pain, or evidence of infection, please call the office immediately.  FOR ADULT SURGERY PATIENTS: If you need something for pain relief you may take 1 extra strength Tylenol  (acetaminophen ) AND 2 Ibuprofen (200mg  each) together every 4 hours as needed for pain. (do not take these if you are allergic to them or if you have a reason you should not take them.) Typically, you may only need pain medication for 1 to 3 days.      Due to recent changes in healthcare laws, you may see results of your pathology and/or laboratory studies on MyChart before the doctors have had a chance to review them. We understand that in some cases there may be results that are confusing or concerning to you. Please understand that not all results are  received at the same time and often the doctors may need to interpret multiple results in order to provide you with the best plan of care or course of treatment. Therefore, we ask that you please give us  2 business days to thoroughly review all your results before contacting the office for clarification. Should we see a critical lab result, you will be contacted sooner.   If You Need Anything After Your Visit  If you have any questions or concerns for your doctor, please call our main line at [phone] and press option 4 to reach your doctor's medical assistant. If no one answers, please leave a voicemail as directed and we will return your call as soon as possible. Messages left after 4 pm will be answered the following business day.   You may also send us  a message via MyChart. We typically respond to MyChart messages within 1-2 business days.  For prescription refills, please ask your pharmacy to contact our office. Our fax number is [phone].  If you have an urgent issue when the clinic is closed that cannot wait until the next business day, you can page your doctor at the number below.    Please note that while we do our best to be available for urgent issues outside of office hours, we are not available 24/7.   If you have an urgent issue and are unable to reach us , you may choose to seek medical care at your  doctor's office, retail clinic, urgent care center, or emergency room.  If you have a medical emergency, please immediately call 911 or go to the emergency department.  Pager Numbers  - Dr. Bary Likes: [phone]  - Dr. Annette Barters: [phone]  - Dr. Felipe Horton: [phone]   In the event of inclement weather, please call our main line at [phone] for an update on the status of any delays or closures.  Dermatology Medication Tips: Please keep the boxes that topical medications come in in order to help keep track of the instructions about where and how to use these.  Pharmacies typically print the medication instructions only on the boxes and not directly on the medication tubes.   If your medication is too expensive, please contact our office at [phone] option 4 or send us  a message through MyChart.   We are unable to tell what your co-pay for medications will be in advance as this is different depending on your insurance coverage. However, we may be able to find a substitute medication at lower cost or fill out paperwork to get insurance to cover a needed medication.   If a prior authorization is required to get your medication covered by your insurance company, please allow us  1-2 business days to complete this process.  Drug prices often vary depending on where the prescription is filled and some pharmacies may offer cheaper prices.  The website www.goodrx.com contains coupons for medications through different pharmacies. The prices here do not account for what the cost may be with help from insurance (it may be cheaper with your insurance), but the website can give you the price if you did not use any insurance.  - You can print the associated coupon and take it with your prescription to the pharmacy.  - You may also stop by our office during regular business hours and pick up a GoodRx coupon card.  - If you need your prescription sent electronically to a different pharmacy, notify our office through [facility] or by phone at [phone] option 4.     Si Usted Necesita Algo Despus de Su Visita  Tambin puede enviarnos un mensaje a travs de Clinical cytogeneticist. Por lo general respondemos a los mensajes de MyChart en el transcurso de 1 a 2 das hbiles.  Para renovar recetas, por favor pida a su farmacia que se ponga en contacto con nuestra oficina. Franz Jacks de fax es [place] [phone].  Si tiene un asunto urgente cuando la clnica est cerrada y que no puede esperar hasta el siguiente da hbil, puede llamar/localizar a su doctor(a) al nmero  que aparece a continuacin.   Por favor, tenga en cuenta que aunque hacemos todo lo posible para estar disponibles para asuntos urgentes fuera del horario de [place], no estamos disponibles las 24 horas del da, los 7 [address]  [address] de la [place].   Si tiene un problema urgente y no puede comunicarse con nosotros, puede optar por buscar atencin mdica  en el consultorio de su doctor(a), en una clnica privada, en un centro de atencin urgente o en una sala de emergencias.  Si tiene Engineer, drilling, por favor llame inmediatamente al 911 o vaya a la sala de emergencias.  Nmeros de bper  - Dr. Bary Likes: [phone]  - Dra. Annette Barters: [phone]  - Dr. Felipe Horton: [phone]   En caso de inclemencias del tiempo, por favor llame a Lajuan Pila principal al [phone] para una actualizacin sobre el [place] de cualquier retraso o cierre.  Consejos para la medicacin en  dermatologa: Por favor, guarde las cajas en las que vienen los medicamentos de uso tpico para ayudarle a seguir las instrucciones sobre dnde y cmo usarlos. Las farmacias generalmente imprimen las instrucciones del medicamento slo en las cajas y no directamente en los tubos del [place].   Si su medicamento es muy caro, por favor, pngase en contacto con Bettyjane Brunet llamando al [phone] y presione la opcin 4 o envenos un mensaje a travs de Clinical cytogeneticist.   No podemos decirle cul ser su copago por los medicamentos por adelantado ya que esto es diferente dependiendo de la cobertura de su seguro. Sin embargo, es posible que podamos encontrar un medicamento sustituto a Audiological scientist un formulario para que el seguro cubra el medicamento que se considera necesario.   Si se requiere una autorizacin previa para que su compaa de seguros [place] su medicamento, por favor permtanos de 1 a 2 das hbiles para completar este proceso.  Los precios de los medicamentos varan con frecuencia dependiendo del Environmental consultant de dnde se  surte la receta y alguna farmacias pueden ofrecer precios ms baratos.  El sitio web www.goodrx.com tiene cupones para medicamentos de Health and safety inspector. Los precios aqu no tienen en cuenta lo que podra costar con la ayuda del seguro (puede ser ms barato con su seguro), pero el sitio web puede darle el precio si no utiliz Tourist information centre manager.  - Puede imprimir el cupn correspondiente y llevarlo con su receta a la farmacia.  - Tambin puede pasar por nuestra oficina durante el horario de atencin regular y Education officer, museum una tarjeta de cupones de GoodRx.  - Si necesita que su receta se enve electrnicamente a una farmacia diferente, informe a nuestra oficina a travs de MyChart de [place] o por telfono llamando al [phone] y presione la opcin 4.

## 2024-01-11 NOTE — Progress Notes (Signed)
 Follow-Up Visit   Subjective  Andrea Peters is a 78 y.o. female who presents for the following: place on back present x3-4 weeks is sore & painful, R foot present x several months, irritating where shoe rubs.   Patient with history of melanoma, patient with stage IV cancer oncologist is at [facility], Dr. Melanie Spires.   The patient has spots, moles and lesions to be evaluated, some may be new or changing and the patient may have concern these could be cancer.   The following portions of the chart were reviewed this encounter and updated as appropriate: medications, allergies, medical history  Review of Systems:  No other skin or systemic complaints except as noted in HPI or Assessment and Plan.  Objective  Well appearing patient in no apparent distress; mood and affect are within normal limits.  A focused examination was performed of the following areas: Back  Relevant exam findings are noted in the Assessment and Plan.  R lateral foot 16mm x 11mm raised scaly plaque   Left back Subcutaneous fluctuant tender plaque   Assessment & Plan    EPIDERMAL CYST   Related Procedures Anaerobic and Aerobic Culture NEOPLASM OF SKIN (2) R lateral foot Skin / nail biopsy Type of biopsy: tangential   Informed consent: discussed and consent obtained   Timeout: patient name, date of birth, surgical site, and procedure verified   Procedure prep:  Patient was prepped and draped in usual sterile fashion Prep type:  Isopropyl alcohol Anesthesia: the lesion was anesthetized in a standard fashion   Anesthetic:  1% lidocaine w/ epinephrine 1-100,000 buffered w/ 8.4% NaHCO3 Instrument used: DermaBlade   Hemostasis achieved with: pressure and aluminum chloride   Outcome: patient tolerated procedure well   Post-procedure details: sterile dressing applied and wound care instructions given   Dressing type: bandage and petrolatum   Specimen 1 - Surgical pathology Differential Diagnosis: Tinea  Pedis vs Porokeratosis vs ISK vs SCC  Check Margins: No 16mm x 11mm raised scaly plaque  Left back Skin / nail biopsy Type of biopsy: punch   Informed consent: discussed and consent obtained   Timeout: patient name, date of birth, surgical site, and procedure verified   Procedure prep:  Patient was prepped and draped in usual sterile fashion Prep type:  Isopropyl alcohol Anesthesia: the lesion was anesthetized in a standard fashion   Anesthesia comment:  Area prepped with alcohol Anesthetic:  1% lidocaine w/ epinephrine 1-100,000 buffered w/ 8.4% NaHCO3 (7 ml) Punch size:  4 mm Hemostasis achieved with: pressure and aluminum chloride   Outcome: patient tolerated procedure well   Post-procedure details: sterile dressing applied and wound care instructions given   Post-procedure details comment:  Ointment and small bandage applied Dressing type: bandage   Additional details:  After punch incision, copious purulent and cyst material came out of incision hole. Applied pressure to extract additional drainage. Left open to continue drainage Specimen 2 - Surgical pathology Differential Diagnosis: Cyst vs abscess vs other  Check Margins: No Discussed that any wound below the knee take longer to heal.   Back inflamed cyst vs abscess: Aerobic culture  Start Doxycycline  100mg  BID for 10 days, take with food.   Doxycycline  should be taken with food to prevent nausea. Do not lay down for 30 minutes after taking. Be cautious with sun exposure and use good sun protection while on this medication. Pregnant women should not take this medication.    Return for PRN.  I, Jacquelynn Vera, CMA, am  acting as scribe for Harris Liming, MD .   Documentation: I have reviewed the above documentation for accuracy and completeness, and I agree with the above.  Harris Liming, MD

## 2024-01-16 ENCOUNTER — Ambulatory Visit: Payer: Self-pay | Admitting: Dermatology

## 2024-01-16 LAB — SURGICAL PATHOLOGY

## 2024-01-17 MED ORDER — FLUOROURACIL 5 % EX CREA: TOPICAL_CREAM | CUTANEOUS | 2 refills | Status: AC

## 2024-01-17 NOTE — Telephone Encounter (Signed)
 Discussed pathology results and treatment options. Patient voiced understanding. She prefers topical 5FU/Calcipotriene. Follow up scheduled for 2 months.

## 2024-01-17 NOTE — Telephone Encounter (Signed)
-----   Message from [facility] sent at 01/16/2024  5:16 PM EDT ----- Diagnosis 1. Skin, R lateral foot :       SQUAMOUS CELL CARCINOMA IN SITU, BASE INVOLVED        2. Skin, left back :       MINIMAL HISTOLOGIC CHANGES, SEE DESCRIPTION     Please call with diagnosis and message me with patient's decision on treatment.  RIGHT FOOT  Explanation: Biopsy shows a squamous cell skin cancer limited to the top layer of skin. This means it is an early cancer and has not spread. However, it has the potential to spread beyond the skin and threaten your health, so we recommend treating it.   Treatment option 1: a cream (fluorouracil and calcipotriene) that helps your immune system clear the skin cancer. It will cause redness and irritation. Wait two weeks after the biopsy to start applying the cream. Apply the cream twice per day until the redness and irritation develop (usually occurs by day 7), then stop and allow it to heal. We will recheck the area in 2 months to ensure the cancer is gone. The cream is $45 plus shipping and will be mailed to you from a low cost compounding pharmacy.  Treatment option 2: Mohs surgery, which involves cutting out right around the skin cancer and then checking under the microscope on the same day to ensure the whole skin cancer is out. If there is more cancer remaining, the surgeon will repeat the process until it is fully cleared. The cure rate is about 98-99%. It is done at another office outside of [place] ([place], [place], or [place]). Once the Mohs surgeon confirms the skin cancer is out, they will discuss the options to repair or heal the area. You must take it easy for about two weeks after surgery (no lifting over 10-15 lbs, avoid activity to get your heart rate and blood pressure up).  BACK Biopsy shows the skin overlying the cyst was healthy. No further treatment for now. When the inflammation resolves, you can schedule for cyst excision if  desired.

## 2024-01-20 LAB — ANAEROBIC AND AEROBIC CULTURE: Result 1: NEGATIVE — AB

## 2024-01-29 ENCOUNTER — Other Ambulatory Visit: Payer: Self-pay | Admitting: Family Medicine

## 2024-02-02 ENCOUNTER — Ambulatory Visit: Payer: Self-pay

## 2024-02-02 ENCOUNTER — Telehealth: Payer: Self-pay | Admitting: *Deleted

## 2024-02-02 NOTE — Telephone Encounter (Signed)
Pt notified of Dr. Tower's comments and recommendations  

## 2024-02-02 NOTE — Telephone Encounter (Signed)
 Copied from CRM [id number]. Topic: Clinical - Medication Question >> Feb 02, 2024 10:57 AM Adonis Hoot wrote: Reason for CRM: Patient has been taking benzonatate  (TESSALON ) 200 MG capsule for a cough. She stated that she has been prescribed a cough syrup in the past for her cough,and would like to know if she could be prescribed more for the cough she is dealing with now?  [id number] [place], [place] - [address]  Phone: [phone] Fax: [phone]

## 2024-02-02 NOTE — Telephone Encounter (Signed)
 I am out of town and cannot prescribe anything for cough without evaluation to make sure there is no infection , (she can check in with oncology if this is related to her melanoma)  Delsym is helpful over the counter  If productive , an expectorant like mucinex

## 2024-02-02 NOTE — Telephone Encounter (Signed)
 Needs to be triaged, thanks  I am currently out of town

## 2024-02-02 NOTE — Telephone Encounter (Signed)
    Chief Complaint: Non-productive cough, Tessalon  not helping. No availability for OV today. Asking for cough syrup to be sent to pharmacy. Symptoms: above Frequency: x 1 week Pertinent Negatives: Patient denies fever Disposition: [] ED /[] Urgent Care (no appt availability in office) / [] Appointment(In office/virtual)/ []  [facility]/ [] Home Care/ [] Refused Recommended Disposition /[] [facility]/ [x]  Follow-up with PCP Additional Notes: Please advise pt.  Reason for Disposition  [1] Continuous (nonstop) coughing interferes with work or school AND [2] no improvement using cough treatment per Care Advice  Answer Assessment - Initial Assessment Questions 1. ONSET: "When did the cough begin?"      1 week ago 2. SEVERITY: "How bad is the cough today?"      severe 3. SPUTUM: "Describe the color of your sputum" (none, dry cough; clear, white, yellow, green)     none 4. HEMOPTYSIS: "Are you coughing up any blood?" If so ask: "How much?" (flecks, streaks, tablespoons, etc.)     no 5. DIFFICULTY BREATHING: "Are you having difficulty breathing?" If Yes, ask: "How bad is it?" (e.g., mild, moderate, severe)    - MILD: No SOB at rest, mild SOB with walking, speaks normally in sentences, can lie down, no retractions, pulse < 100.    - MODERATE: SOB at rest, SOB with minimal exertion and prefers to sit, cannot lie down flat, speaks in phrases, mild retractions, audible wheezing, pulse 100-120.    - SEVERE: Very SOB at rest, speaks in single words, struggling to breathe, sitting hunched forward, retractions, pulse > 120      mild 6. FEVER: "Do you have a fever?" If Yes, ask: "What is your temperature, how was it measured, and when did it start?"     no 7. CARDIAC HISTORY: "Do you have any history of heart disease?" (e.g., heart attack, congestive heart failure)      no 8. LUNG HISTORY: "Do you have any history of lung disease?"  (e.g., pulmonary embolus, asthma, emphysema)      Lung cancer 9. PE RISK FACTORS: "Do you have a history of blood clots?" (or: recent major surgery, recent prolonged travel, bedridden)     no 10. OTHER SYMPTOMS: "Do you have any other symptoms?" (e.g., runny nose, wheezing, chest pain)       Sore throat from coughing 11. PREGNANCY: "Is there any chance you are pregnant?" "When was your last menstrual period?"       no 12. TRAVEL: "Have you traveled out of the country in the last month?" (e.g., travel history, exposures)       no  Protocols used: Cough - Acute Non-Productive-A-AH

## 2024-02-08 ENCOUNTER — Ambulatory Visit (INDEPENDENT_AMBULATORY_CARE_PROVIDER_SITE_OTHER): Admitting: Family Medicine

## 2024-02-08 ENCOUNTER — Encounter: Payer: Self-pay | Admitting: Family Medicine

## 2024-02-08 VITALS — BP 112/60 | HR 86 | Temp 98.6°F | Ht 60.5 in | Wt 186.0 lb

## 2024-02-08 DIAGNOSIS — K141 Geographic tongue: Secondary | ICD-10-CM | POA: Insufficient documentation

## 2024-02-08 DIAGNOSIS — J4531 Mild persistent asthma with (acute) exacerbation: Secondary | ICD-10-CM | POA: Diagnosis not present

## 2024-02-08 MED ORDER — PREDNISONE 20 MG PO TABS: ORAL_TABLET | ORAL | 0 refills | Status: DC

## 2024-02-08 MED ORDER — AZITHROMYCIN 250 MG PO TABS: ORAL_TABLET | ORAL | 0 refills | Status: DC

## 2024-02-08 NOTE — Assessment & Plan Note (Signed)
 Acute, Immunocompromised patient with recent lung radiation for metastatic melanoma. Concerning for bacterial superinfection given symptoms ongoing now for 2 weeks. Given dry hacking constant cough, possible asthma exacerbation although she is moving air well and has no wheeze. Will treat with azithromycin  as well as prednisone  taper.  Next  Return and ER precautions provided.

## 2024-02-08 NOTE — Progress Notes (Signed)
 Patient ID: Andrea Peters, female    DOB: 30-Nov-1945, 78 y.o.   MRN: [id number]  This visit was conducted in person.  BP 112/60   Pulse 86   Temp 98.6 F (37 C) (Oral)   Ht 5' 0.5" (1.537 m)   Wt 186 lb (84.4 kg)   SpO2 95%   BMI 35.73 kg/m    CC:  Chief Complaint  Patient presents with   Cough    X 2 week   Fatigue   Nasal Congestion    Subjective:   HPI: Andrea Peters is a 78 y.o. female presenting on 02/08/2024 for Cough (X 2 week), Fatigue, and Nasal Congestion    Date of onset:  2 weeks Initial symptoms included  ST, chest congestion and  dry cough Symptoms progressed to nasal congetion.  NO wheeze, no SOB.  No ear pain, no face pain.  No ST.  Fatigue     Sick contacts:  COVID testing:   none     She has tried to treat with  benzonatate , mucinex cough syrup off and on.   Does Breo daily , flonase .   Not using albuterol  inhaler.   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 Non-smoker.   ON immunotherapy  Recent radiation.      Relevant past medical, surgical, family and social history reviewed and updated as indicated. Interim medical history since our last visit reviewed. Allergies and medications reviewed and updated. Outpatient Medications Prior to Visit  Medication Sig Dispense Refill   acetaminophen  (TYLENOL ) 325 MG tablet Take by mouth.     albuterol  (VENTOLIN  HFA) 108 (90 Base) MCG/ACT inhaler TAKE 2 PUFFS BY MOUTH EVERY 6 HOURS AS NEEDED FOR WHEEZE OR SHORTNESS OF BREATH 18 each 2   allopurinol  (ZYLOPRIM ) 300 MG tablet TAKE 1 TABLET BY MOUTH EVERY DAY 90 tablet 2   amLODipine  (NORVASC ) 10 MG tablet TAKE 1 TABLET BY MOUTH EVERY DAY 90 tablet 1   apixaban  (ELIQUIS ) 5 MG TABS tablet Take 1 tablet (5 mg total) by mouth 2 (two) times daily. 180 tablet 1   benzonatate  (TESSALON ) 200 MG capsule Take 1 capsule (200 mg total) by mouth 3 (three) times daily as needed for cough.  Swallow whole 30 capsule 2   fluorouracil  (EFUDEX ) 5 % cream Wait two weeks after the biopsy before starting the cream. Apply the cream twice per day to the area where the skin cancer was and a quarter inch around that area. Apply until the redness and irritation develop (usually occurs by day 7), then stop and allow it to heal. Protect the area from sunlight while it is healing with SPF30+ sunscreen. 30 g 2   fluticasone  (FLONASE ) 50 MCG/ACT nasal spray SPRAY 2 SPRAYS INTO EACH NOSTRIL EVERY DAY 48 mL 1   fluticasone  furoate-vilanterol (BREO ELLIPTA ) 200-25 MCG/ACT AEPB USE 1 INHALATION ORALLY    DAILY 90 each 2   magnesium  oxide (MAG-OX) 400 (240 Mg) MG tablet TAKE 1 TABLET (400 MG TOTAL) BY MOUTH IN THE MORNING AND AT BEDTIME. 180 tablet 1   Multiple Vitamin (MULTIVITAMIN) tablet Take 2 tablets by mouth daily.      simvastatin  (ZOCOR ) 20 MG tablet TAKE 1 TABLET BY MOUTH EVERYDAY AT BEDTIME 90 tablet 0   telmisartan -hydrochlorothiazide  (MICARDIS  HCT) 80-25 MG tablet TAKE 1 TABLET BY MOUTH EVERY DAY 90 tablet 1   famotidine  (PEPCID ) 20 MG tablet Take 1 tablet by mouth  2 (two) times daily. (Patient not taking: Reported on 01/11/2024)     famotidine  (PEPCID ) 20 MG tablet TAKE 1 TABLET BY MOUTH TWICE A DAY 180 tablet 2   No facility-administered medications prior to visit.     Per HPI unless specifically indicated in ROS section below Review of Systems  Constitutional:  Negative for fatigue and fever.  HENT:  Positive for congestion.   Eyes:  Negative for pain.  Respiratory:  Positive for cough. Negative for shortness of breath.   Cardiovascular:  Negative for chest pain, palpitations and leg swelling.  Gastrointestinal:  Negative for abdominal pain.  Genitourinary:  Negative for dysuria and vaginal bleeding.  Musculoskeletal:  Negative for back pain.  Neurological:  Negative for syncope, light-headedness and headaches.  Psychiatric/Behavioral:  Negative for dysphoric mood.    Objective:  BP  112/60   Pulse 86   Temp 98.6 F (37 C) (Oral)   Ht 5' 0.5" (1.537 m)   Wt 186 lb (84.4 kg)   SpO2 95%   BMI 35.73 kg/m   Wt Readings from Last 3 Encounters:  02/08/24 186 lb (84.4 kg)  11/21/23 188 lb 2 oz (85.3 kg)  10/19/23 181 lb 6 oz (82.3 kg)      Physical Exam Constitutional:      General: She is not in acute distress.    Appearance: She is well-developed. She is not ill-appearing or toxic-appearing.  HENT:     Head: Normocephalic.     Right Ear: Hearing, tympanic membrane, ear canal and external ear normal. Tympanic membrane is not erythematous, retracted or bulging.     Left Ear: Hearing, tympanic membrane, ear canal and external ear normal. Tympanic membrane is not erythematous, retracted or bulging.     Nose: Mucosal edema and rhinorrhea present.     Right Sinus: No maxillary sinus tenderness or frontal sinus tenderness.     Left Sinus: No maxillary sinus tenderness or frontal sinus tenderness.     Mouth/Throat:     Pharynx: Uvula midline.  Eyes:     General: Lids are normal. Lids are everted, no foreign bodies appreciated.     Conjunctiva/sclera: Conjunctivae normal.     Pupils: Pupils are equal, round, and reactive to light.  Neck:     Thyroid : No thyroid  mass or thyromegaly.     Vascular: No carotid bruit.     Trachea: Trachea normal.  Cardiovascular:     Rate and Rhythm: Normal rate and regular rhythm.     Pulses: Normal pulses.     Heart sounds: Normal heart sounds, S1 normal and S2 normal. No murmur heard.    No friction rub. No gallop.  Pulmonary:     Effort: Pulmonary effort is normal. No tachypnea or respiratory distress.     Breath sounds: Normal breath sounds. No decreased breath sounds, wheezing, rhonchi or rales.     Comments:  Frequent dry cough Musculoskeletal:     Cervical back: Normal range of motion and neck supple.  Skin:    General: Skin is warm and dry.     Findings: No rash.  Neurological:     Mental Status: She is alert.   Psychiatric:        Mood and Affect: Mood is not anxious or depressed.        Speech: Speech normal.        Behavior: Behavior normal. Behavior is cooperative.        Judgment: Judgment normal.  Results for orders placed or performed in visit on 01/11/24  Anaerobic and Aerobic Culture   Collection Time: 01/11/24 12:00 AM   Specimen: Skin, Other   Skin Left back  Result Value Ref Range   Anaerobic Culture Final report (A)    Result 1 Finegoldia magna (A)    Result 2 Comment (A)    Aerobic Culture Final report    Result 1 Comment   Surgical pathology   Collection Time: 01/11/24 12:00 AM  Result Value Ref Range   SURGICAL PATHOLOGY      SURGICAL PATHOLOGY [facility] [address], [address] [place], [place] [address] Telephone [phone] or [phone] Fax [phone]  REPORT OF DERMATOPATHOLOGY   Accession #: [id number] Patient Name: LAURAL, EILAND Visit # : [id number]  MRN: [id number] Physician: Harris Liming DOB/Age [birth year] (Age: 2) Gender: F Collected Date: 01/11/2024 Received Date: 01/11/2024  FINAL DIAGNOSIS       1. Skin, R lateral foot :       SQUAMOUS CELL CARCINOMA IN SITU, BASE INVOLVED       2. Skin, left back :       MINIMAL HISTOLOGIC CHANGES, SEE DESCRIPTION       ELECTRONIC SIGNATURE : YANG M.D., HONGYU, Dermatopathologist  MICROSCOPIC DESCRIPTION 1. The epidermis exhibits full thickness keratinocytic atypia.This is a squamous cell carcinoma in situ.The process extends to the base making it difficult to exclude deeper involvement. 2. Multiple deeper levels are obtained and reviewed.No evidence of cystic lesion or abscess seen.T his may be due to sampling bias.No atypical feature identified in the sections examined.  CASE COMMENTS STAINS USED IN DIAGNOSIS: H&E H&E *RECUT DEEPER X 3 LEVELS    CLINICAL HISTORY  SPECIMEN(S) OBTAINED 1. Skin, R Lateral Foot 2. Skin, Left Back  SPECIMEN  COMMENTS: 1. 16mm x 11mm raised scaly plaque 2. Subcutaneous fluctuant tender plaque SPECIMEN CLINICAL INFORMATION: 1. Neoplasm of skin, tinea pedis vs porokeratosis vs ISK vs SCC 2. Neoplasm of skin, cyst vs abscess vs other    Gross Description 1. Formalin fixed specimen received:  7 X 6 X 1 MM, TOTO (3 P) (1 B) ( QB ) 2. Formalin fixed specimen received:  5 X 4 X 3 MM, TOTO (2 P) (1 B) ( QB )        Report signed out from the following location(s) [place] [address] [place], [place] [address] CLIA #: [id number]  [facility] [address] [place], [place] [address] CLIA #: [id number]     Assessment and Plan  Mild persistent asthma with exacerbation Assessment & Plan:  Acute, Immunocompromised patient with recent lung radiation for metastatic melanoma. Concerning for bacterial superinfection given symptoms ongoing now for 2 weeks. Given dry hacking constant cough, possible asthma exacerbation although she is moving air well and has no wheeze. Will treat with azithromycin  as well as prednisone  taper.  Next  Return and ER precautions provided.   Geographic tongue Assessment & Plan: Patient had not noted any irritation of the tongue.  I noticed changes on exam.  Possible geographic tongue versus side effects to medication.  Not consistent with yeast infection. If she becomes symptomatic she will let us  know.  She will also review tongue changes with prescribing doctor for immunotherapy at [facility] [address]   Other orders -     Azithromycin ; 2 tab po x 1 day then 1 tab po daily  Dispense: 6 tablet; Refill: 0 -  predniSONE ; 3 tabs by mouth daily x 3 days, then 2 tabs by mouth daily x 2 days then 1 tab by mouth daily x 2 days  Dispense: 15 tablet; Refill: 0    No follow-ups on file.   Herby Lolling, MD

## 2024-02-08 NOTE — Assessment & Plan Note (Addendum)
 Patient had not noted any irritation of the tongue.  I noticed changes on exam.  Possible geographic tongue versus side effects to medication.  Not consistent with yeast infection. If she becomes symptomatic she will let us  know.  She will also review tongue changes with prescribing doctor for immunotherapy at [facility].

## 2024-02-16 ENCOUNTER — Other Ambulatory Visit: Payer: Self-pay | Admitting: Family Medicine

## 2024-02-16 DIAGNOSIS — I2699 Other pulmonary embolism without acute cor pulmonale: Secondary | ICD-10-CM

## 2024-02-16 NOTE — Telephone Encounter (Signed)
 Last filled on 07/25/23 # 180 tabs/ 1 refill  CPE was on 11/21/23

## 2024-03-19 ENCOUNTER — Ambulatory Visit (INDEPENDENT_AMBULATORY_CARE_PROVIDER_SITE_OTHER): Admitting: Dermatology

## 2024-03-19 ENCOUNTER — Encounter: Payer: Self-pay | Admitting: Dermatology

## 2024-03-19 DIAGNOSIS — D0471 Carcinoma in situ of skin of right lower limb, including hip: Secondary | ICD-10-CM | POA: Diagnosis not present

## 2024-03-19 DIAGNOSIS — D099 Carcinoma in situ, unspecified: Secondary | ICD-10-CM

## 2024-03-19 DIAGNOSIS — Z872 Personal history of diseases of the skin and subcutaneous tissue: Secondary | ICD-10-CM

## 2024-03-19 NOTE — Progress Notes (Signed)
   Follow-Up Visit   Subjective  Andrea Peters is a 78 y.o. female who presents for the following: Recheck SCCIS site at R lat foot - pt used topical 5FU/Calcipotriene mix BID x 7 days about a month ago. Area did get red and irritated. Pt states that Doxycycline  caused a bright red rash on the legs and joint swelling when she took it for the inflamed cyst on her back.  The following portions of the chart were reviewed this encounter and updated as appropriate: medications, allergies, medical history  Review of Systems:  No other skin or systemic complaints except as noted in HPI or Assessment and Plan.  Objective  Well appearing patient in no apparent distress; mood and affect are within normal limits.   A focused examination was performed of the following areas: the R foot   Relevant exam findings are noted in the Assessment and Plan.  R lat foot Slight scale and well-healed scar.  Assessment & Plan    SQUAMOUS CELL CARCINOMA IN SITU R lat foot Recheck at follow up in 2-3 months at [facility] appointment. If still scaly may restart 5FU/Calcipotriene mix at follow up.  Edema of the lower legs - pt has tried using compression stockings, but feels it makes swelling worse. She is being followed by PCP and oncologist.  Hx of EPIDERMAL INCLUSION CYST Exam:  L back clear today.  No tx needed.   Return in about 3 months (around 06/19/2024) for TBSE - Hx SCC, MM, dysplastic nevus.  LILLETTE Rosina Mayans, CMA, am acting as scribe for Boneta Sharps, MD .   Documentation: I have reviewed the above documentation for accuracy and completeness, and I agree with the above.  Boneta Sharps, MD

## 2024-03-19 NOTE — Patient Instructions (Signed)

## 2024-04-03 ENCOUNTER — Other Ambulatory Visit: Payer: Self-pay | Admitting: Family Medicine

## 2024-04-09 DIAGNOSIS — K029 Dental caries, unspecified: Secondary | ICD-10-CM | POA: Diagnosis not present

## 2024-04-09 DIAGNOSIS — R112 Nausea with vomiting, unspecified: Secondary | ICD-10-CM | POA: Diagnosis not present

## 2024-04-09 DIAGNOSIS — K047 Periapical abscess without sinus: Secondary | ICD-10-CM | POA: Diagnosis not present

## 2024-04-09 DIAGNOSIS — E876 Hypokalemia: Secondary | ICD-10-CM | POA: Diagnosis not present

## 2024-04-09 DIAGNOSIS — C4361 Malignant melanoma of right upper limb, including shoulder: Secondary | ICD-10-CM | POA: Diagnosis not present

## 2024-04-09 DIAGNOSIS — C787 Secondary malignant neoplasm of liver and intrahepatic bile duct: Secondary | ICD-10-CM | POA: Diagnosis not present

## 2024-04-09 DIAGNOSIS — K862 Cyst of pancreas: Secondary | ICD-10-CM | POA: Diagnosis not present

## 2024-04-09 DIAGNOSIS — C7931 Secondary malignant neoplasm of brain: Secondary | ICD-10-CM | POA: Diagnosis not present

## 2024-04-09 DIAGNOSIS — Z86711 Personal history of pulmonary embolism: Secondary | ICD-10-CM | POA: Diagnosis not present

## 2024-04-09 DIAGNOSIS — C786 Secondary malignant neoplasm of retroperitoneum and peritoneum: Secondary | ICD-10-CM | POA: Diagnosis not present

## 2024-04-09 DIAGNOSIS — C439 Malignant melanoma of skin, unspecified: Secondary | ICD-10-CM | POA: Diagnosis not present

## 2024-04-09 DIAGNOSIS — R530 Neoplastic (malignant) related fatigue: Secondary | ICD-10-CM | POA: Diagnosis not present

## 2024-04-09 DIAGNOSIS — C7802 Secondary malignant neoplasm of left lung: Secondary | ICD-10-CM | POA: Diagnosis not present

## 2024-04-09 DIAGNOSIS — R5383 Other fatigue: Secondary | ICD-10-CM | POA: Diagnosis not present

## 2024-04-09 DIAGNOSIS — Z923 Personal history of irradiation: Secondary | ICD-10-CM | POA: Diagnosis not present

## 2024-04-16 DIAGNOSIS — D3142 Benign neoplasm of left ciliary body: Secondary | ICD-10-CM | POA: Diagnosis not present

## 2024-06-13 ENCOUNTER — Other Ambulatory Visit: Payer: Self-pay | Admitting: Family Medicine

## 2024-06-18 DIAGNOSIS — H35363 Drusen (degenerative) of macula, bilateral: Secondary | ICD-10-CM | POA: Diagnosis not present

## 2024-06-20 ENCOUNTER — Ambulatory Visit: Admitting: Dermatology

## 2024-06-25 ENCOUNTER — Ambulatory Visit: Admitting: Dermatology

## 2024-06-25 ENCOUNTER — Encounter: Payer: Self-pay | Admitting: Dermatology

## 2024-06-25 DIAGNOSIS — L578 Other skin changes due to chronic exposure to nonionizing radiation: Secondary | ICD-10-CM

## 2024-06-25 DIAGNOSIS — D1801 Hemangioma of skin and subcutaneous tissue: Secondary | ICD-10-CM | POA: Diagnosis not present

## 2024-06-25 DIAGNOSIS — L821 Other seborrheic keratosis: Secondary | ICD-10-CM | POA: Diagnosis not present

## 2024-06-25 DIAGNOSIS — L814 Other melanin hyperpigmentation: Secondary | ICD-10-CM

## 2024-06-25 DIAGNOSIS — Z86018 Personal history of other benign neoplasm: Secondary | ICD-10-CM | POA: Diagnosis not present

## 2024-06-25 DIAGNOSIS — Z85820 Personal history of malignant melanoma of skin: Secondary | ICD-10-CM

## 2024-06-25 DIAGNOSIS — Z86007 Personal history of in-situ neoplasm of skin: Secondary | ICD-10-CM | POA: Diagnosis not present

## 2024-06-25 DIAGNOSIS — W908XXA Exposure to other nonionizing radiation, initial encounter: Secondary | ICD-10-CM

## 2024-06-25 DIAGNOSIS — Z1283 Encounter for screening for malignant neoplasm of skin: Secondary | ICD-10-CM

## 2024-06-25 DIAGNOSIS — D229 Melanocytic nevi, unspecified: Secondary | ICD-10-CM

## 2024-06-25 NOTE — Patient Instructions (Signed)

## 2024-06-25 NOTE — Progress Notes (Signed)
 Follow-Up Visit   Subjective  Andrea Peters is a 78 y.o. female who presents for the following: Skin Cancer Screening and Full Body Skin Exam  The patient presents for Total-Body Skin Exam (TBSE) for skin cancer screening and mole check. The patient has spots, moles and lesions to be evaluated, some may be new or changing and the patient may have concern these could be cancer.  Hx MM, SCCis, DN.   The following portions of the chart were reviewed this encounter and updated as appropriate: medications, allergies, medical history  Review of Systems:  No other skin or systemic complaints except as noted in HPI or Assessment and Plan.  Objective  Well appearing patient in no apparent distress; mood and affect are within normal limits.  A full examination was performed including scalp, head, eyes, ears, nose, lips, neck, chest, axillae, abdomen, back, buttocks, bilateral upper extremities, bilateral lower extremities, hands, feet, fingers, toes, fingernails, and toenails. All findings within normal limits unless otherwise noted below.   Exam of nails limited by presence of nail polish.  Relevant physical exam findings are noted in the Assessment and Plan.    Assessment & Plan   SKIN CANCER SCREENING PERFORMED TODAY.  ACTINIC DAMAGE - Chronic condition, secondary to cumulative UV/sun exposure - diffuse scaly erythematous macules with underlying dyspigmentation - Recommend daily broad spectrum sunscreen SPF 30+ to sun-exposed areas, reapply every 2 hours as needed.  - Staying in the shade or wearing long sleeves, sun glasses (UVA+UVB protection) and wide brim hats (4-inch brim around the entire circumference of the hat) are also recommended for sun protection.  - Call for new or changing lesions.  LENTIGINES, SEBORRHEIC KERATOSES, HEMANGIOMAS - Benign normal skin lesions - Benign-appearing - Call for any changes  MELANOCYTIC NEVI - Tan-brown and/or pink-flesh-colored  symmetric macules and papules - Benign appearing on exam today - Observation - Call clinic for new or changing moles - Recommend daily use of broad spectrum spf 30+ sunscreen to sun-exposed areas.   History of Dysplastic Nevi - No evidence of recurrence today - Recommend regular full body skin exams - Recommend daily broad spectrum sunscreen SPF 30+ to sun-exposed areas, reapply every 2 hours as needed.  - Call if any new or changing lesions are noted between office visits  HISTORY OF MELANOMA - right arm, 2013 - No evidence of recurrence today - No lymphadenopathy - Recommend regular full body skin exams - Recommend daily broad spectrum sunscreen SPF 30+ to sun-exposed areas, reapply every 2 hours as needed.  - Call if any new or changing lesions are noted between office visits - offered q3 or q74m checks. Patient prefers yearly due to going to [facility] 4 times per year  HISTORY OF SQUAMOUS CELL CARCINOMA IN SITU OF THE SKIN - No evidence of recurrence today - Recommend regular full body skin exams - Recommend daily broad spectrum sunscreen SPF 30+ to sun-exposed areas, reapply every 2 hours as needed.  - Call if any new or changing lesions are noted between office visits - right lateral foot, patient will restart 5FU/calcipotriene twice daily until red and irritated. Recheck on follow up.  MULTIPLE BENIGN NEVI   LENTIGINES   ACTINIC ELASTOSIS   CHERRY ANGIOMA   SEBORRHEIC KERATOSES   Return in about 1 year (around 06/25/2025) for TBSE, with Dr. Claudene, HxSCCis, HxMM, HxDN.  Andrea Peters, RMA, am acting as scribe for Boneta Claudene, MD .   Documentation: I have reviewed the above documentation for accuracy  and completeness, and I agree with the above.  Boneta Sharps, MD

## 2024-06-26 ENCOUNTER — Encounter: Payer: Self-pay | Admitting: Family

## 2024-06-26 ENCOUNTER — Ambulatory Visit: Admitting: Family

## 2024-06-26 ENCOUNTER — Ambulatory Visit: Payer: Self-pay

## 2024-06-26 VITALS — BP 114/68 | HR 74 | Temp 98.0°F | Wt 183.0 lb

## 2024-06-26 DIAGNOSIS — Z20822 Contact with and (suspected) exposure to covid-19: Secondary | ICD-10-CM

## 2024-06-26 DIAGNOSIS — J0100 Acute maxillary sinusitis, unspecified: Secondary | ICD-10-CM | POA: Diagnosis not present

## 2024-06-26 LAB — POC COVID19 BINAXNOW: SARS Coronavirus 2 Ag: NEGATIVE

## 2024-06-26 MED ORDER — AMOXICILLIN-POT CLAVULANATE 875-125 MG PO TABS: 1.0000 | ORAL_TABLET | Freq: Two times a day (BID) | ORAL | 0 refills | Status: DC

## 2024-06-26 NOTE — Progress Notes (Signed)
 Established Patient Office Visit  Subjective:      CC:  Chief Complaint  Patient presents with   Acute Visit    Reports increased coughing, shortness of breath with exertion, sinus and chest congestion. Symptoms started 4 days ago.    HPI: Andrea Peters is a 77 y.o. female presenting on 06/26/2024 for Acute Visit (Reports increased coughing, shortness of breath with exertion, sinus and chest congestion. Symptoms started 4 days ago.) .  Discussed the use of AI scribe software for clinical note transcription with the patient, who gave verbal consent to proceed.  History of Present Illness Andrea Peters is a 78 year old female with asthma who presents with cough and shortness of breath.  Symptoms began four to five days ago with a persistent cough without sputum production, increased shortness of breath, and sinus congestion accompanied by chest tightness. Facial pain, particularly around the eyes, and a headache were present initially but have since improved. Her voice is hoarser than usual, but there is no ear pain.  She has been using benzonatate  with minimal relief and continues to use her Breo inhaler. There is increased use of her albuterol  inhaler due to exacerbated asthma symptoms, although she does not believe she is wheezing. No leg swelling or fever is present. She feels extremely fatigued, describing herself as 'weak as water' and 'worn out'. Resting is difficult due to coughing.  She experiences similar symptoms approximately twice a year, often related to seasonal changes, and recalls a similar episode in June. She denies recent antibiotic use and has no known allergies except to doxycycline .         Social history:  Relevant past medical, surgical, family and social history reviewed and updated as indicated. Interim medical history since our last visit reviewed.  Allergies and medications reviewed and updated.  DATA REVIEWED: CHART IN  EPIC     ROS: Negative unless specifically indicated above in HPI.    Current Outpatient Medications:    acetaminophen  (TYLENOL ) 325 MG tablet, Take by mouth., Disp: , Rfl:    albuterol  (VENTOLIN  HFA) 108 (90 Base) MCG/ACT inhaler, TAKE 2 PUFFS BY MOUTH EVERY 6 HOURS AS NEEDED FOR WHEEZE OR SHORTNESS OF BREATH, Disp: 18 each, Rfl: 2   allopurinol  (ZYLOPRIM ) 300 MG tablet, TAKE 1 TABLET BY MOUTH EVERY DAY, Disp: 90 tablet, Rfl: 2   amLODipine  (NORVASC ) 10 MG tablet, TAKE 1 TABLET BY MOUTH EVERY DAY, Disp: 90 tablet, Rfl: 1   amoxicillin -clavulanate (AUGMENTIN) 875-125 MG tablet, Take 1 tablet by mouth 2 (two) times daily., Disp: 20 tablet, Rfl: 0   apixaban  (ELIQUIS ) 5 MG TABS tablet, TAKE 1 TABLET TWICE A DAY, Disp: 180 tablet, Rfl: 1   benzonatate  (TESSALON ) 200 MG capsule, Take 1 capsule (200 mg total) by mouth 3 (three) times daily as needed for cough. Swallow whole, Disp: 30 capsule, Rfl: 2   fluorouracil  (EFUDEX ) 5 % cream, Wait two weeks after the biopsy before starting the cream. Apply the cream twice per day to the area where the skin cancer was and a quarter inch around that area. Apply until the redness and irritation develop (usually occurs by day 7), then stop and allow it to heal. Protect the area from sunlight while it is healing with SPF30+ sunscreen., Disp: 30 g, Rfl: 2   fluticasone  (FLONASE ) 50 MCG/ACT nasal spray, SPRAY 2 SPRAYS INTO EACH NOSTRIL EVERY DAY, Disp: 48 mL, Rfl: 1   fluticasone  furoate-vilanterol (BREO ELLIPTA ) 200-25 MCG/ACT AEPB, USE 1  INHALATION ORALLY    DAILY, Disp: 90 each, Rfl: 2   magnesium  oxide (MAG-OX) 400 (240 Mg) MG tablet, TAKE 1 TABLET (400 MG TOTAL) BY MOUTH IN THE MORNING AND AT BEDTIME., Disp: 180 tablet, Rfl: 1   Multiple Vitamin (MULTIVITAMIN) tablet, Take 2 tablets by mouth daily. , Disp: , Rfl:    simvastatin  (ZOCOR ) 20 MG tablet, TAKE 1 TABLET BY MOUTH EVERYDAY AT BEDTIME, Disp: 90 tablet, Rfl: 0   telmisartan -hydrochlorothiazide  (MICARDIS   HCT) 80-25 MG tablet, TAKE 1 TABLET BY MOUTH EVERY DAY, Disp: 90 tablet, Rfl: 1        Objective:        BP 114/68 (BP Location: Left Arm, Patient Position: Sitting, Cuff Size: Large)   Pulse 74   Temp 98 F (36.7 C) (Temporal)   Wt 183 lb (83 kg)   SpO2 93%   BMI 35.15 kg/m   Physical Exam HEENT: Fluid in ear, possible infection  Wt Readings from Last 3 Encounters:  06/26/24 183 lb (83 kg)  02/08/24 186 lb (84.4 kg)  11/21/23 188 lb 2 oz (85.3 kg)    Physical Exam Vitals reviewed.  Constitutional:      General: She is not in acute distress.    Appearance: Normal appearance. She is normal weight. She is not ill-appearing, toxic-appearing or diaphoretic.  HENT:     Head: Normocephalic.     Right Ear: A middle ear effusion is present. Tympanic membrane is erythematous (slight).     Left Ear: Tympanic membrane normal. Tympanic membrane is not erythematous.     Nose:     Left Sinus: Maxillary sinus tenderness present.     Mouth/Throat:     Mouth: Mucous membranes are dry.     Pharynx: Postnasal drip present. No oropharyngeal exudate or posterior oropharyngeal erythema.  Eyes:     Extraocular Movements: Extraocular movements intact.     Pupils: Pupils are equal, round, and reactive to light.  Cardiovascular:     Rate and Rhythm: Normal rate and regular rhythm.     Pulses: Normal pulses.     Heart sounds: Normal heart sounds.  Pulmonary:     Effort: Pulmonary effort is normal.     Breath sounds: Normal breath sounds.  Musculoskeletal:     Cervical back: Normal range of motion.  Neurological:     General: No focal deficit present.     Mental Status: She is alert and oriented to person, place, and time. Mental status is at baseline.  Psychiatric:        Mood and Affect: Mood normal.        Behavior: Behavior normal.        Thought Content: Thought content normal.        Judgment: Judgment normal.           Results LABS   COVID-19 test: Negative  (06/26/2024)  Assessment & Plan:   Assessment and Plan Assessment & Plan Acute upper respiratory infection with asthma exacerbation Acute upper respiratory infection with associated asthma exacerbation, ongoing for 4-5 days. Symptoms include cough, sinus congestion, and increased shortness of breath. No fever reported. COVID-19 test negative. No wheezing on examination. Symptoms not improving with current use of Breo and increased use of albuterol  inhaler. Benzonatate  provides minimal relief. Viral etiology suspected, but bacterial infection not ruled out due to persistent symptoms and possible ear infection. - Prescribe Augmentin for sinus infection, ear infection, and bronchial symptoms. -rx augmentin 875/125 mg po bid x 10 days -  Continue using albuterol  inhaler as needed for shortness of breath. - Continue benzonatate  for cough relief.  Possible acute otitis media, left ear Possible acute otitis media in the left ear, noted as fluid-filled on examination. No significant pain reported. - Prescribe Augmentin for possible ear infection.        Return for f/u PCP if no improvement in symptoms.     Ginger Patrick, MSN, APRN, FNP-C [facility]

## 2024-06-26 NOTE — Telephone Encounter (Signed)
 Appointment made for today 06/26/2024 at 11am with Ginger Patrick FNP  FYI Only or Action Required?: FYI only for provider.  Patient was last seen in primary care on 02/08/2024 by Avelina Greig BRAVO, MD.  Called Nurse Triage reporting Cough.  Symptoms began 4 days ago.  Interventions attempted: Rest, hydration, or home remedies.  Symptoms are: gradually worsening.  Triage Disposition: See HCP Within 4 Hours (Or PCP Triage)  Patient/caregiver understands and will follow disposition?: Yes              Copied from CRM [id number]. Topic: Clinical - Red Word Triage >> Jun 26, 2024  8:13 AM Eva FALCON wrote: Red Word that prompted transfer to Nurse Triage: Pt states she is having her annual asthma cough, taking the pearls that usually help with cough but are not helping. Feels terrible with some trouble breathing. Is feeling very weak makes it hard to even get dressed. Reason for Disposition  [1] MILD difficulty breathing (e.g., minimal/no SOB at rest, SOB with walking, pulse < 100) AND [2] still present when not coughing  Answer Assessment - Initial Assessment Questions Stage 4 cancer  Patient is advised to call us  back if anything changes or with any further questions/concerns. Patient is advised that if anything worsens to go to the Emergency Room. Patient verbalized understanding.    1. ONSET: When did the cough begin?      4 days ago 2. SEVERITY: How bad is the cough today?      continuous 3. SPUTUM: Describe the color of your sputum (e.g., none, dry cough; clear, white, yellow, green)     N/a 4. HEMOPTYSIS: Are you coughing up any blood? If Yes, ask: How much? (e.g., flecks, streaks, tablespoons, etc.)     N/a 5. DIFFICULTY BREATHING: Are you having difficulty breathing? If Yes, ask: How bad is it? (e.g., mild, moderate, severe)      Patient states not difficulty but she does feel fatigued 6. FEVER: Do you have a fever? If Yes, ask: What is your  temperature, how was it measured, and when did it start?     denies 7. CARDIAC HISTORY: Do you have any history of heart disease? (e.g., heart attack, congestive heart failure)      Heart murmur 8. LUNG HISTORY: Do you have any history of lung disease?  (e.g., pulmonary embolus, asthma, emphysema)     asthma 9. PE RISK FACTORS: Do you have a history of blood clots? (or: recent major surgery, recent prolonged travel, bedridden)     A lot of blood clots when I was at [facility] and I am on Eloquis--2 years ago 10. OTHER SYMPTOMS: Do you have any other symptoms? (e.g., runny nose, wheezing, chest pain)       Generalized malaise  Protocols used: Cough - Acute Non-Productive-A-AH

## 2024-07-08 ENCOUNTER — Other Ambulatory Visit: Payer: Self-pay | Admitting: Family Medicine

## 2024-07-10 DIAGNOSIS — C4361 Malignant melanoma of right upper limb, including shoulder: Secondary | ICD-10-CM | POA: Diagnosis not present

## 2024-07-10 DIAGNOSIS — R918 Other nonspecific abnormal finding of lung field: Secondary | ICD-10-CM | POA: Diagnosis not present

## 2024-07-10 DIAGNOSIS — K862 Cyst of pancreas: Secondary | ICD-10-CM | POA: Diagnosis not present

## 2024-07-10 DIAGNOSIS — C7931 Secondary malignant neoplasm of brain: Secondary | ICD-10-CM | POA: Diagnosis not present

## 2024-07-10 DIAGNOSIS — C439 Malignant melanoma of skin, unspecified: Secondary | ICD-10-CM | POA: Diagnosis not present

## 2024-07-14 ENCOUNTER — Other Ambulatory Visit: Payer: Self-pay | Admitting: Family Medicine

## 2024-07-15 DIAGNOSIS — C7931 Secondary malignant neoplasm of brain: Secondary | ICD-10-CM | POA: Diagnosis not present

## 2024-07-15 DIAGNOSIS — R059 Cough, unspecified: Secondary | ICD-10-CM | POA: Diagnosis not present

## 2024-07-15 DIAGNOSIS — Z9049 Acquired absence of other specified parts of digestive tract: Secondary | ICD-10-CM | POA: Diagnosis not present

## 2024-07-15 DIAGNOSIS — Z923 Personal history of irradiation: Secondary | ICD-10-CM | POA: Diagnosis not present

## 2024-07-15 DIAGNOSIS — E876 Hypokalemia: Secondary | ICD-10-CM | POA: Diagnosis not present

## 2024-07-15 DIAGNOSIS — R5383 Other fatigue: Secondary | ICD-10-CM | POA: Diagnosis not present

## 2024-07-15 DIAGNOSIS — C7802 Secondary malignant neoplasm of left lung: Secondary | ICD-10-CM | POA: Diagnosis not present

## 2024-07-15 DIAGNOSIS — Z9289 Personal history of other medical treatment: Secondary | ICD-10-CM | POA: Diagnosis not present

## 2024-07-15 DIAGNOSIS — C439 Malignant melanoma of skin, unspecified: Secondary | ICD-10-CM | POA: Diagnosis not present

## 2024-07-18 DIAGNOSIS — H43811 Vitreous degeneration, right eye: Secondary | ICD-10-CM | POA: Diagnosis not present

## 2024-07-18 DIAGNOSIS — H25813 Combined forms of age-related cataract, bilateral: Secondary | ICD-10-CM | POA: Diagnosis not present

## 2024-07-18 DIAGNOSIS — H3552 Pigmentary retinal dystrophy: Secondary | ICD-10-CM | POA: Diagnosis not present

## 2024-08-05 ENCOUNTER — Telehealth: Payer: Self-pay | Admitting: *Deleted

## 2024-08-05 NOTE — Telephone Encounter (Signed)
 Copied from CRM #[id number]. Topic: Clinical - Medication Question >> Aug 05, 2024  4:09 PM Andrea Peters wrote: Reason for CRM: Pt called in today to request a stronger medicine than tylenol  for her arthritis in her knees. She states that the pain is getting worse to the point where it is becoming harder to walk and the tylenol  is not really helping anymore. Please call pt to advise

## 2024-08-05 NOTE — Telephone Encounter (Signed)
 The next step would be specialist exam and most likely injections (which can be helpful)  Open to that?  Let me know and I can refer to sport med or orthopedics

## 2024-08-06 NOTE — Telephone Encounter (Signed)
 Sounds good. Thanks

## 2024-08-06 NOTE — Telephone Encounter (Signed)
 Pt advised of Dr. Graham comments. Pt said she is already seeing a specialist with [facility] regarding back problems so she will call them 1st to see if they can advise on her knees also before she agrees to a new specialist. Pt will check in with [facility] and if they can't assist with her knees she will call us  back to proceed with referral but for now pt will hold off on referral   FYI to PCP

## 2024-08-10 ENCOUNTER — Other Ambulatory Visit: Payer: Self-pay | Admitting: Family Medicine

## 2024-08-13 ENCOUNTER — Telehealth: Payer: Self-pay | Admitting: Family Medicine

## 2024-08-13 DIAGNOSIS — G8929 Other chronic pain: Secondary | ICD-10-CM

## 2024-08-13 NOTE — Telephone Encounter (Signed)
 Copied from CRM #[id number]. Topic: Clinical - Medication Question >> Aug 05, 2024  4:09 PM Sasha M wrote: Reason for CRM: Pt called in today to request a stronger medicine than tylenol  for her arthritis in her knees. She states that the pain is getting worse to the point where it is becoming harder to walk and the tylenol  is not really helping anymore. Please call pt to advise >> Aug 13, 2024  8:42 AM Rosina BIRCH wrote: Patient called stating she would like to get the referral done for her knee [phone]

## 2024-08-13 NOTE — Telephone Encounter (Signed)
 From previous phone note /had offered ortho referral   Pt advised of Dr. Graham comments. Pt said she is already seeing a specialist with [facility] regarding back problems so she will call them 1st to see if they can advise on her knees also before she agrees to a new specialist. Pt will check in with [facility] and if they can't assist with her knees she will call us  back to proceed with referral but for now pt will hold off on referral    FYI to PCP      I will get on a referral  Does she want [facility] or another location?

## 2024-08-13 NOTE — Addendum Note (Signed)
 Addended by: RANDEEN HARDY A on: 08/13/2024 07:05 PM   Modules accepted: Orders

## 2024-08-13 NOTE — Telephone Encounter (Signed)
 Pt checked with her [facility] specialist and they said PCP has to manage knee pain. Pt would like to see an Ortho in [place] (doesn't have to be [facility]) if possible she would like to stay with in [place] for the specialist. Pt advise PCP will place referral and she should get a call/letter/mychart within 2 weeks if not let us  know

## 2024-08-13 NOTE — Telephone Encounter (Signed)
 I put the referral in for orthopedics  Please let us  know if you don't hear in 1-2 weeks to set that up (mychart message or call or letter)

## 2024-08-15 ENCOUNTER — Ambulatory Visit: Admitting: Physician Assistant

## 2024-08-15 ENCOUNTER — Ambulatory Visit

## 2024-08-15 DIAGNOSIS — M1712 Unilateral primary osteoarthritis, left knee: Secondary | ICD-10-CM

## 2024-08-15 DIAGNOSIS — M25562 Pain in left knee: Secondary | ICD-10-CM | POA: Diagnosis not present

## 2024-08-15 MED ORDER — TRIAMCINOLONE ACETONIDE 40 MG/ML IJ SUSP
40.0000 mg | INTRAMUSCULAR | Status: AC | PRN
  Administered 2024-08-15: 40 mg via INTRA_ARTICULAR

## 2024-08-15 NOTE — Progress Notes (Signed)
 Chief Complaint: Left knee pain    History of Present Illness:    Andrea Peters is a 78 y.o. female presents with 3 to 4-week history of left knee pain.  States symptoms began after walking around [place] on concrete floor.  Located primarily over medial aspect.  Describes as constant ache/soreness.  Exacerbated with weightbearing.  Associated sensation of instability, however has not buckled or fallen.  Mild stiffness.  Had some radiating pain down anterior aspect of lower leg/tibia, however has since resolved.  Has taken over-the-counter Tylenol  with minimal symptom improvement.  Has applied ice, Biofreeze, and Salonpas with minimal help.  Patient states pain will wake her up at night.  Denies leg weakness or paresthesias.  Patient using a single-point cane for ambulation assistance, however states she was doing that prior to left knee pain onset.  No previous history of left knee issues.  Patient had bone density scan on 03/20/2023, revealed osteoporosis.  On VitD supplement (did not tolerate Alendronate ). 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  Last seen on 07/15/2024.  Patient with chronic back pain and history of L1 compression fracture and diffuse lumbar degenerative disc disease.  On Vicodin for chronic pain management, recent increase in dosage to 7.5/325 mg 3 times daily.  Patient on Eliquis  for previous history of PE, diagnosed in June 2023.  Patient with no diabetes history.  Last A1c 6.3% on 11/13/2023, 9 months ago.     PMH/PSH/Social History/Family History/Allergies/Meds:   Past Medical History:  Diagnosis Date   Allergic rhinitis    Allergy 1964   Arthritis 2010   Asthma 2015   CHOLELITHIASIS, HX OF 11/17/2010   Qualifier: Diagnosis of  By: Randeen MD, Laine Caldron    Dysplastic nevus 02/06/2013   R flank - moderate    Hyperglycemia    Hyperlipidemia    Hypertension    Melanoma (HCC) 08/17/2012   back of right arm   Mixed incontinence    Obesity    Plantar fasciitis    with significant disability   Squamous cell carcinoma in situ 01/11/2024   Right lateral foot. Tx with 5FU/Calcipotriene.   Squamous cell carcinoma of skin    R lat foot - tx with 5FU/Calcipotriene cream   Tendonitis of foot    L, chronic foot pain   Past Surgical History:  Procedure Laterality Date   ABDOMINAL HYSTERECTOMY     fibroids, ovaries intact   APPENDECTOMY     BREAST EXCISIONAL BIOPSY Bilateral 1960's -1970's   benign   CHOLECYSTECTOMY     EYE SURGERY Bilateral 12/2015   at [facility]   TENDON REPAIR  01/2006   Tendon rupture L foot   TUBAL LIGATION     Social History   Socioeconomic History   Marital status: Married    Spouse name: Not on file   Number of children: Not on file   Years of education: Not on file   Highest education level: 12th grade  Occupational History   Not on file  Tobacco Use   Smoking status: Never   Smokeless tobacco: Never  Vaping Use   Vaping status: Never Used  Substance and Sexual Activity   Alcohol use: No   Drug use: No   Sexual activity: Not on file  Other Topics Concern   Not on file  Social History Narrative   Cannot exercise to  chronic foot pain   Social Drivers of Health   Tobacco Use: Low Risk  (07/15/2024)   Received from [facility]   Patient History    Smoking Tobacco Use: Never    Smokeless Tobacco Use: Never    Passive Exposure: Not on file  Financial Resource Strain: Low Risk (08/07/2023)   Overall Financial Resource Strain (CARDIA)    Difficulty of Paying Living Expenses: Not hard at all  Food Insecurity: No Food Insecurity (08/07/2023)   Hunger Vital Sign    Worried About Running Out of Food in the Last Year: Never true    Ran Out of Food in the Last Year: Never true  Transportation Needs: No Transportation Needs (10/18/2023)   PRAPARE  - Administrator, Civil Service (Medical): No    Lack of Transportation (Non-Medical): No  Physical Activity: Inactive (08/07/2023)   Exercise Vital Sign    Days of Exercise per Week: 0 days    Minutes of Exercise per Session: 0 min  Stress: No Stress Concern Present (08/07/2023)   [employer] of Occupational Health - Occupational Stress Questionnaire    Feeling of Stress : Not at all  Social Connections: Moderately Integrated (10/18/2023)   Social Connection and Isolation Panel    Frequency of Communication with Friends and Family: More than three times a week    Frequency of Social Gatherings with Friends and Family: More than three times a week    Attends Religious Services: More than 4 times per year    Active Member of Clubs or Organizations: No    Attends Banker Meetings: Never    Marital Status: Married  Depression (PHQ2-9): Medium Risk (11/21/2023)   Depression (PHQ2-9)    PHQ-2 Score: 7  Alcohol Screen: Low Risk (08/07/2023)   Alcohol Screen    Last Alcohol Screening Score (AUDIT): 0  Housing: Unknown (01/01/2024)   [facility]   Epic    In the last 12 months, was there a time when you were not able to pay the mortgage or rent on time?: No    Number of Times Moved in the Last Year: Not on file    At any time in the past 12 months, were you homeless or living in a shelter (including now)?: No  Utilities: Not At Risk (08/07/2023)   AHC Utilities    Threatened with loss of utilities: No  Health Literacy: Adequate Health Literacy (08/07/2023)   B1300 Health Literacy    Frequency of need for help with medical instructions: Never   Family History  Problem Relation Age of Onset   Heart attack Mother    Other Mother        ?thyroid  problems   Lung cancer Father    Allergies[1] Current Outpatient Medications  Medication Sig Dispense Refill   acetaminophen  (TYLENOL ) 325 MG tablet Take by mouth.     albuterol   (VENTOLIN  HFA) 108 (90 Base) MCG/ACT inhaler TAKE 2 PUFFS BY MOUTH EVERY 6 HOURS AS NEEDED FOR WHEEZE OR SHORTNESS OF BREATH 18 each 2   allopurinol  (ZYLOPRIM ) 300 MG tablet TAKE 1 TABLET BY MOUTH EVERY DAY 90 tablet 2   amLODipine  (NORVASC ) 10 MG tablet TAKE 1 TABLET BY MOUTH EVERY DAY 90 tablet 0   amoxicillin -clavulanate (AUGMENTIN ) 875-125 MG tablet Take 1 tablet by mouth 2 (two) times daily. 20 tablet 0   apixaban  (ELIQUIS ) 5 MG TABS tablet TAKE 1 TABLET TWICE A DAY 180 tablet 1  benzonatate  (TESSALON ) 200 MG capsule Take 1 capsule (200 mg total) by mouth 3 (three) times daily as needed for cough. Swallow whole 30 capsule 2   fluorouracil  (EFUDEX ) 5 % cream Wait two weeks after the biopsy before starting the cream. Apply the cream twice per day to the area where the skin cancer was and a quarter inch around that area. Apply until the redness and irritation develop (usually occurs by day 7), then stop and allow it to heal. Protect the area from sunlight while it is healing with SPF30+ sunscreen. 30 g 2   fluticasone  (FLONASE ) 50 MCG/ACT nasal spray SPRAY 2 SPRAYS INTO EACH NOSTRIL EVERY DAY 48 mL 0   fluticasone  furoate-vilanterol (BREO ELLIPTA ) 200-25 MCG/ACT AEPB USE 1 INHALATION ORALLY    DAILY 90 each 0   magnesium  oxide (MAG-OX) 400 (240 Mg) MG tablet TAKE 1 TABLET (400 MG TOTAL) BY MOUTH IN THE MORNING AND AT BEDTIME. 180 tablet 1   Multiple Vitamin (MULTIVITAMIN) tablet Take 2 tablets by mouth daily.      simvastatin  (ZOCOR ) 20 MG tablet TAKE 1 TABLET BY MOUTH EVERYDAY AT BEDTIME 90 tablet 0   telmisartan -hydrochlorothiazide  (MICARDIS  HCT) 80-25 MG tablet TAKE 1 TABLET BY MOUTH EVERY DAY 90 tablet 1   No current facility-administered medications for this visit.   No results found.  I have reviewed past medical, surgical, social and family history, medications, and allergies as documented in the EMR.   Review of Systems:    A ROS was performed including pertinent positives and  negatives as documented in the HPI.   Physical Exam :    General/Constitutional: NAD and appears stated age Vascular: No edema, swelling or tenderness, except as noted in detailed exam Integumentary: No impressive skin lesions present, except as noted in detailed exam Neurological: Alert and oriented Psych: Appropriate affect and cooperative Musculoskeletal: Normal, except as noted in detailed exam and HPI There were no vitals taken for this visit.   Bilateral leg inspection reveals brawny skin discoloration, most prominent distally, consistent with venous insufficiency. Mild diffuse/circumferential edema. No erythema. No calf distension, firmness, tenderness with palpation. Minimal diffuse varicose veins.   Focused Orthopedic Exam:    Knee Examination (focused):   LEFT  AROM (degrees) PROM (degrees)  0-120 0-120  Palpation (pain): Effusion    Medial joint line tenderness positive   Lateral joint line tenderness positive  Instability: Varus @ 0 degrees            @ 30 degrees none none   Valgus @ 0 degrees             @ 30 degrees none none  Special Tests: Lachman's negative   Posterior drawer none   Anterior drawer none   Pivot shift Not performed   McMurray negative   Dial @ 30 degrees         @ 90 degrees Not performed Not performed  Patella: Palpation (pain) negative   Mobility < 2 quadrants   Apprehension negative  Other: Knee flexion strength  5/5   Knee extension strength  5/5   Mildly antalgic gait. Using single point cane for ambulation assistance.  Vascular/Lymphatic: 2+ dorsalis pedis/posterior tibialis pulses, foot warm and well perfused Neurologic: Sensation intact to light touch to Superficial peroneal/Deep peroneal/Tibial/Sural/Saphenous nerves     Imaging:    Xray (Left Knee): Xray including 4-views (AP, lateral, tunnel, skyline) obtained today 08/15/2024 at [facility] were reviewed personally by me. Per my  independent interpretation these images show no acute fracture or dislocation.  Mild medial joint space loss.  Mild subchondral sclerosis along tibial plateau.  Calcification at quadriceps tendon attachment to patella.  No significant soft tissue abnormality      Assessment and Plan:    78 y.o. female was seen and examined in office today. We reviewed patient's history, examination, and imaging in detail. Based on information available for this encounter, patient with 3 to 4-week history of left knee pain.  Began after walking a fair amount of time and distance on concrete floor at [employer].  Pain located at medial joint line.  Point tenderness on physical exam.  Left knee x-ray/imaging performed in office today reveals minimal medial joint space loss.  Discussed findings with patient.  Suspect pain currently from left knee osteoarthritis flareup.  Discussed treatment options.  Conservative management includes rest, Ace wrap/compression sleeve, ice/heat, stretching exercises, referral to formal physical therapy, prescription anti-inflammatories, corticosteroid injection.  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  Patient given [facility] knee conditioning program handout.  Discussed risks of injection, including increased risk of infection given patient's immunocompromise state with battling stage IV melanoma.  Patient states she has tolerated oral prednisone  well in the recent past.  Patient provided with left knee intra-articular corticosteroid injection.  Patient tolerated procedure well.  Discussed slight increased risk of bleeding given patient on Eliquis , no issues status post injection, applied Band-Aid.  Advised patient to schedule follow-up with clinic in 6 weeks for reevaluation, to return sooner if any new/worsening symptoms or concerns.   Lucie Stabs PA-C Orthopedic Surgery & Sports Medicine [facility]  with [facility]   This document was dictated using Dragon voice recognition software. A reasonable attempt at proof reading has been made to minimize errors.   Large Joint Inj: L knee on 08/15/2024 11:33 AM Indications: pain Details: 22 G 1.5 in needle, anterolateral approach Medications: 40 mg triamcinolone acetonide 40 MG/ML (4cc Ropivicaine 0.5%) Outcome: tolerated well, no immediate complications Procedure, treatment alternatives, risks and benefits explained, specific risks discussed. Consent was given by the patient. Immediately prior to procedure a time out was called to verify the correct patient, procedure, equipment, support staff and site/side marked as required. Patient was prepped and draped in the usual sterile fashion.    Procedure: The risks and benefits of a cortisone injection were discussed and the patient wishes to proceed.  The anterolateral compartment of the left knee was cleansed with alcohol swabs and ChloraPrep.  The skin was flash cooled with Ethyl Chloride, and using sterile technique, a 20 gauge needle was immediately introduced into the joint.  After aspiration revealed a flash of clear yellow tinged joint fluid the injectate which consisted of 1cc of 40 mg/mL of Kenalog and 4cc of 0.5% ropivacaine easily flowed into the joint.  The needle was withdrawn and pressure was applied for hemostasis.  A bandage was applied.  The patient tolerated the procedure well. Patient instructed to ice the area tonight if sore.  Cortisone Injections You have received a cortisone injection today. This injection consists of a numbing medication and cortisone.  There may be side effects after receiving the injection.   If you are diabetic: Your blood sugar may increase for up to 48 hours.  Check it more often than normal.   General reactions: A cortisone flare reaction. This generally occurs 6-8 hours after receiving the injection.  Ice the area  and take Tylenol  for pain.  If the  pain lasts longer than 48 hours call the office.   Some patients may experience flushing, increased heart rate, red face or increase in body temperature.  This is rare but can happen.   Over the counter Benadryl , if appropriate, often reduces these symptoms.  For a severe reaction contact your family doctor or go to the emergency room.  If you have any other questions, feel free to ask.      [1]  Allergies Allergen Reactions   Alendronate      Hair loss  Nausea Diarrhea    Doxycycline  Other (See Comments)    Joint Swelling/Bright Red Skin on Lower Legs   Lipitor [Atorvastatin ]     Muscle pain   Naproxen Sodium    Bioflavonoids Rash   Iodinated Contrast Media Rash and Swelling    On the evening after CT scan, mild rash noted on bilateral lower legs stopping at ankles, not itchy, along with mild swelling of bilateral lower legs/ankles/feet   Naproxen Other (See Comments) and Rash    Severe indigestion

## 2024-08-15 NOTE — Patient Instructions (Signed)
 [facility] [address], [place] [address] [phone]   Thank you for visiting the office today. We appreciate your trust and allowing us  to help you with your orthopedic needs. We have discussed orthopedic transition of care at today's appointment. If you have any difficulties with referral or follow up, please notify the office.  If you experience life-threatening symptoms or it cannot wait until normal office hours, please go to the nearest Emergency Department for immediate evaluation.      Cortisone Injection Patient Information  -A cortisone injection has been recommended for you today during your office visit. The injection consists of two medications administered at the same time. The first medication is a numbing medication. This medication will help you to feel better today for a couple hours.  However, this medication will wear off today and your discomfort may return. The second medication is the steroid medication. This medication will usually take a few days for full effect.   -Some individuals, about 1 in 20, can have an increase in their pain after the injection for approximately 24-36 hours. This is called a steroid flare and may be associated with some slight flushing of the face. The best thing to do if this occurs is to ice the area as much as possible and take ibuprofen  if you are able.   -Any patients who are currently receiving treatment for problems with their blood sugar should be aware that this medication may cause you to have elevated blood sugar. This will typically occur for 24-36 hours after the injection. We recommend that you check your blood sugar regularly during this time and contact your primary care provider immediately or go to the nearest emergency room if the levels get to high.

## 2024-09-02 ENCOUNTER — Other Ambulatory Visit: Payer: Self-pay | Admitting: Family Medicine

## 2024-09-03 ENCOUNTER — Other Ambulatory Visit: Payer: Self-pay | Admitting: *Deleted

## 2024-09-03 DIAGNOSIS — I2699 Other pulmonary embolism without acute cor pulmonale: Secondary | ICD-10-CM

## 2024-09-03 MED ORDER — APIXABAN 5 MG PO TABS: 5.0000 mg | ORAL_TABLET | Freq: Two times a day (BID) | ORAL | 1 refills | Status: AC

## 2024-09-06 ENCOUNTER — Telehealth: Payer: Self-pay | Admitting: *Deleted

## 2024-09-06 NOTE — Telephone Encounter (Signed)
 Pt notified of Dr. Graham comments and pt advised to get checked out at an UC since it's the weekend and we have no appts available on Monday

## 2024-09-06 NOTE — Telephone Encounter (Signed)
 I cannot send in anything without evaluation  Sorry  Schedule with first available or UC

## 2024-09-06 NOTE — Telephone Encounter (Signed)
 Copied from CRM [id number]. Topic: Clinical - Medication Question >> Sep 06, 2024  1:43 PM Sasha M wrote: Reason for CRM: Pt called in today because she has been having some cough with chest and head congestion for 3 days. She would like to request some medication to be sent to [id number] [address], [facility] - [address] [place] [address] Phone: [phone] Fax: [phone] Hours: Not open 24 hours  She states that last time she got like this she ended up really sick and just wants to see if Dr Randeen would send in something to beat it before it gets too bad. I did inform her that she may need to be seen for an appt first. Please call pt to advise

## 2024-09-12 ENCOUNTER — Ambulatory Visit: Admitting: Family Medicine

## 2024-09-12 ENCOUNTER — Encounter: Payer: Self-pay | Admitting: Family Medicine

## 2024-09-12 VITALS — BP 120/68 | HR 64 | Temp 98.2°F | Ht 60.5 in | Wt 183.5 lb

## 2024-09-12 DIAGNOSIS — W19XXXA Unspecified fall, initial encounter: Secondary | ICD-10-CM | POA: Diagnosis not present

## 2024-09-12 DIAGNOSIS — R051 Acute cough: Secondary | ICD-10-CM

## 2024-09-12 DIAGNOSIS — S0081XA Abrasion of other part of head, initial encounter: Secondary | ICD-10-CM | POA: Insufficient documentation

## 2024-09-12 DIAGNOSIS — Y92009 Unspecified place in unspecified non-institutional (private) residence as the place of occurrence of the external cause: Secondary | ICD-10-CM | POA: Diagnosis not present

## 2024-09-12 DIAGNOSIS — S8012XA Contusion of left lower leg, initial encounter: Secondary | ICD-10-CM | POA: Diagnosis not present

## 2024-09-12 DIAGNOSIS — J4531 Mild persistent asthma with (acute) exacerbation: Secondary | ICD-10-CM | POA: Diagnosis not present

## 2024-09-12 LAB — POC COVID19 BINAXNOW: SARS Coronavirus 2 Ag: NEGATIVE

## 2024-09-12 MED ORDER — BENZONATATE 200 MG PO CAPS: 200.0000 mg | ORAL_CAPSULE | Freq: Three times a day (TID) | ORAL | 2 refills | Status: AC | PRN

## 2024-09-12 MED ORDER — PREDNISONE 10 MG PO TABS: ORAL_TABLET | ORAL | 0 refills | Status: DC

## 2024-09-12 NOTE — Assessment & Plan Note (Signed)
 From fall on 12/31- glasses pressed into her face Scab w/o signs if infection and no bony tenderness  Discussed keeping clean with soap and water Watch for concussion symptoms-none or vision change  Call back and Er precautions noted in detail today

## 2024-09-12 NOTE — Assessment & Plan Note (Signed)
 Dry hacking cough for about a week  Suspect due to dust allergies but cannot r/o uri (if so, mild)  Some exp wheeze on exam See a/p for asthma exac   Prednisone  taper 40  Tessalon  200 g   Low threshold for CXR if not improving (aware of LLL nodular opacity on last CT and next CT planned next mo)   Update if not starting to improve in a week or if worsening  Call back and Er precautions noted in detail today

## 2024-09-12 NOTE — Patient Instructions (Addendum)
 Practice lifting your feet  Get back to your PT exercises   Strength training is so important   If you want another referral for physical therapy let us  know    Take the prednisone  40 mg taper for cough (may make you hyper and hungry)  Continue your breo inhaler  Update if not starting to improve in a week or if worsening    Watch for breathing changes/ fever or other symptoms    Keep abrasion on brow clean with soap and water  The bruising on your leg will take a long time to go away   Watch for headache or dizziness

## 2024-09-12 NOTE — Assessment & Plan Note (Signed)
 Pt turned quickly taking down xmas decorations on porch and lost her balance Contusion arm and LLE- with hematoma/gradually improving Small abrasion left brow from glasses No concussion symptoms   Discussed fall prevention and handout given Plans to return to her PT home exercise program Offered another referral if needed

## 2024-09-12 NOTE — Assessment & Plan Note (Signed)
 Unsure if flared by dust allergies or mild uri  Reassuring exam  Persistent dry cough /some wheeze on force exp Encouraged to continue the breo  Prescription prednisone  40 mg sent-reviewed side effects  Refilled tessalon  pearles for cough Aware of lung finding on CT- low threshold to do cxr if not improving  Has follow up CT next month   Update if not starting to improve in a week or if worsening  Call back and Er precautions noted in detail today

## 2024-09-12 NOTE — Progress Notes (Signed)
 "  Subjective:    Patient ID: Andrea Peters, female    DOB: November 26, 1945, 79 y.o.   MRN: [id number]  HPI  Wt Readings from Last 3 Encounters:  09/12/24 183 lb 8 oz (83.2 kg)  06/26/24 183 lb (83 kg)  02/08/24 186 lb (84.4 kg)   35.25 kg/m  Vitals:   09/12/24 1149 09/12/24 1216  BP: (!) 152/86 120/68  Pulse: 64   Temp: 98.2 F (36.8 C)   SpO2: 96%      Pt presents for c/o  Dry cough Asthma issues  Injuries from a fall   Cough started about a week ago  Worse to lie down  Not productive  Not a lot of wheezing -minimal  No shortness of breath   No fever  No nasal symptoms  Throat is raw from coughing   Asthma -mild persistent Uses breo ellipta  200-25 one inhalation daily Tends to get flared by dust Also exercise    Has history of lung scarring from radiation for melanoma in past  Last CT chest/abd was in early nov Impression:  1. No metastatic disease in the abdomen and pelvis.  2.  New 5 mm posterior right lower lobe nodular opacity is indeterminate  and could represent focal scar or infection; metastatic disease is not  excluded. Recommend attention on follow-up.  3.  Bandlike consolidation in the left lower lobe consistent with  postradiation changes.  4.  Stable pancreatic body cystic lesion, likely a side branch IPMN.   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   Anemia 10/19/2023    Dyspepsia 10/19/2023   Caregiver stress 06/28/2023   Mild persistent asthma with exacerbation 03/22/2023   Knee pain 10/20/2022   Bursitis of left elbow 10/20/2022   Heart murmur 10/20/2022   Dysuria 05/05/2022   L1 vertebral fracture (HCC) 03/15/2022   Fall at home, initial encounter 03/15/2022   Cancer associated pain 03/15/2022   Hypomagnesemia 03/09/2022   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   Subclinical hypothyroidism 03/02/2021   Prediabetes 10/26/2018   Cough 03/29/2016   Fatigue 03/29/2016   Osteopenia 10/19/2015   Estrogen deficiency 10/15/2014   Melanoma of upper arm (HCC) 05/08/2013   Gout 11/17/2010   HYPERCHOLESTEROLEMIA, PURE 05/17/2007   Allergic rhinitis 01/23/2007   Essential hypertension 12/20/2006   Past Medical History:  Diagnosis Date   Allergic rhinitis    Allergy 1964   Arthritis 2010   Asthma 2015   CHOLELITHIASIS, HX OF 11/17/2010   Qualifier: Diagnosis of  By: Randeen MD, Laine Caldron    Dysplastic nevus 02/06/2013   R flank - moderate   Hyperglycemia    Hyperlipidemia    Hypertension    Melanoma (HCC) 08/17/2012   back of right arm   Mixed incontinence    Obesity    Plantar fasciitis    with  significant disability   Squamous cell carcinoma in situ 01/11/2024   Right lateral foot. Tx with 5FU/Calcipotriene.   Squamous cell carcinoma of skin    R lat foot - tx with 5FU/Calcipotriene cream   Tendonitis of foot    L, chronic foot pain   Past Surgical History:  Procedure Laterality Date   ABDOMINAL HYSTERECTOMY     fibroids, ovaries intact   APPENDECTOMY     BREAST EXCISIONAL BIOPSY Bilateral 1960's -1970's   benign   CHOLECYSTECTOMY     EYE SURGERY Bilateral 12/2015   at [facility]   TENDON REPAIR  01/2006   Tendon rupture L foot   TUBAL LIGATION     Social History[1] Family History  Problem Relation Age of Onset   Heart attack Mother    Other Mother        ?thyroid  problems   Lung cancer Father    Allergies[2] Medications Ordered Prior to  Encounter[3]  Review of Systems  Constitutional:  Negative for activity change, appetite change, fatigue, fever and unexpected weight change.  HENT:  Positive for postnasal drip. Negative for congestion, ear pain, rhinorrhea, sinus pressure and sore throat.   Eyes:  Negative for pain, redness and visual disturbance.  Respiratory:  Positive for cough, chest tightness and wheezing. Negative for choking and shortness of breath.   Cardiovascular:  Negative for chest pain and palpitations.  Gastrointestinal:  Negative for abdominal pain, blood in stool, constipation and diarrhea.  Endocrine: Negative for polydipsia and polyuria.  Genitourinary:  Negative for dysuria, frequency and urgency.  Musculoskeletal:  Negative for arthralgias, back pain and myalgias.  Skin:  Negative for pallor and rash.       Scratch on brow area   Allergic/Immunologic: Negative for environmental allergies.  Neurological:  Negative for dizziness, syncope and headaches.  Hematological:  Negative for adenopathy. Bruises/bleeds easily.  Psychiatric/Behavioral:  Negative for decreased concentration and dysphoric mood. The patient is not nervous/anxious.        Objective:   Physical Exam Constitutional:      General: She is not in acute distress.    Appearance: Normal appearance. She is well-developed. She is obese. She is not ill-appearing or diaphoretic.  HENT:     Head: Normocephalic and atraumatic.     Right Ear: Tympanic membrane and ear canal normal.     Left Ear: Tympanic membrane and ear canal normal.     Nose: No congestion.     Comments: Boggy nares     Mouth/Throat:     Mouth: Mucous membranes are moist.     Pharynx: Oropharynx is clear.  Eyes:     General:        Right eye: No discharge.        Left eye: No discharge.     Conjunctiva/sclera: Conjunctivae normal.     Pupils: Pupils are equal, round, and reactive to light.  Neck:     Thyroid : No thyromegaly.     Vascular: No carotid bruit or JVD.   Cardiovascular:     Rate and Rhythm: Normal rate and regular rhythm.     Heart sounds: Normal heart sounds.     No gallop.  Pulmonary:     Effort: Pulmonary effort is normal. No respiratory distress.     Breath sounds: No stridor. Wheezing present. No rhonchi or rales.     Comments: Wheeze on forced exp  No rales or crackles  No prolonged exp phase Moderate persistent dry cough Abdominal:  General: There is no distension or abdominal bruit.     Palpations: Abdomen is soft.  Musculoskeletal:     Cervical back: Normal range of motion and neck supple.     Right lower leg: No edema.     Left lower leg: No edema.     Comments: Large contusion /hematoma of LLE below knee Non tender   Small resolving contusion left upper arm    Lymphadenopathy:     Cervical: No cervical adenopathy.  Skin:    General: Skin is warm and dry.     Coloration: Skin is not pale.     Findings: No rash.     Comments: Small scab on left lateral brow Scant resolving ecchymosis  Non tender   Neurological:     Mental Status: She is alert.     Cranial Nerves: No cranial nerve deficit.     Motor: No weakness.     Coordination: Coordination normal.     Gait: Gait normal.     Deep Tendon Reflexes: Reflexes are normal and symmetric. Reflexes normal.  Psychiatric:        Mood and Affect: Mood normal.           Assessment & Plan:   Problem List Items Addressed This Visit       Respiratory   Mild persistent asthma with exacerbation   Unsure if flared by dust allergies or mild uri  Reassuring exam  Persistent dry cough /some wheeze on force exp Encouraged to continue the breo  Prescription prednisone  40 mg sent-reviewed side effects  Refilled tessalon  pearles for cough Aware of lung finding on CT- low threshold to do cxr if not improving  Has follow up CT next month   Update if not starting to improve in a week or if worsening  Call back and Er precautions noted in detail today         Relevant Medications   predniSONE  (DELTASONE ) 10 MG tablet     Other   Fall at home, initial encounter   Pt turned quickly taking down [employer] on porch and lost her balance Contusion arm and LLE- with hematoma/gradually improving Small abrasion left brow from glasses No concussion symptoms   Discussed fall prevention and handout given Plans to return to her PT home exercise program Offered another referral if needed         Cough - Primary   Dry hacking cough for about a week  Suspect due to dust allergies but cannot r/o uri (if so, mild)  Some exp wheeze on exam See a/p for asthma exac   Prednisone  taper 40  Tessalon  200 g   Low threshold for CXR if not improving (aware of LLL nodular opacity on last CT and next CT planned next mo)   Update if not starting to improve in a week or if worsening  Call back and Er precautions noted in detail today        Relevant Orders   POC COVID-19 (Completed)   Contusion of left leg   From fall on 12/31 On anticoagulation   Large ecchymosis LLE -non tender  Discussed timeline for this to improve Will monitor   Call back and Er precautions noted in detail today         Abrasion, face w/o infection   From fall on 12/31- glasses pressed into her face Scab w/o signs if infection and no bony tenderness  Discussed keeping clean with soap and water Watch for concussion  symptoms-none or vision change  Call back and Er precautions noted in detail today           [1]  Social History Tobacco Use   Smoking status: Never   Smokeless tobacco: Never  Vaping Use   Vaping status: Never Used  Substance Use Topics   Alcohol use: No   Drug use: No  [2]  Allergies Allergen Reactions   Alendronate      Hair loss  Nausea Diarrhea    Doxycycline  Other (See Comments)    Joint Swelling/Bright Red Skin on Lower Legs   Lipitor [Atorvastatin ]     Muscle pain   Naproxen Sodium    Bioflavonoids Rash   Iodinated Contrast  Media Rash and Swelling    On the evening after CT scan, mild rash noted on bilateral lower legs stopping at ankles, not itchy, along with mild swelling of bilateral lower legs/ankles/feet   Naproxen Other (See Comments) and Rash    Severe indigestion  [3]  Current Outpatient Medications on File Prior to Visit  Medication Sig Dispense Refill   acetaminophen  (TYLENOL ) 325 MG tablet Take by mouth.     albuterol  (VENTOLIN  HFA) 108 (90 Base) MCG/ACT inhaler TAKE 2 PUFFS BY MOUTH EVERY 6 HOURS AS NEEDED FOR WHEEZE OR SHORTNESS OF BREATH 18 each 2   allopurinol  (ZYLOPRIM ) 300 MG tablet TAKE 1 TABLET BY MOUTH EVERY DAY 90 tablet 0   amLODipine  (NORVASC ) 10 MG tablet TAKE 1 TABLET BY MOUTH EVERY DAY 90 tablet 0   apixaban  (ELIQUIS ) 5 MG TABS tablet Take 1 tablet (5 mg total) by mouth 2 (two) times daily. 180 tablet 1   fluorouracil  (EFUDEX ) 5 % cream Wait two weeks after the biopsy before starting the cream. Apply the cream twice per day to the area where the skin cancer was and a quarter inch around that area. Apply until the redness and irritation develop (usually occurs by day 7), then stop and allow it to heal. Protect the area from sunlight while it is healing with SPF30+ sunscreen. 30 g 2   fluticasone  (FLONASE ) 50 MCG/ACT nasal spray SPRAY 2 SPRAYS INTO EACH NOSTRIL EVERY DAY 48 mL 0   fluticasone  furoate-vilanterol (BREO ELLIPTA ) 200-25 MCG/ACT AEPB USE 1 INHALATION ORALLY    DAILY 90 each 0   magnesium  oxide (MAG-OX) 400 (240 Mg) MG tablet TAKE 1 TABLET (400 MG TOTAL) BY MOUTH IN THE MORNING AND AT BEDTIME. 180 tablet 1   Multiple Vitamin (MULTIVITAMIN) tablet Take 2 tablets by mouth daily.      simvastatin  (ZOCOR ) 20 MG tablet TAKE 1 TABLET BY MOUTH EVERYDAY AT BEDTIME 90 tablet 0   telmisartan -hydrochlorothiazide  (MICARDIS  HCT) 80-25 MG tablet TAKE 1 TABLET BY MOUTH EVERY DAY 90 tablet 1   No current facility-administered medications on file prior to visit.   "

## 2024-09-12 NOTE — Assessment & Plan Note (Signed)
 From fall on 12/31 On anticoagulation   Large ecchymosis LLE -non tender  Discussed timeline for this to improve Will monitor   Call back and Er precautions noted in detail today

## 2024-09-23 ENCOUNTER — Other Ambulatory Visit: Payer: Self-pay | Admitting: Family Medicine

## 2024-09-23 ENCOUNTER — Telehealth: Payer: Self-pay

## 2024-09-23 DIAGNOSIS — R058 Other specified cough: Secondary | ICD-10-CM | POA: Insufficient documentation

## 2024-09-23 DIAGNOSIS — J4531 Mild persistent asthma with (acute) exacerbation: Secondary | ICD-10-CM

## 2024-09-23 MED ORDER — PREDNISONE 10 MG PO TABS: ORAL_TABLET | ORAL | 0 refills | Status: DC

## 2024-09-23 NOTE — Telephone Encounter (Signed)
 I want to get a chest xray Order is in  Please have her come in for that   Sent in 30 mg prednisone  taper  If worse let us  know   Mucinex DM over the counter may help cough and phlegm Keep up fluids also

## 2024-09-23 NOTE — Telephone Encounter (Signed)
 Copied from CRM #[id number]. Topic: Clinical - Medical Advice >> Sep 23, 2024  9:18 AM Lonell PEDLAR wrote: Reason for CRM: Patient was placed on Prednisone  taper when she was seen on 09/12/24. She states she took her last dose this morning, and while she feels better, she does not feel 100% herself again and is wondering if prednisone  can be resent to pharmacy.  She also mentioned having a similar episode in the fall and being placed on antibiotics. Please review and advise. [phone]

## 2024-09-23 NOTE — Telephone Encounter (Signed)
 Pt notified of Dr. Graham comments and verbalized understanding. Pt will stop by tomorrow to get xray

## 2024-09-23 NOTE — Telephone Encounter (Signed)
 Cough, wheezing, tight chest or all of those?  How much ?   Fever? Phlegm ?   Thanks

## 2024-09-23 NOTE — Telephone Encounter (Signed)
 Pt said she is only coughing. Pt said the cough did get a little better but never went away. She started coughing while on the phone and it sounds deep and barky. Pt said it feels like she needs to cough something up but nothing comes out so it's not productive and no phlegm. Pt said she doesn't have a fever and she isn't wheezing however when she lays down and starts coughing she feels like she may be wheezing a little.   [facility] [address]

## 2024-09-24 ENCOUNTER — Ambulatory Visit (INDEPENDENT_AMBULATORY_CARE_PROVIDER_SITE_OTHER)
Admission: RE | Admit: 2024-09-24 | Discharge: 2024-09-24 | Disposition: A | Source: Ambulatory Visit | Attending: Family Medicine | Admitting: Family Medicine

## 2024-09-24 ENCOUNTER — Ambulatory Visit: Payer: Self-pay | Admitting: Family Medicine

## 2024-09-24 DIAGNOSIS — J4531 Mild persistent asthma with (acute) exacerbation: Secondary | ICD-10-CM

## 2024-09-24 DIAGNOSIS — R058 Other specified cough: Secondary | ICD-10-CM

## 2024-09-24 MED ORDER — PREDNISONE 10 MG PO TABS: ORAL_TABLET | ORAL | 0 refills | Status: AC

## 2024-09-24 MED ORDER — AZITHROMYCIN 250 MG PO TABS: ORAL_TABLET | ORAL | 0 refills | Status: AC

## 2024-09-24 MED ORDER — PREDNISONE 10 MG PO TABS: ORAL_TABLET | ORAL | 0 refills | Status: DC

## 2024-09-24 NOTE — Addendum Note (Signed)
 Addended by: RAYLEEN GLENDORA HERO on: 09/24/2024 10:50 AM   Modules accepted: Orders

## 2024-09-24 NOTE — Telephone Encounter (Unsigned)
 Copied from CRM [id number]. Topic: Clinical - Prescription Issue >> Sep 24, 2024 10:07 AM Kevelyn M wrote: Reason for CRM: patient calling because her prescription for predniSONE  (DELTASONE ) 10 MG tablet was sent to the wrong location. It needs to be sent to the location below:  [id number] [address], [facility] [address], [place] [address] Phone: [phone]  Fax: [phone]

## 2024-09-24 NOTE — Telephone Encounter (Signed)
 Rx keeps printed so Rx faxed to [facility] [address]

## 2024-09-24 NOTE — Addendum Note (Signed)
 Addended by: RAYLEEN GLENDORA HERO on: 09/24/2024 10:53 AM   Modules accepted: Orders

## 2024-09-25 ENCOUNTER — Other Ambulatory Visit: Payer: Self-pay | Admitting: Family Medicine

## 2024-09-26 ENCOUNTER — Ambulatory Visit

## 2024-09-26 VITALS — BP 122/68 | HR 85 | Ht 60.5 in | Wt 184.0 lb

## 2024-09-26 DIAGNOSIS — S8002XA Contusion of left knee, initial encounter: Secondary | ICD-10-CM

## 2024-09-26 DIAGNOSIS — S8012XA Contusion of left lower leg, initial encounter: Secondary | ICD-10-CM

## 2024-09-26 DIAGNOSIS — M25562 Pain in left knee: Secondary | ICD-10-CM

## 2024-09-26 NOTE — Progress Notes (Signed)
 "  Office Visit Note   Patient: Andrea Peters           Date of Birth: Oct 27, 1945           MRN: [id number] Visit Date: 09/26/2024            PCP: Randeen Laine LABOR, MD   Assessment & Plan: Visit Diagnoses:  1. Contusion of knee and lower leg, left, initial encounter   2. Acute pain of left knee     Plan: Natural history and expected course discussed. Questions answered. Recommend rest and ice. Recommend acetaminophen  as needed for pain. Follow up prn  Orders:  Orders Placed This Encounter  Procedures   DG Knee 1-2 Views Left     Subjective: Chief Complaint: Left knee pain  HPI Patient is a 79 y.o. year old female who presents for a follow up appointment for the left knee. Patient's symptoms are improved since last visit. Patient had steroid injection which resolved pain. Patient reports that she fell recently onto her left knee and now has pain in the front of the knee. No other complaints.  Objective: Vital Signs: BP 122/68   Pulse 85   Ht 5' 0.5 (1.537 m)   Wt 83.5 kg   BMI 35.34 kg/m   Physical Exam Gen: Alert, No Acute Distress left knee: Skin intact, no erythema or induration noted. anterior tenderness to palpation. Ecchymosis diffusely throughout knee and left leg. Range of motion 0 to 120. No instability with varus or valgus stress.   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    Subclinical hypothyroidism 03/02/2021   Prediabetes 10/26/2018   Cough 03/29/2016   Fatigue 03/29/2016   Osteopenia 10/19/2015   Estrogen deficiency 10/15/2014   Melanoma of upper arm (HCC) 05/08/2013   Gout 11/17/2010   HYPERCHOLESTEROLEMIA, PURE 05/17/2007   Allergic rhinitis 01/23/2007   Essential hypertension 12/20/2006   Past Medical History:  Diagnosis Date   Allergic rhinitis    Allergy 1964   Arthritis 2010   Asthma 2015   CHOLELITHIASIS, HX OF 11/17/2010   Qualifier: Diagnosis of  By: Randeen MD, Laine Caldron    Dysplastic nevus 02/06/2013   R flank - moderate   Hyperglycemia    Hyperlipidemia    Hypertension    Melanoma (HCC) 08/17/2012   back of right arm   Mixed incontinence    Obesity    Plantar fasciitis    with significant disability   Squamous cell carcinoma in situ 01/11/2024   Right lateral foot. Tx with 5FU/Calcipotriene.   Squamous cell carcinoma of skin    R lat foot - tx with 5FU/Calcipotriene cream   Tendonitis of foot    L, chronic foot pain    Family History  Problem Relation Age of Onset   Heart attack Mother    Other Mother        ?thyroid  problems   Lung cancer  Father     Past Surgical History:  Procedure Laterality Date   ABDOMINAL HYSTERECTOMY     fibroids, ovaries intact   APPENDECTOMY     BREAST EXCISIONAL BIOPSY Bilateral 1960's -1970's   benign   CHOLECYSTECTOMY     EYE SURGERY Bilateral 12/2015   at [facility]   TENDON REPAIR  01/2006   Tendon rupture L foot   TUBAL LIGATION     Social History   Occupational History   Not on file  Tobacco Use   Smoking status: Never   Smokeless tobacco: Never  Vaping Use   Vaping status: Never Used  Substance and Sexual Activity   Alcohol use: No   Drug use: No   Sexual activity: Not on file   Current Outpatient Medications  Medication Instructions   acetaminophen  (TYLENOL ) 325 MG tablet Take by mouth.   albuterol  (VENTOLIN  HFA) 108 (90 Base) MCG/ACT inhaler TAKE 2 PUFFS BY MOUTH EVERY  6 HOURS AS NEEDED FOR WHEEZE OR SHORTNESS OF BREATH   allopurinol  (ZYLOPRIM ) 300 mg, Oral, Daily   amLODipine  (NORVASC ) 10 mg, Oral, Daily   apixaban  (ELIQUIS ) 5 mg, Oral, 2 times daily   azithromycin  (ZITHROMAX ) 250 MG tablet Take 2 tablets on day 1, then 1 tablet daily on days 2 through 5   benzonatate  (TESSALON ) 200 mg, Oral, 3 times daily PRN, Swallow whole   fluorouracil  (EFUDEX ) 5 % cream Wait two weeks after the biopsy before starting the cream. Apply the cream twice per day to the area where the skin cancer was and a quarter inch around that area. Apply until the redness and irritation develop (usually occurs by day 7), then stop and allow it to heal. Protect the area from sunlight while it is healing with SPF30+ sunscreen.   fluticasone  (FLONASE ) 50 MCG/ACT nasal spray SPRAY 2 SPRAYS INTO EACH NOSTRIL EVERY DAY   fluticasone  furoate-vilanterol (BREO ELLIPTA ) 200-25 MCG/ACT AEPB USE 1 INHALATION ORALLY    DAILY   magnesium  oxide (MAG-OX) 400 (240 Mg) MG tablet TAKE 1 TABLET (400 MG TOTAL) BY MOUTH IN THE MORNING AND AT BEDTIME.   Multiple Vitamin (MULTIVITAMIN) tablet 2 tablets, Daily   predniSONE  (DELTASONE ) 10 MG tablet Take 3 pills once daily by mouth for 3 days, then 2 pills once daily for 3 days, then 1 pill once daily for 3 days and then stop   simvastatin  (ZOCOR ) 20 MG tablet TAKE 1 TABLET BY MOUTH EVERYDAY AT BEDTIME   telmisartan -hydrochlorothiazide  (MICARDIS  HCT) 80-25 MG tablet 1 tablet, Oral, Daily   Allergies as of 09/26/2024 - Review Complete 09/12/2024  Allergen Reaction Noted   Alendronate   03/02/2021   Doxycycline  Other (See Comments) 02/08/2024   Lipitor [atorvastatin ]  03/29/2013   Naproxen sodium  01/06/2009   Bioflavonoids Rash 12/24/2012   Iodinated contrast media Rash and Swelling 02/21/2023   Naproxen Other (See Comments) and Rash 12/24/2012    "

## 2024-09-30 ENCOUNTER — Ambulatory Visit

## 2024-09-30 VITALS — BP 122/68 | Ht 60.05 in | Wt 180.0 lb

## 2024-09-30 DIAGNOSIS — Z0000 Encounter for general adult medical examination without abnormal findings: Secondary | ICD-10-CM

## 2024-09-30 NOTE — Progress Notes (Signed)
 " I connected with  Andrea Peters Dec on 09/30/24 by a audio enabled telemedicine application and verified that I am speaking with the correct person using two identifiers.  Patient Location: Home  Provider Location: Home Office  Persons Participating in Visit: Patient.  I discussed the limitations of evaluation and management by telemedicine. The patient expressed understanding and agreed to proceed.  Vital Signs: Because this visit was a virtual/telehealth visit, some criteria may be missing or patient reported. Any vitals not documented were not able to be obtained and vitals that have been documented are patient reported.;e Chief Complaint  Patient presents with   Medicare Wellness     Subjective:   Andrea Peters is a 79 y.o. female who presents for a Medicare Annual Wellness Visit.  Visit info / Clinical Intake: Medicare Wellness Visit Type:: Subsequent Annual Wellness Visit Persons participating in visit and providing information:: patient Medicare Wellness Visit Mode:: Telephone If telephone:: video declined Since this visit was completed virtually, some vitals may be partially provided or unavailable. Missing vitals are due to the limitations of the virtual format.: Documented vitals are patient reported If Telephone or Video please confirm:: I connected with patient using audio/video enable telemedicine. I verified patient identity with two identifiers, discussed telehealth limitations, and patient agreed to proceed. Patient Location:: home Provider Location:: home office Interpreter Needed?: No Pre-visit prep was completed: yes AWV questionnaire completed by patient prior to visit?: yes Date:: 09/30/24 Living arrangements:: (Patient-Rptd) lives with spouse/significant other Patient's Overall Health Status Rating: (Patient-Rptd) good Typical amount of pain: (Patient-Rptd) some Does pain affect daily life?: (!) (Patient-Rptd) yes Are you currently prescribed  opioids?: no  Dietary Habits and Nutritional Risks How many meals a day?: (Patient-Rptd) 3 Eats fruit and vegetables daily?: (Patient-Rptd) yes Most meals are obtained by: (Patient-Rptd) preparing own meals; eating out  Functional Status Activities of Daily Living (to include ambulation/medication): (!) (Patient-Rptd) Needs Assist Ambulation: Independent with device- listed below Home Assistive Devices/Equipment: Cane Medication Administration: Independent Home Management (perform basic housework or laundry): (Patient-Rptd) Needs assistance (comment) Manage your own finances?: (Patient-Rptd) yes Primary transportation is: (Patient-Rptd) family / friends Concerns about vision?: no *vision screening is required for WTM* Concerns about hearing?: no  Fall Screening Falls in the past year?: (Patient-Rptd) 1 Number of falls in past year: (Patient-Rptd) 0 Was there an injury with Fall?: (Patient-Rptd) 1 Fall Risk Category Calculator: (Patient-Rptd) 2 Patient Fall Risk Level: (Patient-Rptd) Moderate Fall Risk  Fall Risk Patient at Risk for Falls Due to: History of fall(s); Impaired mobility; Impaired balance/gait Fall risk Follow up: Falls evaluation completed  Home and Transportation Safety: All rugs have non-skid backing?: (Patient-Rptd) yes All stairs or steps have railings?: (Patient-Rptd) yes Grab bars in the bathtub or shower?: (Patient-Rptd) yes Have non-skid surface in bathtub or shower?: (Patient-Rptd) yes Good home lighting?: (Patient-Rptd) yes Regular seat belt use?: (Patient-Rptd) yes Hospital stays in the last year:: (Patient-Rptd) no  Cognitive Assessment Difficulty concentrating, remembering, or making decisions? : (Patient-Rptd) no Will 6CIT or Mini Cog be Completed: yes What year is it?: 0 points What month is it?: 0 points Give patient an address phrase to remember (5 components): rememeber apple , table , and penny About what time is it?: 0 points Count  backwards from 20 to 1: 0 points Say the months of the year in reverse: 0 points Repeat the address phrase from earlier: 0 points 6 CIT Score: 0 points  Advance Directives (For Healthcare) Does Patient Have a Medical Advance Directive?:  Yes Does patient want to make changes to medical advance directive?: No - Patient declined Type of Advance Directive: Healthcare Power of [place]; Out of facility DNR (pink MOST or yellow form); Living will Copy of Healthcare Power of Attorney in Chart?: No - copy requested Copy of Living Will in Chart?: No - copy requested Out of facility DNR (pink MOST or yellow form) in Chart? (Ambulatory ONLY): No - copy requested  Reviewed/Updated  Reviewed/Updated: Reviewed All (Medical, Surgical, Family, Medications, Allergies, Care Teams, Patient Goals)    Allergies (verified) Alendronate , Doxycycline , Lipitor [atorvastatin ], Naproxen sodium, Bioflavonoids, Iodinated contrast media, and Naproxen   Current Medications (verified) Outpatient Encounter Medications as of 09/30/2024  Medication Sig   acetaminophen  (TYLENOL ) 325 MG tablet Take by mouth.   albuterol  (VENTOLIN  HFA) 108 (90 Base) MCG/ACT inhaler TAKE 2 PUFFS BY MOUTH EVERY 6 HOURS AS NEEDED FOR WHEEZE OR SHORTNESS OF BREATH   allopurinol  (ZYLOPRIM ) 300 MG tablet TAKE 1 TABLET BY MOUTH EVERY DAY   amLODipine  (NORVASC ) 10 MG tablet TAKE 1 TABLET BY MOUTH EVERY DAY   apixaban  (ELIQUIS ) 5 MG TABS tablet Take 1 tablet (5 mg total) by mouth 2 (two) times daily.   benzonatate  (TESSALON ) 200 MG capsule Take 1 capsule (200 mg total) by mouth 3 (three) times daily as needed for cough. Swallow whole   fluorouracil  (EFUDEX ) 5 % cream Wait two weeks after the biopsy before starting the cream. Apply the cream twice per day to the area where the skin cancer was and a quarter inch around that area. Apply until the redness and irritation develop (usually occurs by day 7), then stop and allow it to heal. Protect the area  from sunlight while it is healing with SPF30+ sunscreen.   fluticasone  (FLONASE ) 50 MCG/ACT nasal spray SPRAY 2 SPRAYS INTO EACH NOSTRIL EVERY DAY   fluticasone  furoate-vilanterol (BREO ELLIPTA ) 200-25 MCG/ACT AEPB USE 1 INHALATION ORALLY    DAILY   magnesium  oxide (MAG-OX) 400 (240 Mg) MG tablet TAKE 1 TABLET (400 MG TOTAL) BY MOUTH IN THE MORNING AND AT BEDTIME.   Multiple Vitamin (MULTIVITAMIN) tablet Take 2 tablets by mouth daily.    predniSONE  (DELTASONE ) 10 MG tablet Take 3 pills once daily by mouth for 3 days, then 2 pills once daily for 3 days, then 1 pill once daily for 3 days and then stop   simvastatin  (ZOCOR ) 20 MG tablet TAKE 1 TABLET BY MOUTH EVERYDAY AT BEDTIME   telmisartan -hydrochlorothiazide  (MICARDIS  HCT) 80-25 MG tablet TAKE 1 TABLET BY MOUTH EVERY DAY   No facility-administered encounter medications on file as of 09/30/2024.    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 Tx with 5FU/Calcipotriene.   Squamous cell carcinoma of skin    R lat foot - tx with 5FU/Calcipotriene cream   Tendonitis of foot    L, chronic foot pain   Past Surgical History:  Procedure Laterality Date  ABDOMINAL HYSTERECTOMY     fibroids, ovaries intact   APPENDECTOMY     BREAST EXCISIONAL BIOPSY Bilateral 1960's -1970's   benign   CHOLECYSTECTOMY     EYE SURGERY [facility] 12/2015   [facility]   TENDON REPAIR  01/2006   Tendon rupture L foot   TUBAL LIGATION     Family History  Problem Relation Age of Onset   Heart attack  Mother    Other Mother        ?thyroid  problems   Lung cancer Father    Social History   Occupational History   Not on file  Tobacco Use   Smoking status: Never   Smokeless tobacco: Never  Vaping Use   Vaping status: Never Used  Substance and Sexual Activity   Alcohol use: Never   Drug use: No   Sexual activity: Not Currently   Tobacco Counseling Counseling given: Not Answered  SDOH Screenings   Food Insecurity: No Food Insecurity (09/30/2024)  Housing: Low Risk (09/30/2024)  Transportation Needs: No Transportation Needs (09/30/2024)  Utilities: Not At Risk (09/30/2024)  Alcohol Screen: Low Risk (08/07/2023)  Depression (PHQ2-9): Low Risk (09/30/2024)  Recent Concern: Depression (PHQ2-9) - Medium Risk (09/12/2024)  Financial Resource Strain: Low Risk (09/30/2024)  Physical Activity: Patient Declined (09/30/2024)  Social Connections: Socially Integrated (09/30/2024)  Stress: No Stress Concern Present (09/30/2024)  Tobacco Use: Low Risk (09/30/2024)  Health Literacy: Adequate Health Literacy (09/30/2024)   See flowsheets for full screening details  Depression Screen PHQ 2 & 9 Depression Scale- Over the past 2 weeks, how often have you been bothered by any of the following problems? Little interest or pleasure in doing things: 0 Feeling down, depressed, or hopeless (PHQ Adolescent also includes...irritable): 0 PHQ-2 Total Score: 0 Trouble falling or staying asleep, or sleeping too much: 1 Feeling tired or having little energy: 0 Poor appetite or overeating (PHQ Adolescent also includes...weight loss): 0 Feeling bad about yourself - or that you are a failure or have let yourself or your family down: 0 Trouble concentrating on things, such as reading the newspaper or watching television (PHQ Adolescent also includes...like school work): 0 Moving or speaking so slowly that other people could have noticed. Or the opposite - being so fidgety or restless that you have been moving around a  lot more than usual: 1 (moving because of a fall) Thoughts that you would be better off dead, or of hurting yourself in some way: 0 PHQ-9 Total Score: 2 If you checked off any problems, how difficult have these problems made it for you to do your work, take care of things at home, or get along with other people?: Not difficult at all  Depression Treatment Depression Interventions/Treatment : EYV7-0 Score <4 Follow-up Not Indicated     Goals Addressed               This Visit's Progress     keep cancer down (pt-stated)               Objective:    Today's Vitals   09/30/24 1503  BP: 122/68  Weight: 180 lb (81.6 kg)  Height: 5' 0.05 (1.525 m)   Body mass index is 35.1 kg/m.  Hearing/Vision screen Hearing Screening - Comments:: Some difficulties no hearing aids  Vision Screening - Comments:: Patient wears glasses. Pt sees Dr. Lucio  Immunizations and Health Maintenance Health Maintenance  Topic Date Due   DTaP/Tdap/Td (3 - Tdap) 01/29/2018   COVID-19 Vaccine (3 -  Pfizer risk series) 12/24/2019   Medicare Annual Wellness (AWV)  08/06/2024   Influenza Vaccine  12/03/2024 (Originally 04/05/2024)   Zoster Vaccines- Shingrix (1 of 2) 02/20/2025 (Originally 08/18/1965)   Mammogram  12/04/2024   Pneumococcal Vaccine: 50+ Years  Completed   Bone Density Scan  Completed   Meningococcal B Vaccine  Aged Out   Colonoscopy  Discontinued   Hepatitis C Screening  Discontinued        Assessment/Plan:  This is a routine wellness examination for Nariah.  Patient Care Team: Tower, Laine LABOR, MD as PCP - General Tommas Lenis, MD as Referring Physician (General Surgery) Jackquline Sawyer, MD as Consulting Physician (Dermatology) Verdia Art, MD as Consulting Physician (Pulmonary Disease) Materin, Emil LABOR, MD as Referring Physician (Ophthalmology) Maldonado, Ramiro, MD as Referring Physician (Ophthalmology)  I have personally reviewed and noted the following in the  patients chart:   Medical and social history Use of alcohol, tobacco or illicit drugs  Current medications and supplements including opioid prescriptions. Functional ability and status Nutritional status Physical activity Advanced directives List of other physicians Hospitalizations, surgeries, and ER visits in previous 12 months Vitals Screenings to include cognitive, depression, and falls Referrals and appointments  No orders of the defined types were placed in this encounter.  In addition, I have reviewed and discussed with patient certain preventive protocols, quality metrics, and best practice recommendations. A written personalized care plan for preventive services as well as general preventive health recommendations were provided to patient.   Lyle MARLA Right, [place]   09/30/2024   No follow-ups on file.  After Visit Summary: (MyChart) Due to this being a telephonic visit, the after visit summary with patients personalized plan was offered to patient via MyChart   Nurse Notes: no concerns patient declined vaccinations will check with PCP "

## 2024-09-30 NOTE — Patient Instructions (Signed)
 Andrea Peters,  Thank you for taking the time for your Medicare Wellness Visit. I appreciate your continued commitment to your health goals. Please review the care plan we discussed, and feel free to reach out if I can assist you further.  Please note that Annual Wellness Visits do not include a physical exam. Some assessments may be limited, especially if the visit was conducted virtually. If needed, we may recommend an in-person follow-up with your provider.  Ongoing Care Seeing your primary care provider every 3 to 6 months helps us  monitor your health and provide consistent, personalized care.   Referrals If a referral was made during today's visit and you haven't received any updates within two weeks, please contact the referred provider directly to check on the status.  Recommended Screenings:  Health Maintenance  Topic Date Due   DTaP/Tdap/Td vaccine (3 - Tdap) 01/29/2018   COVID-19 Vaccine (3 - Pfizer risk series) 12/24/2019   Medicare Annual Wellness Visit  08/06/2024   Flu Shot  12/03/2024*   Zoster (Shingles) Vaccine (1 of 2) 02/20/2025*   Breast Cancer Screening  12/04/2024   Pneumococcal Vaccine for age over 1  Completed   Osteoporosis screening with Bone Density Scan  Completed   Meningitis B Vaccine  Aged Out   Colon Cancer Screening  Discontinued   Hepatitis C Screening  Discontinued  *Topic was postponed. The date shown is not the original due date.       09/30/2024    7:24 AM  Advanced Directives  Does Patient Have a Medical Advance Directive? Yes  Type of Estate Agent of [place];Out of facility DNR (pink MOST or yellow form);Living will  Does patient want to make changes to medical advance directive? No - Patient declined  Copy of Healthcare Power of Attorney in Chart? No - copy requested    Vision: Annual vision screenings are recommended for early detection of glaucoma, cataracts, and diabetic retinopathy. These exams can also reveal  signs of chronic conditions such as diabetes and high blood pressure.  Dental: Annual dental screenings help detect early signs of oral cancer, gum disease, and other conditions linked to overall health, including heart disease and diabetes.  Please see the attached documents for additional preventive care recommendations.

## 2025-10-08 ENCOUNTER — Ambulatory Visit
